# Patient Record
Sex: Male | Born: 1945
Health system: Southern US, Community
[De-identification: ages and names within clinical notes are randomized; demographics above are authoritative.]

## PROBLEM LIST (undated history)

## (undated) DIAGNOSIS — Z91199 Patient's noncompliance with other medical treatment and regimen due to unspecified reason: Secondary | ICD-10-CM

## (undated) DIAGNOSIS — K5790 Diverticulosis of intestine, part unspecified, without perforation or abscess without bleeding: Secondary | ICD-10-CM

## (undated) DIAGNOSIS — K254 Chronic or unspecified gastric ulcer with hemorrhage: Secondary | ICD-10-CM

## (undated) DIAGNOSIS — I2510 Atherosclerotic heart disease of native coronary artery without angina pectoris: Secondary | ICD-10-CM

## (undated) DIAGNOSIS — Z955 Presence of coronary angioplasty implant and graft: Secondary | ICD-10-CM

## (undated) DIAGNOSIS — E785 Hyperlipidemia, unspecified: Secondary | ICD-10-CM

## (undated) DIAGNOSIS — K922 Gastrointestinal hemorrhage, unspecified: Secondary | ICD-10-CM

## (undated) DIAGNOSIS — E119 Type 2 diabetes mellitus without complications: Secondary | ICD-10-CM

## (undated) DIAGNOSIS — D649 Anemia, unspecified: Secondary | ICD-10-CM

## (undated) DIAGNOSIS — Z9119 Patient's noncompliance with other medical treatment and regimen: Secondary | ICD-10-CM

## (undated) DIAGNOSIS — I739 Peripheral vascular disease, unspecified: Secondary | ICD-10-CM

## (undated) DIAGNOSIS — K219 Gastro-esophageal reflux disease without esophagitis: Secondary | ICD-10-CM

## (undated) DIAGNOSIS — K5520 Angiodysplasia of colon without hemorrhage: Secondary | ICD-10-CM

## (undated) DIAGNOSIS — I10 Essential (primary) hypertension: Secondary | ICD-10-CM

## (undated) DIAGNOSIS — Z9289 Personal history of other medical treatment: Secondary | ICD-10-CM

## (undated) HISTORY — DX: Presence of coronary angioplasty implant and graft: Z95.5

## (undated) HISTORY — PX: CORONARY ANGIOPLASTY: SHX604

---

## 1979-04-19 HISTORY — PX: EXCISIONAL HEMORRHOIDECTOMY: SHX1541

## 1999-04-19 DIAGNOSIS — Z9289 Personal history of other medical treatment: Secondary | ICD-10-CM

## 1999-04-19 DIAGNOSIS — K254 Chronic or unspecified gastric ulcer with hemorrhage: Secondary | ICD-10-CM

## 1999-04-19 HISTORY — DX: Personal history of other medical treatment: Z92.89

## 1999-04-19 HISTORY — DX: Chronic or unspecified gastric ulcer with hemorrhage: K25.4

## 1999-08-14 ENCOUNTER — Encounter (HOSPITAL_COMMUNITY): Admission: RE | Admit: 1999-08-14 | Discharge: 1999-11-12 | Payer: Self-pay | Admitting: *Deleted

## 1999-08-26 ENCOUNTER — Ambulatory Visit (HOSPITAL_COMMUNITY): Admission: RE | Admit: 1999-08-26 | Discharge: 1999-08-26 | Payer: Self-pay | Admitting: Internal Medicine

## 1999-08-26 ENCOUNTER — Encounter (INDEPENDENT_AMBULATORY_CARE_PROVIDER_SITE_OTHER): Payer: Self-pay

## 2002-06-13 ENCOUNTER — Encounter (INDEPENDENT_AMBULATORY_CARE_PROVIDER_SITE_OTHER): Payer: Self-pay

## 2002-06-13 ENCOUNTER — Ambulatory Visit (HOSPITAL_COMMUNITY): Admission: RE | Admit: 2002-06-13 | Discharge: 2002-06-13 | Payer: Self-pay | Admitting: *Deleted

## 2004-09-25 ENCOUNTER — Inpatient Hospital Stay (HOSPITAL_COMMUNITY): Admission: AD | Admit: 2004-09-25 | Discharge: 2004-09-27 | Payer: Self-pay | Admitting: Cardiology

## 2010-08-25 ENCOUNTER — Inpatient Hospital Stay (HOSPITAL_COMMUNITY)
Admission: EM | Admit: 2010-08-25 | Discharge: 2010-08-28 | Payer: Self-pay | Source: Home / Self Care | Attending: Internal Medicine | Admitting: Internal Medicine

## 2010-08-26 ENCOUNTER — Encounter (INDEPENDENT_AMBULATORY_CARE_PROVIDER_SITE_OTHER): Payer: Self-pay | Admitting: Internal Medicine

## 2010-08-27 ENCOUNTER — Encounter (INDEPENDENT_AMBULATORY_CARE_PROVIDER_SITE_OTHER): Payer: Self-pay | Admitting: Internal Medicine

## 2010-08-28 ENCOUNTER — Encounter (INDEPENDENT_AMBULATORY_CARE_PROVIDER_SITE_OTHER): Payer: Self-pay | Admitting: Internal Medicine

## 2010-09-02 LAB — GLUCOSE, CAPILLARY
Glucose-Capillary: 182 mg/dL — ABNORMAL HIGH (ref 70–99)
Glucose-Capillary: 265 mg/dL — ABNORMAL HIGH (ref 70–99)
Glucose-Capillary: 186 mg/dL — ABNORMAL HIGH (ref 70–99)
Glucose-Capillary: 226 mg/dL — ABNORMAL HIGH (ref 70–99)
Glucose-Capillary: 186 mg/dL — ABNORMAL HIGH (ref 70–99)
Glucose-Capillary: 205 mg/dL — ABNORMAL HIGH (ref 70–99)
Glucose-Capillary: 181 mg/dL — ABNORMAL HIGH (ref 70–99)
Glucose-Capillary: 107 mg/dL — ABNORMAL HIGH (ref 70–99)
Glucose-Capillary: 193 mg/dL — ABNORMAL HIGH (ref 70–99)
Glucose-Capillary: 202 mg/dL — ABNORMAL HIGH (ref 70–99)
Glucose-Capillary: 145 mg/dL — ABNORMAL HIGH (ref 70–99)
Glucose-Capillary: 77 mg/dL (ref 70–99)

## 2010-09-02 LAB — POCT CARDIAC MARKERS
CKMB, poc: <1 ng/mL — ABNORMAL LOW (ref 1.0–8.0)
Myoglobin, poc: 92.1 ng/mL (ref 12–200)
Troponin i, poc: <0.05 ng/mL (ref 0.00–0.09)

## 2010-09-02 LAB — CROSSMATCH
ABO/RH(D): B POS
Antibody Screen: NEGATIVE
Unit division: 0
Unit division: 0
Unit division: 0
Unit division: 0

## 2010-09-02 LAB — CBC
HCT: 16.4 % — ABNORMAL LOW (ref 39.0–52.0)
HCT: 27.8 % — ABNORMAL LOW (ref 39.0–52.0)
HCT: 25.2 % — ABNORMAL LOW (ref 39.0–52.0)
HCT: 24.8 % — ABNORMAL LOW (ref 39.0–52.0)
Hemoglobin: 5.4 g/dL — CL (ref 13.0–17.0)
Hemoglobin: 9.1 g/dL — ABNORMAL LOW (ref 13.0–17.0)
Hemoglobin: 8.2 g/dL — ABNORMAL LOW (ref 13.0–17.0)
Hemoglobin: 8.1 g/dL — ABNORMAL LOW (ref 13.0–17.0)
MCH: 33.1 pg (ref 26.0–34.0)
MCH: 30.3 pg (ref 26.0–34.0)
MCH: 30.1 pg (ref 26.0–34.0)
MCH: 30.7 pg (ref 26.0–34.0)
MCHC: 32.9 g/dL (ref 30.0–36.0)
MCHC: 32.7 g/dL (ref 30.0–36.0)
MCHC: 32.5 g/dL (ref 30.0–36.0)
MCHC: 32.7 g/dL (ref 30.0–36.0)
MCV: 100.6 fL — ABNORMAL HIGH (ref 78.0–100.0)
MCV: 92.7 fL (ref 78.0–100.0)
MCV: 92.6 fL (ref 78.0–100.0)
MCV: 93.9 fL (ref 78.0–100.0)
Platelets: 222 10*3/uL (ref 150–400)
Platelets: 215 10*3/uL (ref 150–400)
Platelets: 210 10*3/uL (ref 150–400)
Platelets: 232 10*3/uL (ref 150–400)
RBC: 1.63 MIL/uL — ABNORMAL LOW (ref 4.22–5.81)
RBC: 3 MIL/uL — ABNORMAL LOW (ref 4.22–5.81)
RBC: 2.72 MIL/uL — ABNORMAL LOW (ref 4.22–5.81)
RBC: 2.64 MIL/uL — ABNORMAL LOW (ref 4.22–5.81)
RDW: 16.4 % — ABNORMAL HIGH (ref 11.5–15.5)
RDW: 17.6 % — ABNORMAL HIGH (ref 11.5–15.5)
RDW: 17.9 % — ABNORMAL HIGH (ref 11.5–15.5)
RDW: 17.4 % — ABNORMAL HIGH (ref 11.5–15.5)
WBC: 11.5 10*3/uL — ABNORMAL HIGH (ref 4.0–10.5)
WBC: 11.1 10*3/uL — ABNORMAL HIGH (ref 4.0–10.5)
WBC: 8.6 10*3/uL (ref 4.0–10.5)
WBC: 7 10*3/uL (ref 4.0–10.5)

## 2010-09-02 LAB — FOLATE: Folate: 11.5 ng/mL

## 2010-09-02 LAB — URINALYSIS, ROUTINE W REFLEX MICROSCOPIC
Bilirubin Urine: NEGATIVE
Hgb urine dipstick: NEGATIVE
Ketones, ur: NEGATIVE mg/dL
Nitrite: NEGATIVE
Protein, ur: NEGATIVE mg/dL
Specific Gravity, Urine: 1.011 (ref 1.005–1.030)
Urine Glucose, Fasting: NEGATIVE mg/dL
Urobilinogen, UA: 0.2 mg/dL (ref 0.0–1.0)
pH: 6 (ref 5.0–8.0)

## 2010-09-02 LAB — CULTURE, BLOOD (ROUTINE X 2)
Culture  Setup Time: 201201090009
Culture  Setup Time: 201201090009
Culture: NO GROWTH
Culture: NO GROWTH

## 2010-09-02 LAB — D-DIMER, QUANTITATIVE: D-Dimer, Quant: 0.29 ug/mL-FEU (ref 0.00–0.48)

## 2010-09-02 LAB — COMPREHENSIVE METABOLIC PANEL
ALT: 14 U/L (ref 0–53)
AST: 15 U/L (ref 0–37)
Albumin: 3 g/dL — ABNORMAL LOW (ref 3.5–5.2)
Alkaline Phosphatase: 25 U/L — ABNORMAL LOW (ref 39–117)
BUN: 18 mg/dL (ref 6–23)
CO2: 29 mEq/L (ref 19–32)
Calcium: 8 mg/dL — ABNORMAL LOW (ref 8.4–10.5)
Chloride: 102 mEq/L (ref 96–112)
Creatinine, Ser: 1.14 mg/dL (ref 0.4–1.5)
GFR calc Af Amer: >60 mL/min (ref >60)
GFR calc non Af Amer: >60 mL/min (ref >60)
Glucose, Bld: 212 mg/dL — ABNORMAL HIGH (ref 70–99)
Potassium: 4 mEq/L (ref 3.5–5.1)
Sodium: 136 mEq/L (ref 135–145)
Total Bilirubin: 0.9 mg/dL (ref 0.3–1.2)
Total Protein: 5.5 g/dL — ABNORMAL LOW (ref 6.0–8.3)

## 2010-09-02 LAB — PROTIME-INR
INR: 1.1 (ref 0.00–1.49)
Prothrombin Time: 14.4 seconds (ref 11.6–15.2)

## 2010-09-02 LAB — BASIC METABOLIC PANEL
BUN: 15 mg/dL (ref 6–23)
BUN: 17 mg/dL (ref 6–23)
CO2: 25 mEq/L (ref 19–32)
CO2: 29 mEq/L (ref 19–32)
Calcium: 8.6 mg/dL (ref 8.4–10.5)
Calcium: 8.5 mg/dL (ref 8.4–10.5)
Chloride: 102 mEq/L (ref 96–112)
Chloride: 105 mEq/L (ref 96–112)
Creatinine, Ser: 1.15 mg/dL (ref 0.4–1.5)
Creatinine, Ser: 1.15 mg/dL (ref 0.4–1.5)
GFR calc Af Amer: >60 mL/min (ref >60)
GFR calc Af Amer: >60 mL/min (ref >60)
GFR calc non Af Amer: >60 mL/min (ref >60)
GFR calc non Af Amer: >60 mL/min (ref >60)
Glucose, Bld: 315 mg/dL — ABNORMAL HIGH (ref 70–99)
Glucose, Bld: 186 mg/dL — ABNORMAL HIGH (ref 70–99)
Potassium: 4.2 mEq/L (ref 3.5–5.1)
Potassium: 4.2 mEq/L (ref 3.5–5.1)
Sodium: 136 mEq/L (ref 135–145)
Sodium: 140 mEq/L (ref 135–145)

## 2010-09-02 LAB — HEMOGLOBIN A1C
Hgb A1c MFr Bld: 7 % — ABNORMAL HIGH (ref <5.7)
Mean Plasma Glucose: 154 mg/dL — ABNORMAL HIGH (ref <117)

## 2010-09-02 LAB — URINE CULTURE
Colony Count: NO GROWTH
Culture  Setup Time: 201201091229
Culture: NO GROWTH

## 2010-09-02 LAB — CK TOTAL AND CKMB (NOT AT ARMC)
CK, MB: 1.5 ng/mL (ref 0.3–4.0)
CK, MB: 1 ng/mL (ref 0.3–4.0)
CK, MB: 1.1 ng/mL (ref 0.3–4.0)
CK, MB: 1.3 ng/mL (ref 0.3–4.0)
CK, MB: 1.2 ng/mL (ref 0.3–4.0)
Relative Index: 1.4 (ref 0.0–2.5)
Relative Index: 0.8 (ref 0.0–2.5)
Relative Index: 0.9 (ref 0.0–2.5)
Relative Index: 1 (ref 0.0–2.5)
Relative Index: 1 (ref 0.0–2.5)
Total CK: 106 U/L (ref 7–232)
Total CK: 124 U/L (ref 7–232)
Total CK: 124 U/L (ref 7–232)
Total CK: 133 U/L (ref 7–232)
Total CK: 124 U/L (ref 7–232)

## 2010-09-02 LAB — ABO/RH: ABO/RH(D): B POS

## 2010-09-02 LAB — IRON AND TIBC
Iron: 24 ug/dL — ABNORMAL LOW (ref 42–135)
Saturation Ratios: 7 % — ABNORMAL LOW (ref 20–55)
TIBC: 334 ug/dL (ref 215–435)
UIBC: 310 ug/dL

## 2010-09-02 LAB — BRAIN NATRIURETIC PEPTIDE: Pro B Natriuretic peptide (BNP): <30 pg/mL (ref 0.0–100.0)

## 2010-09-02 LAB — LIPID PANEL
Cholesterol: 119 mg/dL (ref 0–200)
HDL: 29 mg/dL — ABNORMAL LOW (ref >39)
LDL Cholesterol: 60 mg/dL (ref 0–99)
Total CHOL/HDL Ratio: 4.1 RATIO
Triglycerides: 148 mg/dL (ref <150)
VLDL: 30 mg/dL (ref 0–40)

## 2010-09-02 LAB — TROPONIN I
Troponin I: 0.02 ng/mL (ref 0.00–0.06)
Troponin I: 0.02 ng/mL (ref 0.00–0.06)
Troponin I: 0.01 ng/mL (ref 0.00–0.06)
Troponin I: 0.02 ng/mL (ref 0.00–0.06)
Troponin I: 0.02 ng/mL (ref 0.00–0.06)

## 2010-09-02 LAB — VITAMIN B12: Vitamin B-12: 261 pg/mL (ref 211–911)

## 2010-09-02 LAB — RAPID URINE DRUG SCREEN, HOSP PERFORMED
Amphetamines: NOT DETECTED
Barbiturates: NOT DETECTED
Benzodiazepines: NOT DETECTED
Cocaine: NOT DETECTED
Opiates: NOT DETECTED
Tetrahydrocannabinol: NOT DETECTED

## 2010-09-02 LAB — TSH: TSH: 0.995 u[IU]/mL (ref 0.350–4.500)

## 2010-09-02 LAB — FERRITIN: Ferritin: 21 ng/mL — ABNORMAL LOW (ref 22–322)

## 2010-09-08 NOTE — Consult Note (Signed)
NAMEADEL, BURCH              ACCOUNT NO.:  [id number]  MEDICAL RECORD NO.:  [id number]          PATIENT TYPE:  INP  LOCATION:  1437                         FACILITY:  [facility]  PHYSICIAN:  Willis Modena, MD     DATE OF BIRTH:  1946/07/14  DATE OF CONSULTATION:  08/27/2010 DATE OF DISCHARGE:                                CONSULTATION   REASON FOR CONSULTATION:  Melena and anemia.  CONSULTING PHYSICIAN:  Conley Canal, MD  CHIEF COMPLAINT:  Shortness of breath.  HISTORY OF PRESENT ILLNESS:  Mr. Selmer is a 65 year old gentleman with multiple medical problems.  For the past few weeks, he has had trouble with progressive weakness, fatigue and shortness of breath.  He also has endorsed black stools over this time frame.  He does use NSAIDs regularly.  He was found to have a hemoglobin of 5.4 which improved to about 8 after blood transfusions.  He did have an endoscopy done in 2003 for melena by Dr. Virginia Rochester, which showed some gastric ulcerations, biopsies of which showed no malignancy or H pylori.  He also endorses having a colonoscopy several years ago, but I cannot find any records of that in our hospital electronic medical record.  Mr. Odor does have chronic constipation and has had no change in his bowel habits.  He has unintentionally lost about 20 pounds over the past couple of months.  He denies any nausea, vomiting or dysphagia.  Past medical history, past surgical history, allergies, medications, family history, social history and review of systems all from dictated note from Dr. Venetia Constable, dictated on August 25, 2010.  I have reviewed and I agree.  PHYSICAL EXAMINATION:  VITAL SIGNS:  Temperature 98.4, heart rate 74, respiratory rate 18, blood pressure 105/63, oxygen saturation 96% on room air. GENERAL:  Mr. Coudriet is in no acute distress, nontoxic appearing. EYES:  Sclerae are anicteric.  Conjunctivae are slightly pale. ENT:  Slightly dry mucous membranes.  No  oropharyngeal lesions. NECK:  Supple without thyromegaly, lymphadenopathy or bruits. LUNGS:  Clear to auscultation without rales, rhonchi or wheeze. HEART:  Regular rhythm.  Normal rate without murmur, rub or gallop. ABDOMEN:  Soft, nontender and nondistended with normoactive bowel sounds.  No liver or splenic enlargement.  No bulging flanks to suggest ascites. NEUROLOGIC:  Diffusely weak and nonfocal without lateralizing signs. SKIN:  No rash or ecchymoses. EXTREMITIES:  There is no peripheral cyanosis, clubbing or edema. LYMPHATICS:  No palpable axillary, submandibular or supraclavicular adenopathy. PSYCHIATRIC:  Normal mood and affect.  LABORATORY STUDIES:  Hemoglobin on admission was 5.4 with an MCV of 100.6.  Current hemoglobin 8.2, white count 8.6, platelet count 210. INR 1.1.  Sodium 140, potassium 4.2, chloride 105, bicarb 29, BUN 17, creatinine 1.15.  Total protein is 5.5, albumin 3.0, total bilirubin 0.9, alk phos 25, AST 15, ALT 14.  Cardiac enzymes negative on multiple levels.  Urinalysis negative.  Ferritin, iron and iron percent saturation are low.  RADIOLOGIC STUDIES:  Chest x-ray done in January shows no acute cardiopulmonary disease.  IMPRESSION:  Mr. Cinquemani is a 64-year gentleman presenting with melena and symptomatic anemia.  He  does use NSAIDs regularly.  Overall constellation of findings is supportive of peptic ulcer disease. However, the presence of unintentional weight loss raises possibility of some type of malignancy.  PLAN: 1. I agree with medical management with IV fluids and proton pump     inhibitor therapy. 2. Proceed with endoscopy today. 3. If endoscopy is unremarkable, he will need a colonoscopy tomorrow.  Thanks again for allowing [place] to participate in Mr. Levick care.     Willis Modena, MD     WO/MEDQ  D:  08/27/2010  T:  08/27/2010  Job:  [id number]  Electronically Signed by Willis Modena  on 09/08/2010 03:27:46 PM

## 2010-09-14 NOTE — Discharge Summary (Signed)
[name], [name]              ACCOUNT NO.:  [id number]  MEDICAL RECORD NO.:  [id number]          PATIENT TYPE:  INP  LOCATION:  1437                         FACILITY:  [facility]  PHYSICIAN:  Calvert Cantor, M.D.     DATE OF BIRTH:  06-15-1946  DATE OF ADMISSION:  08/25/2010 DATE OF DISCHARGE:  08/28/2010                              DISCHARGE SUMMARY   PRIMARY CARE PHYSICIAN:  None.  The patient goes to Urgent Care for his needs.  PRESENTING COMPLAINT:  Shortness of breath on exertion.  DISCHARGE DIAGNOSES: 1. Severe anemia. 2. Peptic ulcer disease, likely secondary to nonsteroidal anti-     inflammatory drugs. 3. Diabetes mellitus type 2.  Patient does not take any medication for     this. 4. Tobacco abuse. 5. History of hypertension in the past. 6. Iron deficiency secondary to chronic gastrointestinal bleed from     peptic ulcers.  DISCHARGE MEDICATIONS: 1. Ferrous sulfate 325 mg b.i.d. 2. Glucophage 500 mg twice a day. 3. Protonix 40 mg daily.  HOSPITAL COURSE:  This is a 65 year old who presented to the ER with a complaint of shortness of breath, who was found to be significantly anemic, with a hemoglobin of 5.4.  Patient was transfused, bringing his hemoglobin up to 9.1.  On discharge today, it is 8.1.  For further workup, he had a GI consult and upper endoscopy was done on August 27, 2010, which revealed multiple erosions in the antrum greater curvature and the proximal stomach.  He also had minimal bulbitis.  No blood was seen.  A colonoscopy was performed today with biopsies.  He was noted to have internal, external hemorrhoids.  No bleeding was noted.  He had a few right-sided diverticula.  He had rectal and distal ascending small polyps, which were biopsied.  Patient is very adamant about going home today.  He is agreeable to take Protonix.  He states he has been taking one iron tablet a day, but is now recommended to increase this to twice a day.  I have  recommended and IV iron infusion prior to discharge, but the patient his declining this and states that he will take his iron appropriately.  I have advised him that he needs to stop all NSAIDs.  Dr. Ewing Schlein has advised him of this as well, and patient has written directions being given to him on discharge.  Patient is recommended to follow up with Dr. Ewing Schlein in 1 month.  Of note, the patient does not have a PCP.  I have recommended that he find one, since he is a diabetic, hypertensive and has a problem with chronic anemia.  Patient's diabetes was noted to be uncontrolled.  He states he does not check his sugars at home and does not take medication for it.  His hemoglobin A1c was found to be 7.0.  He has been receiving insulin along with Glucophage, while in the hospital.  He is being discharged on Glucophage.  He is advised to take a diabetic diet and continue to take his medicine and start checking his sugars.  Patient was on antihypertensives during the hospital stay.  However,  when trending his blood pressures, it seems that he has been more hypotensive than hypertensive, and therefore at this point, I am not discharging him on any blood pressure medication.  I am recommending that he follow up with an outpatient physician and have his blood pressure checked regularly and medication started, if needed.  Further blood work included an iron profile.  He was found to have an iron level of 24, percent saturation was 7.  Ferritin level 21.  Again, patient is refusing IV iron infusion.  Patient also had a lipid profile and HDL is low at 29.  His LDL is 68, cholesterol level is 119.  PHYSICAL EXAMINATION:  ABDOMEN:  Soft, nontender, nondistended.  Bowel sounds positive. LUNGS:  Clear bilaterally. HEART: Regular rate and rhythm.  No murmurs.  Of note, the patient was found to have a moderate atrial septal aneurysm on the 2-D echo that was performed during this hospital stay.   Dr. Venetia Constable, who was following him at the time, did discuss this with the on- call cardiologist, who stated that, at this time, there is nothing that needs to be done for this aneurysm.  It appears that Dr. Venetia Constable spoke with Dr. Lynnea Ferrier.  Patient has been advised to discontinue smoking as well.  FOLLOWUP INSTRUCTIONS:  He is strongly advised to find a primary care physician.  He also needs to follow up with Dr. Ewing Schlein in 1 month.  CONDITION ON DISCHARGE:  Stable.  Please mention echo findings, EF 55% to 60% with no wall motion abnormality.  There was increased relative contribution of atrial contraction to ventricular filling, ascending aorta was mildly dilated, atrial septum revealed a medium sized aneurysm with a free respirophasic mobility between right and left atrial cavities.  Inferior vena cava was noted to be mildly dilated.  Time on discharge was 55 minutes.     Calvert Cantor, M.D.     SR/MEDQ  D:  08/28/2010  T:  08/28/2010  Job:  [id number]  Electronically Signed by Calvert Cantor M.D. on 09/14/2010 02:36:05 PM

## 2011-01-03 NOTE — Cardiovascular Report (Signed)
[name], [name]              ACCOUNT NO.:  [id number]   MEDICAL RECORD NO.:  [id number]          PATIENT TYPE:  INP   LOCATION:  2013                         FACILITY:  [facility]   PHYSICIAN:  Madaline Savage, M.D.DATE OF BIRTH:  04-Nov-1945   DATE OF PROCEDURE:  09/25/2004  DATE OF DISCHARGE:                              CARDIAC CATHETERIZATION   PROCEDURES PERFORMED:  1.  Selective coronary angiography by Judkins technique.  2.  Retrograde left heart catheterization.  3.  Left ventricular angiography.  4.  Abdominal aortography.   COMPLICATIONS:  None.   ENTRY SITE:  Right femoral.   DYE USED:  Omnipaque.   CATHETERS USED:  6-French Judkins diagnostic catheters by [employer]   PATIENT PROFILE:  The patient is a 65 year old African-American gentleman  who presented to an urgent care center yesterday with complaints of chest  pain. His blood pressure was elevated and because of a positive response to  sublingual nitroglycerin, the patient was then sent to our office for  further evaluation. The EKG showed showed what appeared to be a QS pattern  in lead V2 which was consistent with a possible old anteroseptal MI but  there was no evidence of acute ischemia. Because of the patient's family  history of stroke and his elevated blood pressure and his chest pain  response to nitroglycerin, we transferred him to [facility] yesterday where he was  placed on intravenous medication and was brought to the catheterization  laboratory electively this morning.   RESULTS:  PRESSURES: The left ventricular pressure was 140/14, end-diastolic  pressure 17. Central aortic pressure 140/80, mean of 110. No aortic valve  gradient by pullback technique.   ANGIOGRAPHIC RESULTS:  The patient had a fairly long left main coronary  artery large in diameter and with no lesions seen.   The LAD coursed to the cardiac apex and gave rise to a single, fairly large  diagonal branch and a normal LAD as well.   There was an intermediate ramus branch off the left main coronary artery  moderate in size and normal in appearance.   The circumflex coronary artery bifurcated distally and was nondominant and  showed no lesions.   The right coronary artery was large and dominant with a single acute  marginal branch and a posterolateral and a posterior descending branch that  were both fairly large with no lesions noted.   Left ventricular angiography showed normal contractility without wall  segment abnormalities. Ejection fraction estimate 50-60% and no evidence of  mitral regurgitation.   Abdominal aortography showed no evidence of renal artery stenosis and the  abdominal aorta was normal.   FINAL IMPRESSIONS:  1.  Recent chest pain of anginal quality relieved by nitroglycerin.  2.  Elevated blood pressure without adequate control.  3.  Angiographically patent coronary arteries with a right coronary dominant      system.  4.  Normal left ventricular systolic function, ejection fraction 50-60%.  5.  Normal renal arteries.   PLAN:  The patient will be scheduled for a CT of the chest to look for  aortic dissection and we will  continue to optimize his blood pressure for  better hypertensive control.      WHG/MEDQ  D:  09/26/2004  T:  09/26/2004  Job:  [id number]   cc:   Cath Lab

## 2011-01-03 NOTE — Discharge Summary (Signed)
NAMEWILBORN, MEMBRENO              ACCOUNT NO.:  [id number]   MEDICAL RECORD NO.:  [id number]          PATIENT TYPE:  INP   LOCATION:  2013                         FACILITY:  [facility]   PHYSICIAN:  Madaline Savage, M.D.DATE OF BIRTH:  [birth year]   DATE OF ADMISSION:  09/25/2004  DATE OF DISCHARGE:  09/27/2004                                 DISCHARGE SUMMARY   DISCHARGE DIAGNOSIS:  1.  Chest pain consistent with unstable angina, catheterization this      admission revealing normal coronaries and normal left ventricle      function.  2.  Hypertension.  3.  History of a gastrointestinal bleeding in the past.   HOSPITAL COURSE:  The patient is a 65 year old male who was referred to [place]  by [facility] for evaluation of chest pain.  He has not had prior  coronary disease.  He developed chest tightness across his chest at work.  He was referred to [place] from [facility].  We saw him in the office.  He did receive a nitroglycerin at [facility] with relief of his  symptoms.  He was admitted to telemetry and set up for diagnostic  catheterization.  The patient was started on heparin and nitrates.  Enzymes  were negative.  Catheterization, done September 26, 2004, showed normal LV  function, normal coronaries, normal renal arteries, and normal iliacs.  Avapro was added as well as beta-blocker for his hypertension.  He did have  a spiral CT, prior to discharge, that was negative for pulmonary embolism.  We feel he can be discharged September 27, 2004.  He will follow up with Dr.  Elsie Lincoln.   DISCHARGE MEDICATIONS:  1.  Avapro 150 mg a day.  2.  Metoprolol 50 mg b.i.d.  3.  Baby aspirin q.d.  4.  Prilosec over-the-counter once a day.  5.  Iron as taken at home.   LABS:  White count 4.3, hemoglobin 11.4 hematocrit 35.2, platelets 369.  Sodium 135, potassium 4.0, BUN 19, creatinine 1.2. TSH was normal.  CK-MB  and troponins were negative.  Liver functions were normal.  A lipid  profile  revealed cholesterol 160, triglycerides 125, HDL 38, LDL 97.  Chest x-ray  shows clear lung fields, ectatic aorta.  EKG shows sinus rhythm without  acute changes.   DISPOSITION:  The patient discharged in stable condition and will follow-up  with Dr. Elsie Lincoln as an outpatient.      LKK/MEDQ  D:  09/27/2004  T:  09/27/2004  Job:  [id number]   cc:   [facility]

## 2011-01-03 NOTE — Op Note (Signed)
   NAMEHANNA, [name]                          ACCOUNT NO.:  [id number]   MEDICAL RECORD NO.:  [id number]                   PATIENT TYPE:  AMB   LOCATION:  ENDO                                 FACILITY:  [facility]   PHYSICIAN:  Georgiana Spinner, M.D.                 DATE OF BIRTH:  08/04/1946   DATE OF PROCEDURE:  DATE OF DISCHARGE:                                 OPERATIVE REPORT   PROCEDURE:  Upper endoscopy.   INDICATIONS FOR PROCEDURE:  Patient presenting with melena and NSAID use  presumably has NSAID induced ulceration.   ANESTHESIA:  Demerol 60, Versed 6 mg.   DESCRIPTION OF PROCEDURE:  With the patient mildly sedated in the left  lateral decubitus position, the Olympus videoscopic endoscope was inserted  in the mouth and passed under direct vision through the esophagus which  appeared normal. We advanced into the stomach. The fundus and body appeared  normal. The antrum showed ulcerations in the prepyloric area which were  photographed and biopsied. They appeared to be benign. We entered into the  duodenal bulb and second portion of the duodenum. Mild duodenitis was seen  in the bulb only. From this point, the endoscope was slowly withdrawn taking  circumferential views of the entire duodenal mucosa until the endoscope was  pulled back into the stomach, placed in retroflexion to view the stomach  from below. The endoscope was then straightened and withdrawn taking  circumferential views of the remaining gastric and esophageal mucosa. The  patient's vital signs and pulse oximeter remained stable. The patient  tolerated the procedure well without apparent complications.   FINDINGS:  Ulceration of antrum of stomach. Await biopsy report. The patient  will call me for results and followup with me as an outpatient. Continue  PPI, avoidance of NSAIDs for the time being.                                                Georgiana Spinner, M.D.    GMO/MEDQ  D:  06/13/2002  T:   06/13/2002  Job:  [id number]   cc:   Harrel Lemon. Merla Riches, M.D.  [address]  [place]  [place] [address]  Fax: [phone]

## 2011-11-11 ENCOUNTER — Ambulatory Visit (INDEPENDENT_AMBULATORY_CARE_PROVIDER_SITE_OTHER): Payer: Medicare Other | Admitting: Family Medicine

## 2011-11-11 ENCOUNTER — Ambulatory Visit: Payer: Medicare Other

## 2011-11-11 VITALS — BP 157/83 | HR 78 | Temp 98.8°F | Resp 18 | Ht 68.0 in | Wt 206.8 lb

## 2011-11-11 DIAGNOSIS — I10 Essential (primary) hypertension: Secondary | ICD-10-CM

## 2011-11-11 DIAGNOSIS — M546 Pain in thoracic spine: Secondary | ICD-10-CM | POA: Diagnosis not present

## 2011-11-11 DIAGNOSIS — IMO0001 Reserved for inherently not codable concepts without codable children: Secondary | ICD-10-CM | POA: Diagnosis not present

## 2011-11-11 DIAGNOSIS — D649 Anemia, unspecified: Secondary | ICD-10-CM

## 2011-11-11 DIAGNOSIS — K273 Acute peptic ulcer, site unspecified, without hemorrhage or perforation: Secondary | ICD-10-CM | POA: Diagnosis not present

## 2011-11-11 DIAGNOSIS — E119 Type 2 diabetes mellitus without complications: Secondary | ICD-10-CM

## 2011-11-11 DIAGNOSIS — K279 Peptic ulcer, site unspecified, unspecified as acute or chronic, without hemorrhage or perforation: Secondary | ICD-10-CM

## 2011-11-11 LAB — POCT CBC
Granulocyte percent: 69.7 %G (ref 37–80)
HCT, POC: 35.1 % — AB (ref 43.5–53.7)
Hemoglobin: 11.1 g/dL — AB (ref 14.1–18.1)
Lymph, poc: 1.9 (ref 0.6–3.4)
MCH, POC: 29.8 pg (ref 27–31.2)
MCHC: 31.6 g/dL — AB (ref 31.8–35.4)
MCV: 94.1 fL (ref 80–97)
MID (cbc): 0.5 (ref 0–0.9)
MPV: 8.7 fL (ref 0–99.8)
POC Granulocyte: 5.5 (ref 2–6.9)
POC LYMPH PERCENT: 24.2 %L (ref 10–50)
POC MID %: 6.1 %M (ref 0–12)
Platelet Count, POC: 331 10*3/uL (ref 142–424)
RBC: 3.73 M/uL — AB (ref 4.69–6.13)
RDW, POC: 17.6 %
WBC: 7.9 10*3/uL (ref 4.6–10.2)

## 2011-11-11 LAB — BASIC METABOLIC PANEL
BUN: 20 mg/dL (ref 6–23)
CO2: 30 mEq/L (ref 19–32)
Calcium: 9.4 mg/dL (ref 8.4–10.5)
Chloride: 102 mEq/L (ref 96–112)
Creat: 1.17 mg/dL (ref 0.50–1.35)
Glucose, Bld: 234 mg/dL — ABNORMAL HIGH (ref 70–99)
Potassium: 4.7 mEq/L (ref 3.5–5.3)
Sodium: 137 mEq/L (ref 135–145)

## 2011-11-11 LAB — POCT GLYCOSYLATED HEMOGLOBIN (HGB A1C): Hemoglobin A1C: 7.9

## 2011-11-11 LAB — GLUCOSE, POCT (MANUAL RESULT ENTRY): POC Glucose: 240

## 2011-11-11 MED ORDER — LISINOPRIL 5 MG PO TABS: 5.0000 mg | ORAL_TABLET | Freq: Every day | ORAL | Status: DC

## 2011-11-11 MED ORDER — METFORMIN HCL 500 MG PO TABS: 500.0000 mg | ORAL_TABLET | Freq: Every day | ORAL | Status: DC

## 2011-11-11 MED ORDER — OMEPRAZOLE 40 MG PO CPDR: 40.0000 mg | DELAYED_RELEASE_CAPSULE | Freq: Every day | ORAL | Status: DC

## 2011-11-11 MED ORDER — TRAMADOL HCL 50 MG PO TABS: 50.0000 mg | ORAL_TABLET | Freq: Four times a day (QID) | ORAL | Status: AC | PRN

## 2011-11-11 NOTE — Progress Notes (Signed)
Subjective:    Patient ID: [name], male    DOB: 03-02-1946, 66 y.o.   MRN: [id number]  HPI [name] is a 66 y.o. male First ov back here since 2009.  Reestablishing care.  Hospitalized 1/8-1/11/12 for anemia (HGb 5.4), PUD and GI bleed with use of NSAIDS - see hospital d/c summary - instructed there not to use nsaids.  patient aware of risks including death form bleeding, but takes about twice per day for back pain.  Does not get relief from tylenol.  NKI - just sore in back.  No incontinence.  No leg radiation.  Pain in middle back.  Iron once per day, but sometimes twice per day.  BP up and down in hospital, but not restarted on meds.  Did not follow up with gastroenterologist - unknown reason, but would like to be referred.  DM2 - no recent meds,  A1c 7.0 in hospital, has meter at home, but no testing strips.  Contour meter.  Did take glucophage when discharged from hospital, but no recent meds.  Smokes cigars - 4-5/day.  Review of Systems  Respiratory: Negative for chest tightness and shortness of breath.   Cardiovascular: Negative for chest pain.  Gastrointestinal: Negative for blood in stool.       Dark stools at times - 1-2 times per week or 1x/month.  Genitourinary: Negative for hematuria and difficulty urinating.  Musculoskeletal: Positive for back pain.  Neurological: Negative for dizziness and light-headedness.       Objective:   Physical Exam  Constitutional: He appears well-developed and well-nourished.  HENT:  Head: Normocephalic and atraumatic.  Cardiovascular: Normal rate, normal heart sounds and intact distal pulses.   No murmur heard. Pulmonary/Chest: Effort normal and breath sounds normal.  Abdominal: Soft. Bowel sounds are normal. He exhibits no distension. There is no tenderness. There is no guarding.  Musculoskeletal:       Thoracic back: He exhibits tenderness and spasm. He exhibits no bony tenderness.       Back:    Results for orders  placed in visit on 11/11/11  POCT CBC      Component Value Range   WBC 7.9  4.6 - 10.2 (K/uL)   Lymph, poc 1.9  0.6 - 3.4    POC LYMPH PERCENT 24.2  10 - 50 (%L)   MID (cbc) 0.5  0 - 0.9    POC MID % 6.1  0 - 12 (%M)   POC Granulocyte 5.5  2 - 6.9    Granulocyte percent 69.7  37 - 80 (%G)   RBC 3.73 (*) 4.69 - 6.13 (M/uL)   Hemoglobin 11.1 (*) 14.1 - 18.1 (g/dL)   HCT, POC 40.9 (*) 81.1 - 53.7 (%)   MCV 94.1  80 - 97 (fL)   MCH, POC 29.8  27 - 31.2 (pg)   MCHC 31.6 (*) 31.8 - 35.4 (g/dL)   RDW, POC 91.4     Platelet Count, POC 331  142 - 424 (K/uL)   MPV 8.7  0 - 99.8 (fL)  POCT GLYCOSYLATED HEMOGLOBIN (HGB A1C)      Component Value Range   Hemoglobin A1C 7.9    GLUCOSE, POCT (MANUAL RESULT ENTRY)      Component Value Range   POC Glucose 240     [facility] reading (PRIMARY) by  Dr. Neva Seat: T Spine: slight curvature, o/w NAD.      Assessment & Plan:  [name] is a 66 y.o. male 1. PUD (peptic ulcer  disease)  POCT CBC  2. DM2 (diabetes mellitus, type 2)  POCT glycosylated hemoglobin (Hb A1C), POCT glucose (manual entry)  3. Thoracic back pain  DG Thoracic Spine 2 View  4. HTN (hypertension)  Basic metabolic panel  5. Anemia     Uncontrolled DM - non adherent to meds, follow up.  Discussed with patient in detail risks of nonadherence and uncontrolled diabetes including, but not limited to CAD, renal disease and dialysis, PVD/neuropathy including amputations and death.  Understanding expressed. Will restart metformin at 559m QD for now, check home CBG's atleast once per day (Rx for lancets, testing strips written), and recheck with readings in next 1 month.  Diet control discussed.  HTN - slowly start meds, and orthostatic precautions reveiwed, especially with hx anemia. Start lisinopril 5mg  QD.  Check BMP.  Anemia with hx PUD, and persistent use of NSAIDS, with reported episodic dark stools.  Hgb improved today form hospitalization - continue iron QD, and will refer to Dr.  Ewing Schlein for follow up.  Stressed importance of keeping appt.  I also again stressed absolute importance of avoidance of NSAIDS.  Risks of PUD and GI bleeding reviewed including but not limited to rebleed, worsening anemia and possible death - understanding expressed. Will start omeprazole due to cost of protonix.  No aspirin until OV with GI to discuss.  Mid back pain - likely component of scoliosis with muscle spasm.  Must avoid NSAIDS.  Reported insufficient relief with tylenol.  Trial of Ultram q6h prn short term, heat and stretches as needed per back care manual, then follow up in next 2-4 weeks to further discuss and eval.   Recheck in next 2-4 weeks. , sooner if worse.

## 2011-11-11 NOTE — Patient Instructions (Signed)
restart metformin at 523m 1 per day for now, check home blood sugars atleast once per day, and recheck with readings in next 1 month.  Diet control as discussed.   Start lisinopril 5mg  each day  - if any dizziness/lightheadedness - return to clinic.  Will refer to Dr. Ewing Schlein for follow up of peptic ulcer disease and anemia.  Absolutely avoid all NSAIDS. Will start omeprazole due to cost of protonix.  No aspirin until OV with GI to discuss.  Mid back pain - likely component of scoliosis with muscle spasm.  Must avoid NSAIDS.   Trial of Ultram short term, heat and stretches as needed per back care manual, then follow up in next 2-4 weeks to further discuss and evaluate.  Recheck in next 2-4 weeks. , sooner if worse.  Diabetes, Type 2 Diabetes is a long-lasting (chronic) disease. In type 2 diabetes, the pancreas does not make enough insulin (a hormone), and the body does not respond normally to the insulin that is made. This type of diabetes was also previously called adult-onset diabetes. It usually occurs after the age of 43, but it can occur at any age.  CAUSES  Type 2 diabetes happens because the pancreasis not making enough insulin or your body has trouble using the insulin that your pancreas does make properly. SYMPTOMS   Drinking more than usual.   Urinating more than usual.   Blurred vision.   Dry, itchy skin.   Frequent infections.   Feeling more tired than usual (fatigue).  DIAGNOSIS The diagnosis of type 2 diabetes is usually made by one of the following tests:  Fasting blood glucose test. You will not eat for at least 8 hours and then take a blood test.   Random blood glucose test. Your blood glucose (sugar) is checked at any time of the day regardless of when you ate.   Oral glucose tolerance test (OGTT). Your blood glucose is measured after you have not eaten (fasted) and then after you drink a glucose containing beverage.  TREATMENT   Healthy eating.   Exercise.     Medicine, if needed.   Monitoring blood glucose.   Seeing your caregiver regularly.  HOME CARE INSTRUCTIONS   Check your blood glucose at least once a day. More frequent monitoring may be necessary, depending on your medicines and on how well your diabetes is controlled. Your caregiver will advise you.   Take your medicine as directed by your caregiver.   Do not smoke.   Make wise food choices. Ask your caregiver for information. Weight loss can improve your diabetes.   Learn about low blood glucose (hypoglycemia) and how to treat it.   Get your eyes checked regularly.   Have a yearly physical exam. Have your blood pressure checked and your blood and urine tested.   Wear a pendant or bracelet saying that you have diabetes.   Check your feet every night for cuts, sores, blisters, and redness. Let your caregiver know if you have any problems.  SEEK MEDICAL CARE IF:   You have problems keeping your blood glucose in target range.   You have problems with your medicines.   You have symptoms of an illness that do not improve after 24 hours.   You have a sore or wound that is not healing.   You notice a change in vision or a new problem with your vision.   You have a fever.  MAKE SURE YOU:  Understand these instructions.   Will  watch your condition.   Will get help right away if you are not doing well or get worse.  Document Released: 08/04/2005 Document Revised: 07/24/2011 Document Reviewed: 01/20/2011 [facility] Patient Information 2012 [place], [place].  Ulcer Disease You have an ulcer. This may be in your stomach (gastric ulcer) or in the first part of your small bowel, the duodenum (duodenal ulcer). An ulcer is a break in the lining of the stomach or duodenum. The ulcer causes erosion into the deeper tissue. CAUSES  The stomach has a lining to protect itself from the acid that digests food. The lining can be damaged in two main ways:  The Helico Pylori bacteria (H.  Pyolori) can infect the lining of the stomach and cause ulcers.   Nonsteroidal, anti-inflammatory medications (NSAIDS) can cause gastric ulcerations.   Smoking tobacco can increase the acid in the stomach. This can lead to ulcers, and will impair healing of ulcers.  Other factors, such as alcohol use and stress may contribute to ulcer formation. Rarely, a tumor or cancer can cause an ulcer.  SYMPTOMS  The problems (symptoms) of ulcer disease are usually a burning or gnawing of the mid-upper belly (abdomen). This is often worse on an empty stomach and may get better with food. This may be associated with feeling sick to your stomach (nausea), bloating, and vomiting. If the ulcer results in bleeding, it can cause:  Black, tarry stools.   Vomiting of bright red blood.   Vomiting coffee-ground-looking materials.  With severe bleeding, there may be loss of consciousness and shock.  DIAGNOSIS  Learning what is wrong (diagnosis) is usually made based upon your history and an exam. Medications are so effective that further tests may not be necessary. If needed, other tests may include:  Blood tests, x-rays, or heart tests. These are done to be sure that no other conditions are causing your symptoms.   X-rays (Barium studies) such as an upper GI series.   Most commonly, an upper GI endoscopy can confirm your diagnosis. This is when a flexible tube is passed through the mouth (using sedative medication). The tube is used to look at the inside of esophagus, stomach, and small bowel. Abnormal pieces of tissue may be removed to examine under the microscope (biopsy).  TREATMENT  Bleeding from ulcers can usually be treated via endoscopy. Rarely, surgery is needed for ulcers when bleeding cannot be stopped. Surgery is needed if the ulcer goes through the wall of the stomach or duodenum.  After any bleeding is stopped, medications are the main treatment:  If your ulcer was caused by the bacteria H. Pylori,  then you will need antibiotics to kill the infection. Usually, a program involving more than one antibiotic, and a medicine for stomach acid, is prescribed.   Medicine to decrease acid production is used for almost all ulcers. Your caregiver will make a recommendation. They will also tell you how long you must use the medication.   Stop using any medications or substances that may have contributed to your ulcer (alcohol, tobacco, caffeine, for example).   Medications are available that protect the lining of the bowel.  HOME CARE INSTRUCTIONS   Continue regular work and usual activities unless advised otherwise by your caregiver.   Avoid tobacco, alcohol, and caffeine. Tobacco use will decrease and slow the rates of healing.   Avoid foods that seem to aggravate or cause discomfort.   There are many over-the-counter products available to control stomach acid and other symptoms. Discuss these  with your caregiver before using them. DO NOT substitute over-the-counter medications for prescription medications without discussing with your caregiver.   Special diets are not usually needed.   Keep any follow-up appointments and blood tests, as directed.  SEEK MEDICAL CARE IF:   Your pain or other ulcer symptoms do not improve within a few days of starting treatment.   You develop diarrhea. This can be a complication of certain treatments.   You have ongoing indigestion or heartburn, even if your main ulcer symptoms are improved.  SEEK IMMEDIATE MEDICAL CARE IF:   You develop bright red, rectal bleeding; dark black, tarry stools; or vomit blood.   You become light-headed, weak, have fainting episodes, or become sweaty, cold and clammy.   You experience severe abdominal pain not controlled by medications. DO NOT take pain medications unless ordered by your caregiver.  Document Released: 05/14/2005 Document Revised: 07/24/2011 Document Reviewed: 04/01/2007 [facility] Patient Information 2012  [place], [place].

## 2011-11-24 DIAGNOSIS — R195 Other fecal abnormalities: Secondary | ICD-10-CM | POA: Diagnosis not present

## 2011-11-24 DIAGNOSIS — D500 Iron deficiency anemia secondary to blood loss (chronic): Secondary | ICD-10-CM | POA: Diagnosis not present

## 2011-11-24 DIAGNOSIS — K625 Hemorrhage of anus and rectum: Secondary | ICD-10-CM | POA: Diagnosis not present

## 2011-11-24 DIAGNOSIS — E119 Type 2 diabetes mellitus without complications: Secondary | ICD-10-CM | POA: Diagnosis not present

## 2011-12-16 DIAGNOSIS — K649 Unspecified hemorrhoids: Secondary | ICD-10-CM | POA: Diagnosis not present

## 2011-12-16 DIAGNOSIS — K319 Disease of stomach and duodenum, unspecified: Secondary | ICD-10-CM | POA: Diagnosis not present

## 2011-12-16 DIAGNOSIS — K921 Melena: Secondary | ICD-10-CM | POA: Diagnosis not present

## 2011-12-16 DIAGNOSIS — D509 Iron deficiency anemia, unspecified: Secondary | ICD-10-CM | POA: Diagnosis not present

## 2011-12-16 DIAGNOSIS — K2970 Gastritis, unspecified, without bleeding: Secondary | ICD-10-CM | POA: Diagnosis not present

## 2011-12-16 DIAGNOSIS — K2980 Duodenitis without bleeding: Secondary | ICD-10-CM | POA: Diagnosis not present

## 2011-12-16 DIAGNOSIS — R195 Other fecal abnormalities: Secondary | ICD-10-CM | POA: Diagnosis not present

## 2011-12-16 DIAGNOSIS — D126 Benign neoplasm of colon, unspecified: Secondary | ICD-10-CM | POA: Diagnosis not present

## 2011-12-16 DIAGNOSIS — D500 Iron deficiency anemia secondary to blood loss (chronic): Secondary | ICD-10-CM | POA: Diagnosis not present

## 2011-12-16 HISTORY — PX: UPPER GI ENDOSCOPY: SHX6162

## 2012-03-10 ENCOUNTER — Other Ambulatory Visit: Payer: Self-pay | Admitting: Family Medicine

## 2012-03-11 ENCOUNTER — Other Ambulatory Visit: Payer: Self-pay | Admitting: Family Medicine

## 2012-03-15 ENCOUNTER — Other Ambulatory Visit: Payer: Self-pay | Admitting: Physician Assistant

## 2012-03-19 ENCOUNTER — Ambulatory Visit: Payer: Medicare Other | Admitting: Family Medicine

## 2012-04-05 ENCOUNTER — Ambulatory Visit (INDEPENDENT_AMBULATORY_CARE_PROVIDER_SITE_OTHER): Payer: Medicare Other | Admitting: Family Medicine

## 2012-04-05 ENCOUNTER — Telehealth: Payer: Self-pay | Admitting: Radiology

## 2012-04-05 ENCOUNTER — Encounter: Payer: Self-pay | Admitting: Radiology

## 2012-04-05 VITALS — BP 158/80 | HR 75 | Temp 99.0°F | Resp 16 | Ht 68.0 in | Wt 200.6 lb

## 2012-04-05 DIAGNOSIS — R0600 Dyspnea, unspecified: Secondary | ICD-10-CM

## 2012-04-05 DIAGNOSIS — E109 Type 1 diabetes mellitus without complications: Secondary | ICD-10-CM | POA: Diagnosis not present

## 2012-04-05 DIAGNOSIS — R5383 Other fatigue: Secondary | ICD-10-CM

## 2012-04-05 DIAGNOSIS — R5381 Other malaise: Secondary | ICD-10-CM | POA: Diagnosis not present

## 2012-04-05 DIAGNOSIS — R0609 Other forms of dyspnea: Secondary | ICD-10-CM

## 2012-04-05 DIAGNOSIS — E119 Type 2 diabetes mellitus without complications: Secondary | ICD-10-CM

## 2012-04-05 DIAGNOSIS — I10 Essential (primary) hypertension: Secondary | ICD-10-CM

## 2012-04-05 DIAGNOSIS — K279 Peptic ulcer, site unspecified, unspecified as acute or chronic, without hemorrhage or perforation: Secondary | ICD-10-CM

## 2012-04-05 DIAGNOSIS — D649 Anemia, unspecified: Secondary | ICD-10-CM

## 2012-04-05 DIAGNOSIS — R0989 Other specified symptoms and signs involving the circulatory and respiratory systems: Secondary | ICD-10-CM

## 2012-04-05 LAB — COMPREHENSIVE METABOLIC PANEL
ALT: 8 U/L (ref 0–53)
AST: 9 U/L (ref 0–37)
Albumin: 4.2 g/dL (ref 3.5–5.2)
Alkaline Phosphatase: 61 U/L (ref 39–117)
BUN: 13 mg/dL (ref 6–23)
CO2: 28 mEq/L (ref 19–32)
Calcium: 9.8 mg/dL (ref 8.4–10.5)
Chloride: 104 mEq/L (ref 96–112)
Creat: 1.16 mg/dL (ref 0.50–1.35)
Glucose, Bld: 160 mg/dL — ABNORMAL HIGH (ref 70–99)
Potassium: 4.9 mEq/L (ref 3.5–5.3)
Sodium: 139 mEq/L (ref 135–145)
Total Bilirubin: 0.4 mg/dL (ref 0.3–1.2)
Total Protein: 7.2 g/dL (ref 6.0–8.3)

## 2012-04-05 LAB — POCT GLYCOSYLATED HEMOGLOBIN (HGB A1C): Hemoglobin A1C: 6

## 2012-04-05 LAB — POCT CBC
Granulocyte percent: 76.7 %G (ref 37–80)
HCT, POC: 32.7 % — AB (ref 43.5–53.7)
Hemoglobin: 9.6 g/dL — AB (ref 14.1–18.1)
Lymph, poc: 1.6 (ref 0.6–3.4)
MCH, POC: 29.4 pg (ref 27–31.2)
MCHC: 29.4 g/dL — AB (ref 31.8–35.4)
MCV: 99.9 fL — AB (ref 80–97)
MID (cbc): 0.5 (ref 0–0.9)
MPV: 7.9 fL (ref 0–99.8)
POC Granulocyte: 6.7 (ref 2–6.9)
POC LYMPH PERCENT: 17.7 %L (ref 10–50)
POC MID %: 5.6 %M (ref 0–12)
Platelet Count, POC: 458 10*3/uL — AB (ref 142–424)
RBC: 3.27 M/uL — AB (ref 4.69–6.13)
RDW, POC: 16.6 %
WBC: 8.8 10*3/uL (ref 4.6–10.2)

## 2012-04-05 LAB — GLUCOSE, POCT (MANUAL RESULT ENTRY): POC Glucose: 174 mg/dl — AB (ref 70–99)

## 2012-04-05 MED ORDER — OMEPRAZOLE 40 MG PO CPDR: 40.0000 mg | DELAYED_RELEASE_CAPSULE | Freq: Two times a day (BID) | ORAL | Status: DC

## 2012-04-05 MED ORDER — METFORMIN HCL 500 MG PO TABS: 500.0000 mg | ORAL_TABLET | Freq: Every day | ORAL | Status: DC

## 2012-04-05 MED ORDER — LISINOPRIL 10 MG PO TABS: 10.0000 mg | ORAL_TABLET | Freq: Every day | ORAL | Status: DC

## 2012-04-05 NOTE — Telephone Encounter (Signed)
I have spoken to Dr Rolla Etienne office and patient is scheduled for Wed at 1015/ I have left message for his daughter [phone] patient wanted me to call her with the appointment/ I have faxed information to them at Dr Elnoria Howard office.

## 2012-04-05 NOTE — Progress Notes (Unsigned)
I have called Dr Elnoria Howard for patient and left message for his scheduler to call back for the appt, Dr Neva Seat wants this to be done today while patient is in office

## 2012-04-05 NOTE — Patient Instructions (Signed)
Increase lisinopril to 10 mg.  If any lightheadedness or dizziness - stop this medicine and return to clinic or the emergency room. Keep a record of your blood pressures outside of the office and bring them to the next office visit. Increase omeprazole to twice per day until seen by Dr. Elnoria Howard.  Avoid any NSAIDs, and minimize any alcohol intake, as this can worsen ulcers.  Recheck this Thursday morning before 12pm. Return to the clinic or go to the nearest emergency room if any of your symptoms worsen or new symptoms occur.

## 2012-04-05 NOTE — Progress Notes (Signed)
Subjective:    Patient ID: [name], male    DOB: 1945-11-07, 66 y.o.   MRN: [id number]  HPI [name] is a 66 y.o. male Last OV 11/11/11 - 1st ov here since 2009 - Reestablishing care at that time. Summary from last ov:  Hospitalized 1/8-1/11/12 for anemia (HGb 5.4), PUD and GI bleed with use of NSAIDS - see hospital d/c summary - instructed there not to use nsaids. patient aware of risks including death form bleeding, but taking NSAIDs  about twice per day for back pain at that time. Did not follow up with gastroenterologist since hospitalization at that visit - referred again to Dr. Ewing Schlein. Saw Dr. Elnoria Howard.  Hgb 11.1 at last OV.  Started omeprazole.   DM2 - off meds at last ov.  A1c 7.9 11/11/11. See last ov restarted metformin 500mg  qd,  plan to recheck in 1 month. Lisinopril 5mg  qd started at last ov  - occasional missed dose of lisinopril, last dose yesterday afternoon. No outside blood pressures.   Outside blood sugars - 198-215. No side effects of this.   Here today with multiple concerns.  Ulcer flairing up again - black stools past 2 weeks.  Did not seek care elsewhere. No syncope.  Short of breath with walking, burning in chest to breathe - improved a little since last Wednesday.  Had stopped omeprazole for 1-2 months. Last GI visit few months ago.  Restarted  Omeprazole last week after black stools noted.  Improved stools. No chest pains currently.  No abd pain today.  No current lightheadedness/dizziness.  Denies any recent NSAIDS, alcohol: 5 beers per week.    Review of Systems  Constitutional: Negative for fever and chills.  Respiratory: Positive for shortness of breath (last 2 weeks - not currently. ). Negative for chest tightness.   Cardiovascular: Negative for chest pain.  Gastrointestinal: Positive for blood in stool. Negative for abdominal pain.   As above.       Objective:   Physical Exam  Constitutional: He is oriented to person, place, and time. He appears  well-developed and well-nourished.  HENT:  Head: Normocephalic and atraumatic.  Cardiovascular: Normal rate, regular rhythm, normal heart sounds and intact distal pulses.   Pulmonary/Chest: Effort normal and breath sounds normal.  Abdominal: Soft. Bowel sounds are normal. He exhibits no distension. There is no tenderness. There is no guarding.  Neurological: He is alert and oriented to person, place, and time.  Skin: Skin is warm and dry.  Psychiatric: He has a normal mood and affect. His behavior is normal.   Results for orders placed in visit on 04/05/12  POCT CBC      Component Value Range   WBC 8.8  4.6 - 10.2 K/uL   Lymph, poc 1.6  0.6 - 3.4   POC LYMPH PERCENT 17.7  10 - 50 %L   MID (cbc) 0.5  0 - 0.9   POC MID % 5.6  0 - 12 %M   POC Granulocyte 6.7  2 - 6.9   Granulocyte percent 76.7  37 - 80 %G   RBC 3.27 (*) 4.69 - 6.13 M/uL   Hemoglobin 9.6 (*) 14.1 - 18.1 g/dL   HCT, POC 91.4 (*) 78.2 - 53.7 %   MCV 99.9 (*) 80 - 97 fL   MCH, POC 29.4  27 - 31.2 pg   MCHC 29.4 (*) 31.8 - 35.4 g/dL   RDW, POC 95.6     Platelet Count, POC 458 (*) 142 -  424 K/uL   MPV 7.9  0 - 99.8 fL  GLUCOSE, POCT (MANUAL RESULT ENTRY)      Component Value Range   POC Glucose 174 (*) 70 - 99 mg/dl  POCT GLYCOSYLATED HEMOGLOBIN (HGB A1C)      Component Value Range   Hemoglobin A1C 6.0         Assessment & Plan:  [name] is a 66 y.o. male 1. Peptic ulcer disease  POCT CBC, POCT glucose (manual entry), POCT glycosylated hemoglobin (Hb A1C), Comprehensive metabolic panel, omeprazole (PRILOSEC) 40 MG capsule, Ambulatory referral to Gastroenterology  2. Dyspnea  POCT CBC, POCT glucose (manual entry), POCT glycosylated hemoglobin (Hb A1C), Comprehensive metabolic panel  3. Fatigue  POCT CBC, POCT glucose (manual entry), POCT glycosylated hemoglobin (Hb A1C), Comprehensive metabolic panel  4. Diabetes mellitus, type II  POCT CBC, POCT glucose (manual entry), POCT glycosylated hemoglobin (Hb A1C),  Comprehensive metabolic panel, metFORMIN (GLUCOPHAGE) 500 MG tablet, lisinopril (PRINIVIL,ZESTRIL) 10 MG tablet  5. HTN (hypertension)  lisinopril (PRINIVIL,ZESTRIL) 10 MG tablet  6. Anemia  Ambulatory referral to Gastroenterology    Dm2 - controlled based on A1c.  Continue metformin 500mg  qd.   HTN - uncontrolled.  Increase lisinopril to 10mg  QD.  Orthostatic precautions.   PUD with anemia - reported dyspnea and black tarry stools 2 weeks ago through last week.  Discussed risks of PUD and bleeding and need for emergency room visit in future. Understanding expressed. Increase omeprazole to BID for now, and recheck with Dr. Elnoria Howard in next 2 weeks to discuss if repeat imaging needed.  Recheck CBC with me in next 3 days to make sure continuing to improve HGB.  Continue iron supplement. Rtc/er if any return of dark tarry stools or dyspnea returns.

## 2012-04-06 NOTE — Telephone Encounter (Signed)
I have advised patients daughter of this appointment and she is now asking about the medication the Prilosec is over $100 please advise if there any any alternative that may me more cost effective.

## 2012-04-08 ENCOUNTER — Ambulatory Visit (INDEPENDENT_AMBULATORY_CARE_PROVIDER_SITE_OTHER): Payer: Medicare Other | Admitting: Family Medicine

## 2012-04-08 VITALS — BP 140/78 | HR 71 | Temp 99.0°F | Resp 16 | Ht 66.5 in | Wt 198.0 lb

## 2012-04-08 DIAGNOSIS — K279 Peptic ulcer, site unspecified, unspecified as acute or chronic, without hemorrhage or perforation: Secondary | ICD-10-CM

## 2012-04-08 DIAGNOSIS — E119 Type 2 diabetes mellitus without complications: Secondary | ICD-10-CM | POA: Diagnosis not present

## 2012-04-08 DIAGNOSIS — D649 Anemia, unspecified: Secondary | ICD-10-CM | POA: Diagnosis not present

## 2012-04-08 LAB — POCT CBC
Granulocyte percent: 73.6 %G (ref 37–80)
HCT, POC: 35.5 % — AB (ref 43.5–53.7)
Hemoglobin: 10.2 g/dL — AB (ref 14.1–18.1)
Lymph, poc: 1.5 (ref 0.6–3.4)
MCH, POC: 28.7 pg (ref 27–31.2)
MCHC: 28.7 g/dL — AB (ref 31.8–35.4)
MCV: 99.9 fL — AB (ref 80–97)
MID (cbc): 0.5 (ref 0–0.9)
MPV: 8.5 fL (ref 0–99.8)
POC Granulocyte: 5.5 (ref 2–6.9)
POC LYMPH PERCENT: 19.6 %L (ref 10–50)
POC MID %: 6.8 %M (ref 0–12)
Platelet Count, POC: 420 10*3/uL (ref 142–424)
RBC: 3.55 M/uL — AB (ref 4.69–6.13)
RDW, POC: 16.4 %
WBC: 7.5 10*3/uL (ref 4.6–10.2)

## 2012-04-08 MED ORDER — OMEPRAZOLE 20 MG PO CPDR: 40.0000 mg | DELAYED_RELEASE_CAPSULE | Freq: Every day | ORAL | Status: DC

## 2012-04-08 NOTE — Patient Instructions (Signed)
Keep follow up with Dr. Elnoria Howard. Take 2 of prilosec each day. Recheck in 6 weeks for kidney function test, and recheck blood pressure and diabetes.  Return to the clinic or go to the nearest emergency room if any of your symptoms worsen or new symptoms occur.

## 2012-04-08 NOTE — Telephone Encounter (Signed)
See office visit

## 2012-04-08 NOTE — Progress Notes (Signed)
Subjective:    Patient ID: [name], male    DOB: Aug 05, 1946, 66 y.o.   MRN: [id number]  HPI [name] is a 66 y.o. male See ov 3 days ago - hx PUD, with hospitalization in January - recent black tarry stools and dyspnea that resolved on own prior to last ov off PPI.   Results for orders placed in visit on 04/05/12  POCT CBC      Component Value Range   WBC 8.8  4.6 - 10.2 K/uL   Lymph, poc 1.6  0.6 - 3.4   POC LYMPH PERCENT 17.7  10 - 50 %L   MID (cbc) 0.5  0 - 0.9   POC MID % 5.6  0 - 12 %M   POC Granulocyte 6.7  2 - 6.9   Granulocyte percent 76.7  37 - 80 %G   RBC 3.27 (*) 4.69 - 6.13 M/uL   Hemoglobin 9.6 (*) 14.1 - 18.1 g/dL   HCT, POC 40.9 (*) 81.1 - 53.7 %   MCV 99.9 (*) 80 - 97 fL   MCH, POC 29.4  27 - 31.2 pg   MCHC 29.4 (*) 31.8 - 35.4 g/dL   RDW, POC 91.4     Platelet Count, POC 458 (*) 142 - 424 K/uL   MPV 7.9  0 - 99.8 fL  GLUCOSE, POCT (MANUAL RESULT ENTRY)      Component Value Range   POC Glucose 174 (*) 70 - 99 mg/dl  POCT GLYCOSYLATED HEMOGLOBIN (HGB A1C)      Component Value Range   Hemoglobin A1C 6.0    COMPREHENSIVE METABOLIC PANEL      Component Value Range   Sodium 139  135 - 145 mEq/L   Potassium 4.9  3.5 - 5.3 mEq/L   Chloride 104  96 - 112 mEq/L   CO2 28  19 - 32 mEq/L   Glucose, Bld 160 (*) 70 - 99 mg/dL   BUN 13  6 - 23 mg/dL   Creat 7.82  9.56 - 2.13 mg/dL   Total Bilirubin 0.4  0.3 - 1.2 mg/dL   Alkaline Phosphatase 61  39 - 117 U/L   AST 9  0 - 37 U/L   ALT 8  0 - 53 U/L   Total Protein 7.2  6.0 - 8.3 g/dL   Albumin 4.2  3.5 - 5.2 g/dL   Calcium 9.8  8.4 - 08.6 mg/dL   Planning on GI follow up in next 2 weeks. Here for recheck CBC. Increased lisinopril to 10mg  QD last ov.  No further blood in stools, no dyspnea. Taking iron.  Was not able to afford acid blocker, so has not taken in past 3 days.    Review of Systems  Constitutional: Negative for fever and chills.  Respiratory: Negative for chest tightness and shortness of  breath.   Gastrointestinal: Negative for abdominal pain, blood in stool and anal bleeding.  Genitourinary: Negative for hematuria.  Neurological: Negative for dizziness and light-headedness.       Objective:   Physical Exam  Constitutional: He is oriented to person, place, and time. He appears well-developed and well-nourished.  Cardiovascular: Normal rate, regular rhythm, normal heart sounds and intact distal pulses.   Pulmonary/Chest: Effort normal.  Abdominal: Soft. Bowel sounds are normal. There is no tenderness.  Neurological: He is alert and oriented to person, place, and time.  Skin: Skin is warm and dry.  Psychiatric: He has a normal mood and affect. His behavior is  normal.    Results for orders placed in visit on 04/08/12  POCT CBC      Component Value Range   WBC 7.5  4.6 - 10.2 K/uL   Lymph, poc 1.5  0.6 - 3.4   POC LYMPH PERCENT 19.6  10 - 50 %L   MID (cbc) 0.5  0 - 0.9   POC MID % 6.8  0 - 12 %M   POC Granulocyte 5.5  2 - 6.9   Granulocyte percent 73.6  37 - 80 %G   RBC 3.55 (*) 4.69 - 6.13 M/uL   Hemoglobin 10.2 (*) 14.1 - 18.1 g/dL   HCT, POC 16.1 (*) 09.6 - 53.7 %   MCV 99.9 (*) 80 - 97 fL   MCH, POC 28.7  27 - 31.2 pg   MCHC 28.7 (*) 31.8 - 35.4 g/dL   RDW, POC 04.5     Platelet Count, POC 420  142 - 424 K/uL   MPV 8.5  0 - 99.8 fL      Assessment & Plan:  [name] is a 66 y.o. male  1. DM2 (diabetes mellitus, type 2)    2. Anemia  POCT CBC  3. PUD (peptic ulcer disease)  POCT CBC  4. Peptic ulcer disease  omeprazole (PRILOSEC) 20 MG capsule   Stable/improved CBC.  New rx for omeprazole 20mg  - 2 qd as hx PUD.  Follow up with GI and recheck DM and HTN in next 6 weeks as recent increase in lisinopril.  RTC precautions.

## 2012-04-14 DIAGNOSIS — R079 Chest pain, unspecified: Secondary | ICD-10-CM | POA: Diagnosis not present

## 2012-04-14 DIAGNOSIS — D500 Iron deficiency anemia secondary to blood loss (chronic): Secondary | ICD-10-CM | POA: Diagnosis not present

## 2012-04-14 DIAGNOSIS — R0602 Shortness of breath: Secondary | ICD-10-CM | POA: Diagnosis not present

## 2012-05-12 DIAGNOSIS — D500 Iron deficiency anemia secondary to blood loss (chronic): Secondary | ICD-10-CM | POA: Diagnosis not present

## 2012-09-14 ENCOUNTER — Other Ambulatory Visit: Payer: Self-pay | Admitting: Family Medicine

## 2012-11-01 ENCOUNTER — Other Ambulatory Visit: Payer: Self-pay | Admitting: Physician Assistant

## 2012-11-01 ENCOUNTER — Other Ambulatory Visit: Payer: Self-pay | Admitting: Family Medicine

## 2012-11-01 NOTE — Telephone Encounter (Signed)
RF of Lisinopril requested from pharmacy. Pt was due for f/up 6 wks after last OV in Aug. Ask when pt can RTC. LM w/male answering phone to have pt CB.

## 2013-11-03 ENCOUNTER — Encounter: Payer: Self-pay | Admitting: Family Medicine

## 2014-10-17 ENCOUNTER — Ambulatory Visit (INDEPENDENT_AMBULATORY_CARE_PROVIDER_SITE_OTHER): Payer: Medicare Other | Admitting: Sports Medicine

## 2014-10-17 VITALS — BP 168/76 | HR 74 | Temp 98.0°F | Resp 18 | Ht 67.5 in | Wt 207.0 lb

## 2014-10-17 DIAGNOSIS — I10 Essential (primary) hypertension: Secondary | ICD-10-CM

## 2014-10-17 DIAGNOSIS — Z125 Encounter for screening for malignant neoplasm of prostate: Secondary | ICD-10-CM

## 2014-10-17 DIAGNOSIS — E119 Type 2 diabetes mellitus without complications: Secondary | ICD-10-CM | POA: Diagnosis not present

## 2014-10-17 DIAGNOSIS — D509 Iron deficiency anemia, unspecified: Secondary | ICD-10-CM

## 2014-10-17 DIAGNOSIS — K279 Peptic ulcer, site unspecified, unspecified as acute or chronic, without hemorrhage or perforation: Secondary | ICD-10-CM

## 2014-10-17 DIAGNOSIS — Z23 Encounter for immunization: Secondary | ICD-10-CM

## 2014-10-17 LAB — POCT URINALYSIS DIPSTICK
Bilirubin, UA: NEGATIVE
Glucose, UA: 500
Leukocytes, UA: NEGATIVE
Nitrite, UA: NEGATIVE
Protein, UA: NEGATIVE
Spec Grav, UA: 1.025
Urobilinogen, UA: 0.2
pH, UA: 5

## 2014-10-17 LAB — POCT GLYCOSYLATED HEMOGLOBIN (HGB A1C): Hemoglobin A1C: 10.1

## 2014-10-17 MED ORDER — FERROUS SULFATE 325 (65 FE) MG PO TABS: 325.0000 mg | ORAL_TABLET | Freq: Every day | ORAL | Status: DC

## 2014-10-17 MED ORDER — LISINOPRIL 10 MG PO TABS: 10.0000 mg | ORAL_TABLET | Freq: Every day | ORAL | Status: DC

## 2014-10-17 MED ORDER — METFORMIN HCL 500 MG PO TABS: 500.0000 mg | ORAL_TABLET | Freq: Two times a day (BID) | ORAL | Status: DC

## 2014-10-17 MED ORDER — OMEPRAZOLE 20 MG PO CPDR: 40.0000 mg | DELAYED_RELEASE_CAPSULE | Freq: Every day | ORAL | Status: DC

## 2014-10-17 MED ORDER — ASPIRIN 81 MG PO TABS: 81.0000 mg | ORAL_TABLET | Freq: Every day | ORAL | Status: DC

## 2014-10-17 NOTE — Progress Notes (Addendum)
Subjective:    Patient ID: [name], male    DOB: [birth year], 69 y.o.   MRN: [id number]  HPI [name] is a 69 year-old male who presents for follow-up of DM. He also has a hx of uncontrolled HTN and PUD.  DM: He was previously on metformin 500mg  daily, but ran out of this medication. He is due for labs. He ran out of his ACE inhibitor. He is not currently on a statin. Her last diabetic eye exam was several years ago.  Floaters: Occasionally Maintains current weight. Tries to eat well. Denies any fatigue, polydipsia, polyuria, numbness, or tingling.  Pneumovax: Due Zostavax: Due  HTN: uncontrolled Ran out of lisinopril. Denies any chest pain, SOB, fatigue, swelling, nausea, palpitations, or diaphoresis. Activity level is fairly sedentary.  Obesity: -Activity level is fairly sedentary -Dietary habits, tries to eat healthy  Hx PUD, iron deficiency anemia. -Last colonoscopy less than 5 years ago -Denies melena or hematochezia. -No weight loss, fevers, chills, or night sweats.  Tobacco Use: Until recently, was 1ppd x 35 years cigarettes. Currently cut back to 1-2 cigarettes per day. No medications. Motivated to quit. Not interested in pharmacotherapy.  History   Social History  . Marital Status: Single    Spouse Name: N/A  . Number of Children: N/A  . Years of Education: N/A   Social History Main Topics  . Smoking status: Current Every Day Smoker -- 1.00 packs/day for 35 years    Types: Cigars, Cigarettes  . Smokeless tobacco: Not on file  . Alcohol Use: Not on file  . Drug Use: Not on file  . Sexual Activity: Not on file   Other Topics Concern  . None   Social History Narrative    Review of Systems 11 point review of systems was performed and was otherwise negative unless noted in the history of present illness.     Objective:   Physical Exam BP 168/76 mmHg  Pulse 74  Temp(Src) 98 F (36.7 C) (Oral)  Resp 18  Ht 5' 7.5" (1.715 m)  Wt 207 lb  (93.895 kg)  BMI 31.92 kg/m2  SpO2 98% General appearance: alert, cooperative and appears stated age Head: Normocephalic, without obvious abnormality, atraumatic Eyes: conjunctivae/corneas clear. PERRL, EOM's intact. Fundi benign. Nose: Nares normal. Septum midline. Mucosa normal. No drainage or sinus tenderness. Throat: lips, mucosa, and tongue normal; teeth and gums normal Neck: no adenopathy, no carotid bruit, no JVD, supple, symmetrical, trachea midline and thyroid not enlarged, symmetric, no tenderness/mass/nodules Back: symmetric, no curvature. ROM normal. No CVA tenderness. Lungs: clear to auscultation bilaterally Heart: regular rate and rhythm, S1, S2 normal, no murmur, click, rub or gallop Abdomen: soft, non-tender; bowel sounds normal; no masses,  no organomegaly Extremities: extremities normal, atraumatic, no cyanosis or edema Pulses: 2+ and symmetric Skin: Right middle finger nail with 72mm linear darker streak with regular borders. No tenderness or swelling. Lymph nodes: Cervical, supraclavicular, and axillary nodes normal.  Feet: No ulceration. Sensation intact.  POC HgbA1c 10.1.    Assessment & Plan:  1. Diabetes, Type 2, uncontrolled, HgbA1c 10.1. -Re-start metformin at increased dose of metformin 500mg  twice daily, will likely require additional medication titration and second med. -Check labs (POC HgbA1c, lipid panel, urine microalbumin) -Diabetic foot exam completed today -May need to discuss starting a statin -Referral for diabetic eye exam -Dietary counseling performed today -follow-up in 3 months or sooner if needed.  2. HTN, uncontrolled -Re-start lisinopril 10mg  po qd -Check labs (BMP) -If any  sx of chest pain, SOB, palpitations, vision changes, dizziness, then go immediately to ED. Patient verbalized understanding and agreement. -Plan for f/u BP check in 2-4 weeks  3. Iron deficiency anemia 2/2 PUD, asymptomatic -Check CBC today -Refilled ferrous  sulfate -Colonoscopy UTD  4. Tobacco Use -Counseled on cessation -Patient declined pharmacotherapy  5. Obesity -Dietary and exercise counseling today  6. Vaccinations -Due for Pneumovax and Shingles vaccines -Pneumovax given today -Rx for Shingles vaccine given  7. Ethnic Menanosis of right finger nail. -Benign in appearance -Counseled on looking for any nail changes -Follow-up in 3 months for re-check or sooner if needed  Dr. Linnell Fulling, DO Sports Medicine Fellow  Discussed with Dr. Laney Pastor. I have participated in the care of this patient with the [facility] Fellow and agree with Diagnosis and Plan as documented. Robert P. Laney Pastor, M.D.

## 2014-10-17 NOTE — Patient Instructions (Addendum)
1. Diabetes, Type 2. Your Hemoglobin A1c was 10.1 today. Our goal is <7. -Re-start metformin at 500mg  twice daily -Check labs (POC HgbA1c, lipid panel, urine microalbumin) -Would recommend considering starting a statin- will check cholesterol -Referral for diabetic eye exam, as should have an annual eye exam -follow-up in 3 months or sooner if needed.  2. High Blood Pressure, not at goal today. Goal <140/90, consider <130/80. -Re-start lisinopril 10mg  po qd -Check labs (BMP) -If any sx of chest pain, SOB, palpitations, vision changes, dizziness, then go immediately to ED. -Plan for f/u BP check in 2-4 weeks for blood pressure re-check  3. Iron deficiency anemia due to peptic ulcer disease -Check CBC today -Refilled ferrous sulfate -Colonoscopy up-to-date, next due in 5-7 years  4. Tobacco Use -Encourage quitting completely  5. Vaccinations -Due for Pneumovax and Shingles vaccines -Pneumovax given today -Prescription for Shingles vaccine given  6. Left armpit pain -Should resolve within 2 weeks -If symptoms persist, then follow-up -If suddenly worsening, then follow-up sooner  It was good to see you!  We will be in touch regarding your lab-work.

## 2014-10-18 LAB — CBC
HCT: 43.9 % (ref 39.0–52.0)
Hemoglobin: 14.7 g/dL (ref 13.0–17.0)
MCH: 31.4 pg (ref 26.0–34.0)
MCHC: 33.5 g/dL (ref 30.0–36.0)
MCV: 93.8 fL (ref 78.0–100.0)
MPV: 11.2 fL (ref 8.6–12.4)
Platelets: 254 10*3/uL (ref 150–400)
RBC: 4.68 MIL/uL (ref 4.22–5.81)
RDW: 13.3 % (ref 11.5–15.5)
WBC: 7 10*3/uL (ref 4.0–10.5)

## 2014-10-19 LAB — LIPID PANEL
Cholesterol: 183 mg/dL (ref 0–200)
HDL: 42 mg/dL (ref >=40)
LDL Cholesterol: 116 mg/dL — ABNORMAL HIGH (ref 0–99)
Total CHOL/HDL Ratio: 4.4 Ratio
Triglycerides: 123 mg/dL (ref <150)
VLDL: 25 mg/dL (ref 0–40)

## 2014-10-19 LAB — IBC PANEL
%SAT: 14 % — ABNORMAL LOW (ref 20–55)
TIBC: 304 ug/dL (ref 215–435)
UIBC: 260 ug/dL (ref 125–400)

## 2014-10-19 LAB — MICROALBUMIN, URINE: Microalb, Ur: 1.6 mg/dL (ref <2.0)

## 2014-10-19 LAB — BASIC METABOLIC PANEL
BUN: 15 mg/dL (ref 6–23)
CO2: 27 mEq/L (ref 19–32)
Calcium: 9.6 mg/dL (ref 8.4–10.5)
Chloride: 101 mEq/L (ref 96–112)
Creat: 1.21 mg/dL (ref 0.50–1.35)
Glucose, Bld: 228 mg/dL — ABNORMAL HIGH (ref 70–99)
Potassium: 4.7 mEq/L (ref 3.5–5.3)
Sodium: 136 mEq/L (ref 135–145)

## 2014-10-19 LAB — IRON: Iron: 44 ug/dL (ref 42–165)

## 2014-10-26 ENCOUNTER — Telehealth: Payer: Self-pay | Admitting: Radiology

## 2014-10-26 NOTE — Telephone Encounter (Signed)
Pt called about labs. Noticed that some were still pending, so called [facility] and they said they would release to [facility]. Please review. Thanks.

## 2014-10-27 NOTE — Telephone Encounter (Signed)
Erin called and spoke with [facility]. Pts name was incorrect. They will be releasing labs today. Dr Nolene Ebbs to follow.

## 2014-11-01 ENCOUNTER — Telehealth: Payer: Self-pay | Admitting: *Deleted

## 2014-11-01 NOTE — Telephone Encounter (Signed)
Pt daughter called and stated the metformin the pt is taking is causing him to be dizzy and have tumbling issues.  Pt states he is suppose to take 2 tabs a day but he is only taking an half and he still is having issues. I advised her to bring pt in for further evaluation regarding is medication.  Pt was also concerned about their labs from 10/17/14.

## 2014-11-03 DIAGNOSIS — E11329 Type 2 diabetes mellitus with mild nonproliferative diabetic retinopathy without macular edema: Secondary | ICD-10-CM | POA: Diagnosis not present

## 2014-11-03 DIAGNOSIS — H2513 Age-related nuclear cataract, bilateral: Secondary | ICD-10-CM | POA: Diagnosis not present

## 2014-11-07 NOTE — Telephone Encounter (Signed)
Called patient. Discussed results. Patient says he has upcoming appointment to discuss meds. Likely needs long acting insulin.

## 2014-11-10 ENCOUNTER — Ambulatory Visit (INDEPENDENT_AMBULATORY_CARE_PROVIDER_SITE_OTHER): Payer: Medicare Other | Admitting: Family Medicine

## 2014-11-10 VITALS — BP 158/92 | HR 83 | Temp 98.7°F | Resp 18 | Ht 67.5 in | Wt 203.0 lb

## 2014-11-10 DIAGNOSIS — I499 Cardiac arrhythmia, unspecified: Secondary | ICD-10-CM

## 2014-11-10 DIAGNOSIS — E119 Type 2 diabetes mellitus without complications: Secondary | ICD-10-CM | POA: Diagnosis not present

## 2014-11-10 DIAGNOSIS — I493 Ventricular premature depolarization: Secondary | ICD-10-CM

## 2014-11-10 DIAGNOSIS — R42 Dizziness and giddiness: Secondary | ICD-10-CM

## 2014-11-10 LAB — GLUCOSE, POCT (MANUAL RESULT ENTRY): POC Glucose: 185 mg/dl — AB (ref 70–99)

## 2014-11-10 MED ORDER — GLIMEPIRIDE 2 MG PO TABS: 2.0000 mg | ORAL_TABLET | Freq: Every day | ORAL | Status: DC

## 2014-11-10 NOTE — Patient Instructions (Signed)
Begin taking Amaryl (glimepiride) 2 mg each morning at breakfast. If tolerating this for about 5 days then increase to 2 mg twice daily at breakfast and supper  Return in 3-4 weeks for a recheck of your diabetes  Referral is being made to the cardiologist for evaluation of the irregular heartbeats. Our referrals office should call you sometime early in the week.  If worse dizziness at anytime or if any chest pains associated with it go to the emergency room

## 2014-11-10 NOTE — Progress Notes (Signed)
Subjective: 69 year old man who has a history of diabetes for a number of years. He been off medicine and doing fairly well seemingly for the last couple of years. When he came in here a few weeks ago he was started back on metformin which he had been on in the past. He tried taking one at a time, and had dizziness. He tried a half pill and had dizziness. He has done okay other times. He does not describe having a lot of headaches or any chest pains. He is retired, but does some yard work which gets pretty active but he does not get daily exercise. He does smoke. He has quit those. He says he did have a cath several years ago and was okay. I cannot find the reports of that.  Objective: Alert and oriented. Throat clear. Neck supple. Chest clear. Heart has frequent ectopy.  Assessment: Diabetes unsatisfactory control Dizziness, which patient relates to taking the metformin PVCs and arrhythmia Hypertension  Plan: Amaryl 2 mg daily, increase of possible Recheck diabetes in a few weeks Referral to cardiology  Results for orders placed or performed in visit on 11/10/14  POCT glucose (manual entry)  Result Value Ref Range   POC Glucose 185 (A) 70 - 99 mg/dl

## 2014-11-16 ENCOUNTER — Telehealth: Payer: Self-pay

## 2014-11-16 NOTE — Telephone Encounter (Signed)
Do you think pt needs to be seen sooner for cardiology?

## 2014-11-16 NOTE — Telephone Encounter (Signed)
Pt's duaghter is needing to talk with someone regarding fathers referral it is schedued in may and she would like to know if this needs to be addressed sooner    Best number [phone]

## 2014-11-18 NOTE — Telephone Encounter (Signed)
Call patient: If still having symptoms we can call and try to have it moved up.

## 2014-11-20 NOTE — Telephone Encounter (Signed)
Referrals, can you help with this?

## 2015-02-07 DIAGNOSIS — I739 Peripheral vascular disease, unspecified: Secondary | ICD-10-CM | POA: Diagnosis not present

## 2015-02-07 DIAGNOSIS — Z87898 Personal history of other specified conditions: Secondary | ICD-10-CM | POA: Diagnosis not present

## 2015-02-07 DIAGNOSIS — I10 Essential (primary) hypertension: Secondary | ICD-10-CM | POA: Diagnosis not present

## 2015-02-07 DIAGNOSIS — R9431 Abnormal electrocardiogram [ECG] [EKG]: Secondary | ICD-10-CM | POA: Diagnosis not present

## 2015-02-23 DIAGNOSIS — R0789 Other chest pain: Secondary | ICD-10-CM | POA: Diagnosis not present

## 2015-02-23 DIAGNOSIS — R9431 Abnormal electrocardiogram [ECG] [EKG]: Secondary | ICD-10-CM | POA: Diagnosis not present

## 2015-03-21 DIAGNOSIS — I10 Essential (primary) hypertension: Secondary | ICD-10-CM | POA: Diagnosis not present

## 2015-03-22 DIAGNOSIS — I739 Peripheral vascular disease, unspecified: Secondary | ICD-10-CM | POA: Diagnosis not present

## 2015-03-26 DIAGNOSIS — I10 Essential (primary) hypertension: Secondary | ICD-10-CM | POA: Diagnosis not present

## 2015-03-26 DIAGNOSIS — Z87898 Personal history of other specified conditions: Secondary | ICD-10-CM | POA: Diagnosis not present

## 2015-03-26 DIAGNOSIS — E780 Pure hypercholesterolemia: Secondary | ICD-10-CM | POA: Diagnosis not present

## 2015-03-26 DIAGNOSIS — I739 Peripheral vascular disease, unspecified: Secondary | ICD-10-CM | POA: Diagnosis not present

## 2015-04-16 DIAGNOSIS — I739 Peripheral vascular disease, unspecified: Secondary | ICD-10-CM | POA: Diagnosis not present

## 2015-04-16 DIAGNOSIS — E1165 Type 2 diabetes mellitus with hyperglycemia: Secondary | ICD-10-CM | POA: Diagnosis not present

## 2015-04-16 DIAGNOSIS — R9431 Abnormal electrocardiogram [ECG] [EKG]: Secondary | ICD-10-CM | POA: Diagnosis not present

## 2015-04-16 DIAGNOSIS — R9430 Abnormal result of cardiovascular function study, unspecified: Secondary | ICD-10-CM | POA: Diagnosis not present

## 2015-04-22 DIAGNOSIS — R079 Chest pain, unspecified: Secondary | ICD-10-CM

## 2015-04-22 DIAGNOSIS — I739 Peripheral vascular disease, unspecified: Secondary | ICD-10-CM

## 2015-04-22 NOTE — H&P (Signed)
OFFICE VISIT NOTES COPIED TO EPIC FOR DOCUMENTATION  [name] 04-19-2015 2:30 PM Location: [facility] Patient #: [phone] DOB: 04-12-1946 Married / Language: English / Race: Black or African American Male  History of Present Illness [name] K. Vyas MD; 03/27/2015 9:45 AM) Patient words: F/U for test results.  The patient is a 69 year old male who presents for a Follow-up for Dizziness. and abnormal stress nuclear scans, PAD.  [name] is 69 years old African-American male.. Patient had a spell of substernal chest pain at night. He woke up from sleep. There was no radiation to the arms, neck or back and no associated shortness of breath or diaphoresis. No history of exertional chest pain. No history of shortness of breath, orthopnea or PND. He complains of feeling tired after doing yard work. No complaints of swelling on the legs. Patient has complaints of "tired feeling and tightness in the right leg on walking" or doing yard work etc.  Patient started feeling dizziness in June this year. Patient says that it happened after he was started on metformin for diabetes. After 30-45 minutes of taking metformin, he became very dizzy and had to lie down. After another hour he felt well and there were no problems later on. It occurred for several days and he cut down the dose to half tablet twice a day. Since then, he has felt much better although he still has occasional mild dizziness after taking the morning dose of metformin. He denies feeling any palpitations, sudden heart racing or irregular heart beat. Patient was found to have premature beats on examination at his PCPs office. There is no history of near-syncope or syncope at any time.  Patient has hypertension, diabetes mellitus type 2 (uncontrolled). He also has hypercholesterolemia. He had smoked half pack of cigars daily for almost 50 years, he has quit smoking in June, 2016. He walks for about 20 minutes twice a  week and does yard and garden work [employer] research [employer], [employer].  No history of thyroid problems. No history of TIA or CVA.  Problem List/Past Medical [name]; April 19, 2015 3:16 PM) History of chest pain (Z87.898) Abnormal EKG (R94.31) Benign essential hypertension (I10) PAD (peripheral artery disease) (I73.9) Hypercholesterolemia (E78.0) BMI 29.0-29.9,adult (Z68.29) Dizziness (R42) GERD (gastroesophageal reflux disease) (K21.9) Diabetes type 2, uncontrolled (E11.65) Peptic ulcer (K27.9)  Allergies ([name]; 04/19/15 3:16 PM) No Known Drug Allergies06/22/2016  Family History [name]; 2015/04/19 3:16 PM) Mother Deceased. at age 47 from Old Age; no known heart conditions Father Deceased. at age 47, unknown reason Sister 4 2-Older; 1-Younger; no known heart conditons Brother 2 Younger; no known heart conditions  Social History ([name]; 2015/04/19 3:16 PM) Current tobacco use Former smoker. Quit January 17, 2015 Alcohol Use Occasional alcohol use. Marital status Married. Number of Children 2. Living Situation Lives with spouse.  Past Surgical History [name]; 04-19-2015 3:16 PM) Hemorrhoidectomy1987  Medication History ([name]; 2015-04-19 3:19 PM) Atorvastatin Calcium (40MG  Tablet, 1 (one) Tablet Oral daily, Taken starting 02/07/2015) Active. Lisinopril (20MG  Tablet, 1 Tablet Oral daily, Taken starting 02/07/2015) Active. Amaryl (2MG  Tablet, Oral) Active. MetFORMIN HCl (500MG  Tablet, 1/2 Oral two times daily) Active. Omeprazole (20MG  Tablet DR, 1 Oral daily) Active. Iron (18MG  Tablet ER, 1 Oral daily) Active. Aspirin EC (81MG  Tablet DR, 1 Oral daily) Active. Medications Reconciled  Diagnostic Studies History [name]; April 19, 2015 8:35 AM) Echocardiogram08/10/2014 1. Left ventricle cavity is normal in size. Moderate concentric hypertrophy of the left ventricle. Normal global wall motion. Doppler evidence of  grade I  (impaired) diastolic dysfunction. Calculated EF 57%. 2. Left atrial cavity is normal in size. An aneurysm with a possible patent foramen ovale is present. 3. Trace aortic regurgitation. 4. Mild mitral regurgitation. Mild calcification of the mitral valve annulus. 5. Mild tricuspid regurgitation. No evidence of pulmonary hypertension. Nuclear stress test07/03/2015 1. The resting electrocardiogram demonstrated normal sinus rhythm and normal resting conduction. Poor R wave progression. Stress EKG is non diagnostic for ischemia as it is a pharmacologic stress using Lexiscan. Stress symptoms included dyspnea. 2. Left ventricular cavity is noted to be enlarged on the rest and stress studies. The LV end-diastolic volume was 850YD. The TID was 1.01 (normal). SPECT images demonstrate homogeneous tracer distribution throughout the myocardium. The left ventricular ejection fraction was calculated or visually estimated to be 39%. This represents an intermediate risk scan, in a patient with DM and PAD, Multivessel CAD cannot be excluded. Clinical correlation recommended. Lower Extremity Dopplers08/11/2014 1. Significant velocity increase at the right distal superficial femoral artery suggests >50% stenosis. No hemodynamically significant stenoses are identified in the left lower extremity arterial system.Diffuse soft plaque noted bilaterally. 2. This exam reveals moderately decreased perfusion of both the lower extremities with RABI 0.68 and LABI 0.79, noted at the post tibial artery level.   Review of Systems [name] K. Vyas MD; 03/27/2015 10:00 AM)  Note: GENERAL- Feels tired on exertion, No fever, chills. No recent weight change. CARDIO VASCULAR- Had one spell of chest pain, No shortness of breath, orthopnea or PND. No palpitation, Has dizziness, No fainting. Has hypertension and high cholesterol. No swelling on legs. Has claudication in leg, No cramps. No h/o DVT PULMONARY- No cough, phlegm, wheezing, not  feeling congested in chest. GASTROINTESTINAL- No abdominal pain, nausea, vomiting or diarrhea. No dark tarry stools.Normal appetite. Has heartburn. No jaundice. ENDOCRINE- No Thyroid problem, No feeling of excessive heat or cold, No polydipsia or polyuria. Has Diabetes. NEUROLOGICAL- No focal motor or sensory symptoms, Good coordination. No seizures. MUSCULOSKELETAL- No generalized myalgias or muscle weakness. No joint swelling SKIN- No skin rash, No pruritus HEMATOLOGY- No anemia, petechiae, excessive bruising, epistaxis, GI bleed or any abnormal bleeding.  Vitals [name]; 03/26/2015 3:27 PM) 03/26/2015 3:20 PM Weight: 194.56 lb Height: 68in Body Surface Area: 2.02 m Body Mass Index: 29.58 kg/m  Pulse: 76 (Regular)  P.OX: 96% (Room air) BP: 162/92 (Sitting, Left Arm, Standard)     Physical Exam [name] K. Vyas MD; 03/27/2015 10:01 AM) The physical exam findings are as follows: Note:GENERAL APPEARANCE- Alert, Oriented. Well built, overweight. HEENT- Unremarkable, fundi were not examined. NECK- No JVD. Carotid pulses are 2+, No bruits audible. No thyromegaly. No lymphadenopathy. HEART- Palpation- Apex beat is palpable in left 5th intercostal space, inside midclavicular line. No heave or thrills palpable. Auscultation- Normal S1, S2. No gallops. Grade 2/6 soft mid systolic murmur is audible at the apex. CHEST- Normal shape. Normal percussion. Auscultation- Normal breath sounds, No crepitations. No wheezing. ABDOMEN- Palpation- Soft, Nontender. No hepatosplenomegaly. No masses felt. Auscultation- Normal bowel sounds. No bruits audible. EXTREMITIES- No Clubbing or Cyanosis. No edema on legs or feet. PERIPHERAL PULSES- Both femoral pulses- 2+. Bruits are audible over both femoral arteries, louder on the left side. Right dorsalis pedis is not palpable, left dorsalis pedis is faint. Both posterior tibial arteries are also faint.  Assessment & Plan [name] K. Vyas MD;  03/27/2015 9:58 AM) History of chest pain (X41.287) Story: Nuclear stress test 02/23/2015 1. The resting electrocardiogram demonstrated normal sinus rhythm and normal resting conduction.  Poor R wave progression. Stress EKG is non diagnostic for ischemia as it is a pharmacologic stress using Lexiscan. Stress symptoms included dyspnea. 2. Left ventricular cavity is noted to be enlarged on the rest and stress studies. The LV end-diastolic volume was 696VE. The TID was 1.01 (normal). SPECT images demonstrate homogeneous tracer distribution throughout the myocardium. The left ventricular ejection fraction was calculated or visually estimated to be 39%. This represents an intermediate risk scan, in a patient with DM and PAD, Multivessel CAD cannot be excluded. Clinical correlation recommended. PAD (peripheral artery disease) (I73.9) Story: Lower Extremity Dopplers 03/22/2015 1. Significant velocity increase at the right distal superficial femoral artery suggests >50% stenosis. No hemodynamically significant stenoses are identified in the left lower extremity arterial system.Diffuse soft plaque noted bilaterally. 2. This exam reveals moderately decreased perfusion of both the lower extremities with RABI 0.68 and LABI 0.79, noted at the post tibial artery level. Benign essential hypertension (I10) Story: Echocardiogram 03/21/2015 1. Left ventricle cavity is normal in size. Moderate concentric hypertrophy of the left ventricle. Normal global wall motion. Doppler evidence of grade I (impaired) diastolic dysfunction. Calculated EF 57%. 2. Left atrial cavity is normal in size. An aneurysm with a possible patent foramen ovale is present. 3. Trace aortic regurgitation. 4. Mild mitral regurgitation. Mild calcification of the mitral valve annulus. 5. Mild tricuspid regurgitation. No evidence of pulmonary hypertension. Current Plans Started Metoprolol Succinate ER 50MG , 1 (one) Tablet ER 24HR daily, #30, 30 days starting  03/26/2015, Ref. x3. Hypercholesterolemia (E78.0) Note:Results of echocardiogram, stress nuclear scans and lower arterial duplex study were explained to the patient (please see the results above). In view of abnormal stress nuclear scans and patient's history, diabetes, multiple other coronary risk factors and PAD, there is concern for possible significant multi-vessel coronary artery disease. As such, I have recommended cardiac catheterization. We discussed regarding risks, benefits, alternatives to this including medical therapy. Patient wants to proceed. Understands <1-2% risk of death, stroke, MI, urgent CABG, bleeding, infection, renal failure but not limited to these. Pt. verbalized understanding, has been scheduled for cardiac catheterization, and possible angioplasty/stent implant by Dr. [name]. Patient was advised to hold metformin from the day before cardiac catheterization and drink plenty of fluids on the day before procedure.  He also has significantly abnormal lower arterial duplex study and in view of his symptoms, I have recommended lower extremity angiogram. We will schedule for possible LE angiogram with cardiac cath, but, detailed study may have to be done later in view of concern about the dye because of diabetes and kidney function.  Patient's blood pressure is elevated and I have added metoprolol 50 mg daily for blood pressure as well as for possible ischemia.. Continue all the other present medications. He was again encouraged very strongly to abstain from smoking permanently. Patient was also advised to follow ADA, low-salt, low-cholesterol diet and was given dietary instructions. He was advised to continue walking as tolerated.  I will see him in follow-up after cardiac cath.  CC: Dr. Ruben Reason  Signed electronically by Despina Hick, MD (03/27/2015 10:04 AM)

## 2015-04-24 ENCOUNTER — Encounter (HOSPITAL_COMMUNITY): Payer: Self-pay | Admitting: Cardiology

## 2015-04-24 ENCOUNTER — Encounter (HOSPITAL_COMMUNITY)
Admission: RE | Disposition: A | Discharge status: Home / Self Care | Payer: Self-pay | Source: Ambulatory Visit | Attending: Cardiology

## 2015-04-24 ENCOUNTER — Ambulatory Visit (HOSPITAL_COMMUNITY)
Admission: RE | Admit: 2015-04-24 | Discharge: 2015-04-24 | Disposition: A | Discharge status: Home / Self Care | Payer: Medicare Other | Source: Ambulatory Visit | Attending: Cardiology | Admitting: Cardiology

## 2015-04-24 ENCOUNTER — Other Ambulatory Visit: Payer: Self-pay

## 2015-04-24 DIAGNOSIS — E1165 Type 2 diabetes mellitus with hyperglycemia: Secondary | ICD-10-CM | POA: Insufficient documentation

## 2015-04-24 DIAGNOSIS — R931 Abnormal findings on diagnostic imaging of heart and coronary circulation: Secondary | ICD-10-CM | POA: Diagnosis not present

## 2015-04-24 DIAGNOSIS — I739 Peripheral vascular disease, unspecified: Secondary | ICD-10-CM | POA: Diagnosis not present

## 2015-04-24 DIAGNOSIS — E785 Hyperlipidemia, unspecified: Secondary | ICD-10-CM | POA: Insufficient documentation

## 2015-04-24 DIAGNOSIS — R0609 Other forms of dyspnea: Secondary | ICD-10-CM | POA: Diagnosis not present

## 2015-04-24 DIAGNOSIS — I2510 Atherosclerotic heart disease of native coronary artery without angina pectoris: Secondary | ICD-10-CM | POA: Insufficient documentation

## 2015-04-24 DIAGNOSIS — I10 Essential (primary) hypertension: Secondary | ICD-10-CM | POA: Diagnosis not present

## 2015-04-24 DIAGNOSIS — I70213 Atherosclerosis of native arteries of extremities with intermittent claudication, bilateral legs: Secondary | ICD-10-CM | POA: Diagnosis not present

## 2015-04-24 DIAGNOSIS — R079 Chest pain, unspecified: Secondary | ICD-10-CM

## 2015-04-24 DIAGNOSIS — Z87891 Personal history of nicotine dependence: Secondary | ICD-10-CM | POA: Diagnosis not present

## 2015-04-24 HISTORY — DX: Essential (primary) hypertension: I10

## 2015-04-24 HISTORY — DX: Hyperlipidemia, unspecified: E78.5

## 2015-04-24 HISTORY — DX: Anemia, unspecified: D64.9

## 2015-04-24 HISTORY — DX: Atherosclerotic heart disease of native coronary artery without angina pectoris: I25.10

## 2015-04-24 HISTORY — PX: CARDIAC CATHETERIZATION: SHX172

## 2015-04-24 HISTORY — DX: Peripheral vascular disease, unspecified: I73.9

## 2015-04-24 HISTORY — DX: Personal history of other medical treatment: Z92.89

## 2015-04-24 HISTORY — DX: Chronic or unspecified gastric ulcer with hemorrhage: K25.4

## 2015-04-24 HISTORY — DX: Type 2 diabetes mellitus without complications: E11.9

## 2015-04-24 HISTORY — DX: Gastro-esophageal reflux disease without esophagitis: K21.9

## 2015-04-24 HISTORY — PX: PERIPHERAL VASCULAR CATHETERIZATION: SHX172C

## 2015-04-24 LAB — GLUCOSE, CAPILLARY
Glucose-Capillary: 167 mg/dL — ABNORMAL HIGH (ref 65–99)
Glucose-Capillary: 192 mg/dL — ABNORMAL HIGH (ref 65–99)

## 2015-04-24 LAB — POCT ACTIVATED CLOTTING TIME: Activated Clotting Time: 343 seconds

## 2015-04-24 SURGERY — LEFT HEART CATH AND CORONARY ANGIOGRAPHY

## 2015-04-24 MED ORDER — ASPIRIN 81 MG PO CHEW
81.0000 mg | CHEWABLE_TABLET | ORAL | Status: AC
  Administered 2015-04-24: 81 mg via ORAL

## 2015-04-24 MED ORDER — SODIUM CHLORIDE 0.9 % IJ SOLN: 3.0000 mL | Freq: Two times a day (BID) | INTRAMUSCULAR | Status: DC

## 2015-04-24 MED ORDER — PRASUGREL HCL 10 MG PO TABS: 10.0000 mg | ORAL_TABLET | Freq: Every day | ORAL | Status: DC

## 2015-04-24 MED ORDER — LIVING WELL WITH DIABETES BOOK
Freq: Once | Status: AC
  Administered 2015-04-24: 18:00:00
  Filled 2015-04-24: qty 1

## 2015-04-24 MED ORDER — HYDRALAZINE HCL 20 MG/ML IJ SOLN
INTRAMUSCULAR | Status: AC
  Filled 2015-04-24: qty 1

## 2015-04-24 MED ORDER — HEPARIN (PORCINE) IN NACL 2-0.9 UNIT/ML-% IJ SOLN
INTRAMUSCULAR | Status: DC | PRN
  Administered 2015-04-24: 20 mL

## 2015-04-24 MED ORDER — BIVALIRUDIN BOLUS VIA INFUSION - CUPID
INTRAVENOUS | Status: DC | PRN
  Administered 2015-04-24: 66.675 mg via INTRAVENOUS

## 2015-04-24 MED ORDER — SODIUM CHLORIDE 0.9 % IV SOLN
250.0000 mg | INTRAVENOUS | Status: DC | PRN
  Administered 2015-04-24: 1.75 mg/kg/h via INTRAVENOUS

## 2015-04-24 MED ORDER — ASPIRIN 81 MG PO CHEW
CHEWABLE_TABLET | ORAL | Status: AC
  Filled 2015-04-24: qty 1

## 2015-04-24 MED ORDER — HYDROMORPHONE HCL 1 MG/ML IJ SOLN
INTRAMUSCULAR | Status: DC | PRN
  Administered 2015-04-24: 0.5 mg via INTRAVENOUS

## 2015-04-24 MED ORDER — LABETALOL HCL 5 MG/ML IV SOLN
INTRAVENOUS | Status: AC
  Filled 2015-04-24: qty 4

## 2015-04-24 MED ORDER — HYDROMORPHONE HCL 1 MG/ML IJ SOLN
INTRAMUSCULAR | Status: AC
  Filled 2015-04-24: qty 1

## 2015-04-24 MED ORDER — SODIUM CHLORIDE 0.9 % IV SOLN: 250.0000 mL | INTRAVENOUS | Status: DC | PRN

## 2015-04-24 MED ORDER — MIDAZOLAM HCL 2 MG/2ML IJ SOLN
INTRAMUSCULAR | Status: AC
  Filled 2015-04-24: qty 4

## 2015-04-24 MED ORDER — IODIXANOL 320 MG/ML IV SOLN
INTRAVENOUS | Status: DC | PRN
  Administered 2015-04-24: 180 mL via INTRA_ARTERIAL

## 2015-04-24 MED ORDER — METFORMIN HCL 500 MG PO TABS: 500.0000 mg | ORAL_TABLET | Freq: Two times a day (BID) | ORAL | Status: DC

## 2015-04-24 MED ORDER — LIDOCAINE HCL (PF) 1 % IJ SOLN
INTRAMUSCULAR | Status: AC
  Filled 2015-04-24: qty 30

## 2015-04-24 MED ORDER — MIDAZOLAM HCL 2 MG/2ML IJ SOLN
INTRAMUSCULAR | Status: DC | PRN
  Administered 2015-04-24: 2 mg via INTRAVENOUS

## 2015-04-24 MED ORDER — NITROGLYCERIN 0.4 MG SL SUBL: 0.4000 mg | SUBLINGUAL_TABLET | SUBLINGUAL | Status: DC | PRN

## 2015-04-24 MED ORDER — ANGIOPLASTY BOOK
Freq: Once | Status: AC
  Administered 2015-04-24: 18:00:00
  Filled 2015-04-24: qty 1

## 2015-04-24 MED ORDER — SODIUM CHLORIDE 0.9 % WEIGHT BASED INFUSION: 3.0000 mL/kg/h | INTRAVENOUS | Status: AC

## 2015-04-24 MED ORDER — PRASUGREL HCL 10 MG PO TABS
ORAL_TABLET | ORAL | Status: AC
  Filled 2015-04-24: qty 6

## 2015-04-24 MED ORDER — ASPIRIN 81 MG PO TABS: 81.0000 mg | ORAL_TABLET | Freq: Every day | ORAL | Status: AC

## 2015-04-24 MED ORDER — INSULIN ASPART 100 UNIT/ML ~~LOC~~ SOLN: 0.0000 [IU] | Freq: Three times a day (TID) | SUBCUTANEOUS | Status: DC

## 2015-04-24 MED ORDER — LABETALOL HCL 5 MG/ML IV SOLN
INTRAVENOUS | Status: DC | PRN
  Administered 2015-04-24: 15 mL via INTRAVENOUS

## 2015-04-24 MED ORDER — BIVALIRUDIN 250 MG IV SOLR
INTRAVENOUS | Status: AC
  Filled 2015-04-24: qty 250

## 2015-04-24 MED ORDER — SODIUM CHLORIDE 0.9 % WEIGHT BASED INFUSION
3.0000 mL/kg/h | INTRAVENOUS | Status: DC
  Administered 2015-04-24: 3 mL/kg/h via INTRAVENOUS

## 2015-04-24 MED ORDER — SODIUM CHLORIDE 0.9 % IJ SOLN: 3.0000 mL | INTRAMUSCULAR | Status: DC | PRN

## 2015-04-24 MED ORDER — HYDRALAZINE HCL 20 MG/ML IJ SOLN
INTRAMUSCULAR | Status: DC | PRN
  Administered 2015-04-24: 10 mg via INTRAVENOUS

## 2015-04-24 MED ORDER — PRASUGREL HCL 10 MG PO TABS
ORAL_TABLET | ORAL | Status: DC | PRN
  Administered 2015-04-24: 60 mg via ORAL

## 2015-04-24 MED ORDER — SODIUM CHLORIDE 0.9 % WEIGHT BASED INFUSION: 1.0000 mL/kg/h | INTRAVENOUS | Status: DC

## 2015-04-24 MED ORDER — HEPARIN (PORCINE) IN NACL 2-0.9 UNIT/ML-% IJ SOLN
INTRAMUSCULAR | Status: AC
  Filled 2015-04-24: qty 1000

## 2015-04-24 SURGICAL SUPPLY — 19 items
BALLN EUPHORA RX 2.5X15 (BALLOONS) ×2
BALLOON EUPHORA RX 2.5X15 (BALLOONS) ×1 IMPLANT
CATH INFINITI 5FR MPB2 (CATHETERS) ×2 IMPLANT
CATH OMNI FLUSH 5F 65CM (CATHETERS) ×2 IMPLANT
CATH VISTA GUIDE 6FR JR4 (CATHETERS) ×2 IMPLANT
DEVICE CLOSURE PERCLS PRGLD 6F (VASCULAR PRODUCTS) ×1 IMPLANT
KIT ENCORE 26 ADVANTAGE (KITS) ×2 IMPLANT
KIT PV (KITS) ×2 IMPLANT
PERCLOSE PROGLIDE 6F (VASCULAR PRODUCTS) ×2
SHEATH PINNACLE 5F 10CM (SHEATH) ×2 IMPLANT
SHEATH PINNACLE 6F 10CM (SHEATH) ×2 IMPLANT
STENT RESOLUTE INTEG 3.5X18 (Permanent Stent) ×2 IMPLANT
STOPCOCK MORSE 400PSI 3WAY (MISCELLANEOUS) ×2 IMPLANT
SYRINGE MEDRAD AVANTA MACH 7 (SYRINGE) ×2 IMPLANT
TRANSDUCER W/STOPCOCK (MISCELLANEOUS) ×2 IMPLANT
TRAY PV CATH (CUSTOM PROCEDURE TRAY) ×2 IMPLANT
TUBING CIL FLEX 10 FLL-RA (TUBING) ×2 IMPLANT
WIRE COUGAR XT STRL 190CM (WIRE) ×2 IMPLANT
WIRE HITORQ VERSACORE ST 145CM (WIRE) ×2 IMPLANT

## 2015-04-24 NOTE — Progress Notes (Signed)
Discussed cardiac information on stent, cardiac diet, risk factors, medications with emphasis on antiplatelet therapy and nitroglycerin sl. Discussed PVD, complications related to continuing life style as is and not making changes. Discussed diabetes, complications of diabetes, diet. Given living well with diabetes book. Patient attentive. Stated he would review the information in the diabetes and angioplasty book given. Responded well to information given and agreed to consider making changes in his life. Patient has quit smoking and encouraged to continue on the right path to increase healthy choices.

## 2015-04-24 NOTE — Interval H&P Note (Signed)
History and Physical Interval Note:  04/24/2015 7:46 AM  [name]  has presented today for surgery, with the diagnosis of cp/abnormal nuc/pad  The various methods of treatment have been discussed with the patient and family. After consideration of risks, benefits and other options for treatment, the patient has consented to  Procedure(s): Left Heart Cath and Coronary Angiography (N/A) and possible angiplasty. Lower Extremity Angiography (N/A) and possible angioplasty as a surgical intervention .  The patient's history has been reviewed, patient examined, no change in status, stable for surgery.  I have reviewed the patient's chart and labs.  Questions were answered to the patient's satisfaction.   Ischemic Symptoms? CCS III (Marked limitation of ordinary activity) Anti-ischemic Medical Therapy? Minimal Therapy (1 class of medications) Non-invasive Test Results? Intermediate-risk stress test findings: cardiac mortality 1-3%/year Prior CABG? No Previous CABG   Patient Information:   1-2V CAD, no prox LAD  U (6)  Indication: 16; Score: 6   Patient Information:   CTO of 1 vessel, no other CAD  U (6)  Indication: 26; Score: 6   Patient Information:   1V CAD with prox LAD  A (7)  Indication: 32; Score: 7   Patient Information:   2V-CAD with prox LAD  A (8)  Indication: 38; Score: 8   Patient Information:   3V-CAD without LMCA  A (8)  Indication: 44; Score: 8   Patient Information:   3V-CAD without LMCA With Abnormal LV systolic function  A (9)  Indication: 48; Score: 9   Patient Information:   LMCA-CAD  A (9)  Indication: 49; Score: 9   Patient Information:   2V-CAD with prox LAD PCI  A (7)  Indication: 62; Score: 7   Patient Information:   2V-CAD with prox LAD CABG  A (8)  Indication: 62; Score: 8   Patient Information:   3V-CAD without LMCA With Low CAD burden(i.e., 3 focal stenoses, low SYNTAX score) PCI  A (7)  Indication: 63;  Score: 7   Patient Information:   3V-CAD without LMCA With Low CAD burden(i.e., 3 focal stenoses, low SYNTAX score) CABG  A (9)  Indication: 63; Score: 9   Patient Information:   3V-CAD without LMCA E06c - Intermediate-high CAD burden (i.e., multiple diffuse lesions, presence of CTO, or high SYNTAX score) PCI  U (4)  Indication: 64; Score: 4   Patient Information:   3V-CAD without LMCA E06c - Intermediate-high CAD burden (i.e., multiple diffuse lesions, presence of CTO, or high SYNTAX score) CABG  A (9)  Indication: 64; Score: 9   Patient Information:   LMCA-CAD With Isolated LMCA stenosis  PCI  U (6)  Indication: 65; Score: 6   Patient Information:   LMCA-CAD Additional CAD, low CAD burden (i.e., 1- to 2-vessel additional involvement, low SYNTAX score) PCI  U (5)  Indication: 66; Score: 5   Patient Information:   LMCA-CAD Additional CAD, low CAD burden (i.e., 1- to 2-vessel additional involvement, low SYNTAX score) CABG  A (9)  Indication: 66; Score: 9   Patient Information:   LMCA-CAD With Isolated LMCA stenosis  CABG  A (9)  Indication: 66; Score: 9   Patient Information:   LMCA-CAD Additional CAD, intermediate-high CAD burden (i.e., 3-vessel involvement, presence of CTO, or high SYNTAX score) PCI  I (3)  Indication: 67; Score: 3   Patient Information:   LMCA-CAD Additional CAD, intermediate-high CAD burden (i.e., 3-vessel involvement, presence of CTO, or high SYNTAX score) CABG  A (9)  Indication:  67; Score: 9   Joneric Streight

## 2015-04-24 NOTE — Discharge Summary (Signed)
Physician Discharge Summary  Patient ID: [name] MRN: [id number] DOB/AGE: [birth year] 69 y.o.  Admit date: 04/24/2015 Discharge date: 04/24/2015  Primary Discharge Diagnosis 1.  Coronary artery disease of the native vessels, successful PTCA and stenting of the proximal RCA with implantation of a 3.5 x 18 mm resolute integrity DES, 95-99% stenosis reduced to 0%. 2.  Peripheral arterial disease with high-grade greater than 95% stenosis of the right distal SFA and single-vessel runoff in the form of peroneal artery in the right leg with a 70-80% stenosis in the midsegment.  Left SFA has a 70% stenosis in the distal segment and 2 vessel runoff with diffusely diseased left anterior tibial and a patent left peroneal artery.   Secondary Discharge Diagnosis Diabetes mellitus type 2, uncontrolled, HbA1c 10.1% on 04/24/2015 Hypertension Hyperlipidemia Tobacco use disorder  Significant Diagnostic Studies:  Hospital Course: 9/60/2016: Procedure performed: Selective right and left coronary angiography, left ventriculogram, abdominal aortogram, selective left renal arteriogram, abdominal aortogram and bifemoral runoff.  1. PTCA and stenting of the proximal dominant right coronary artery with 3.5 x 18 mm resolute integrity DES, 99% stenosis reduced to 0%. 2. Right SFA 99% stenosis in the distal segment, below the right knee one-vessel runoff in the form of peroneal artery. Midsegment of the peroneal artery has a focal 90% stenosis. The AT and PT reconstitute at the level of the ankle. 3. Left common femoral artery 40-50% stenosis. Left SFA in the distal segment 60-70% stenosis. Below the left knee two-vessel runoff with severely diseased AT with near occlusion in the distal segment and one-vessel peroneal runoff to the level of the ankle with mild disease.  Closure of the left femoral arterial access with Perclose with excellent hemostasis   Recommendations on discharge: Patient admitted on elective  basis for coronary angiography due to abnormal stress test and high risk patient risk factors.  He was also scheduled for lower  Extremity angiogram.  Due to high-grade stenosis of a very large right coronary artery, it is felt that proceeding with angioplasty in a patient that has class III symptoms of shortness of breath was appropriate due to the fact that he had almost a 95-99% proximal RCA, dominant vessel occlusion and also appeared to be unstable lesion. Patient will be continued on Effient at least for a period of one year along with aspirin 81 mg by mouth daily.  He'll need aggressive risk factor modification and continued smoking cessation and abstinence.  He will need elective angioplasty of the right SFA and possibly right peroneal artery, consider atherectomy.  I will set this up on an elective basis. Patient was observed for 6-7 hours in the hospital in the inpatient setting, there were no arrhythmias, remained chest pain-free, ambulated with the help of cardiac rehabilitation without any problems.  Patient has good support system with regard to family and is stable for discharge.  Patient's wife and daughter were present and they're aware of dual antiplatelet therapy and also aware of when to activate EMS.  Discharge Exam: Blood pressure 157/81, pulse 77, temperature 98 F (36.7 C), temperature source Oral, resp. rate 14, height 5' 7.5" (1.715 m), weight 88.905 kg (196 lb), SpO2 100 %.   General appearance: alert, cooperative, appears stated age and no distress Resp: clear to auscultation bilaterally Cardio: regular rate and rhythm, S1, S2 normal and systolic murmur: early systolic 2/6, crescendo at 2nd right intercostal space, at apex GI: soft, non-tender; bowel sounds normal; no masses,  no organomegaly Extremities: extremities normal, atraumatic, no  cyanosis or edema Pulses: bilateral carotids normal, femoral pulses normal, no bruits heard left.  Absent popliteal and pedal pulses. Left  groin site without hematoma. Neurologic: Grossly normal   Labs:   Lab Results  Component Value Date   WBC 7.0 10/17/2014   HGB 14.7 10/17/2014   HCT 43.9 10/17/2014   MCV 93.8 10/17/2014   PLT 254 10/17/2014   Lipid Panel     Component Value Date/Time   CHOL 183 10/17/2014 2014   TRIG 123 10/17/2014 2014   HDL 42 10/17/2014 2014   CHOLHDL 4.4 10/17/2014 2014   VLDL 25 10/17/2014 2014   LDLCALC 116* 10/17/2014 2014    HEMOGLOBIN A1C Lab Results  Component Value Date   HGBA1C 10.1 10/17/2014   MPG 154* 08/26/2010   EKG 9/60/2016: Normal sinus rhythm at rate of 67 bpm with first-degree AV block, nonspecific T-wave flattening in aVL and V6.  Out patient tests:  Lexiscan myoview stress test 02/23/2015: 1. The resting electrocardiogram demonstrated normal sinus rhythm and normal resting conduction. Poor R wave progression. Stress EKG is non diagnostic for ischemia as it is a pharmacologic stress using Lexiscan. Stress symptoms included dyspnea. 2. Left ventricular cavity is noted to be enlarged on the rest and stress studies.  The LV end-diastolic volume was 779ZP. The TID was 1.01 (normal). SPECT images demonstrate homogeneous tracer distribution throughout the myocardium.  The left ventricular ejection fraction was calculated or visually estimated to be 39%.  This represents an intermediate risk scan, in a patient with DM and PAD, Multivessel CAD cannot be excluded. Clinical correlation recommended.  Echo- 03/21/2015 1. Left ventricle cavity is normal in size. Moderate concentric hypertrophy of the left ventricle. Normal global wall motion. Doppler evidence of grade I (impaired) diastolic dysfunction. Calculated EF 57%. 2. Left atrial cavity is normal in size. An aneurysm with a possible patent foramen ovale is present. 3. Trace aortic regurgitation. 4. Mild mitral regurgitation. Mild calcification of the mitral valve annulus. 5. Mild tricuspid regurgitation. No evidence of  pulmonary Hypertension.  Lower extremity arterial duplex 03/22/2015: 1. Significant velocity increase at the right distal superficial femoral artery suggests >50% stenosis. No hemodynamically significant stenoses are identified in the left lower extremity arterial system.Diffuse soft plaque noted bilaterally.  2. This exam reveals moderately decreased perfusion of both the  lower extremities with RABI 0.68 and LABI 0.79, noted at the post tibial artery level.  FOLLOW UP PLANS AND APPOINTMENTS    Medication List    TAKE these medications        aspirin 81 MG tablet  Take 1 tablet (81 mg total) by mouth daily.  Notes to Patient:  Prevents clotting in stent and heart attack     ferrous sulfate 325 (65 FE) MG tablet  Take 1 tablet (325 mg total) by mouth daily with breakfast.  Notes to Patient:  Iron replacement     glimepiride 2 MG tablet  Commonly known as:  AMARYL  Take 1 tablet (2 mg total) by mouth daily before breakfast.  Notes to Patient:  Diabetes (sugar)     lisinopril 10 MG tablet  Commonly known as:  PRINIVIL,ZESTRIL  Take 1 tablet (10 mg total) by mouth daily.  Notes to Patient:  Blood pressure     metFORMIN 500 MG tablet  Commonly known as:  GLUCOPHAGE  Take 1 tablet (500 mg total) by mouth 2 (two) times daily with a meal.  Start taking on:  04/25/2015  Notes to Patient:  Diabetes (sugar)  If taken when you receive dye from cath procedure you could have kidney injury.                                  Do Not Take Until Tomorrow     metoprolol succinate 50 MG 24 hr tablet  Commonly known as:  TOPROL-XL  Take 50 mg by mouth daily. Take with or immediately following a meal.  Notes to Patient:  Decreases work of the heart, heart rate and blood pressure     nitroGLYCERIN 0.4 MG SL tablet  Commonly known as:  NITROSTAT  Place 1 tablet (0.4 mg total) under the tongue every 5 (five) minutes as needed for chest pain.     omeprazole 20 MG capsule  Commonly known as:   PRILOSEC  Take 2 capsules (40 mg total) by mouth daily.  Notes to Patient:  Heartburn     prasugrel 10 MG Tabs tablet  Commonly known as:  EFFIENT  Take 1 tablet (10 mg total) by mouth daily.  Notes to Patient:  Prevents clotting in stent and heart attack           Follow-up Information    Follow up with Despina Hick, MD.   Specialty:  Cardiology   Why:  Keep previous appointment   Contact information:   [facility], [address] [place] [address] [phone]        [name], MD 04/24/2015, 4:56 PM  Pager: [phone] Office: [phone] If no answer: [phone]

## 2015-04-24 NOTE — Care Management Note (Addendum)
Case Management Note  Patient Details  Name: [name] MRN: [id number] Date of Birth: Nov 08, 1945  Subjective/Objective:  Pt plan for d/c today. Plan for home on effient 10 mg. Staff RN to give 30 day free card.                   Action/Plan: [place] has medication available. Unsure of co pay at this time. CM did make pt aware that if co pay is too expensive to contact MD and he may have samples in office. No further needs from CM at this time.    Bethena Roys, RN 04/24/2015, 4:49 PM

## 2015-04-24 NOTE — Care Management Note (Signed)
Case Management Note  Patient Details  Name: [name] MRN: [id number] Date of Birth: [birth year]  Subjective/Objective:        Benefits check, shows no drug coverage.  Will check with patient.             Action/Plan:   Expected Discharge Date:                  Expected Discharge Plan:  [place]  In-House Referral:     Discharge planning Services     Post Acute Care Choice:    Choice offered to:     DME Arranged:    DME Agency:     HH Arranged:    [place] Agency:     Status of Service:  In process, will continue to follow  Medicare Important Message Given:    Date Medicare IM Given:    Medicare IM give by:    Date Additional Medicare IM Given:    Additional Medicare Important Message give by:     If discussed at [place] of Stay Meetings, dates discussed:    Additional Comments:  Vergie Living, RN 04/24/2015, 11:26 AM

## 2015-05-01 ENCOUNTER — Telehealth (HOSPITAL_COMMUNITY): Payer: Self-pay | Admitting: *Deleted

## 2015-05-02 ENCOUNTER — Telehealth: Payer: Self-pay

## 2015-05-02 DIAGNOSIS — I10 Essential (primary) hypertension: Secondary | ICD-10-CM | POA: Diagnosis not present

## 2015-05-02 DIAGNOSIS — E780 Pure hypercholesterolemia: Secondary | ICD-10-CM | POA: Diagnosis not present

## 2015-05-02 DIAGNOSIS — I2510 Atherosclerotic heart disease of native coronary artery without angina pectoris: Secondary | ICD-10-CM | POA: Diagnosis not present

## 2015-05-02 DIAGNOSIS — I739 Peripheral vascular disease, unspecified: Secondary | ICD-10-CM | POA: Diagnosis not present

## 2015-05-02 NOTE — Telephone Encounter (Signed)
Patient needs a referral to the diabetic program at [facility].   Call Daughter Jone Baseman [phone] (M)

## 2015-05-23 DIAGNOSIS — Z87898 Personal history of other specified conditions: Secondary | ICD-10-CM | POA: Diagnosis not present

## 2015-05-23 DIAGNOSIS — I739 Peripheral vascular disease, unspecified: Secondary | ICD-10-CM | POA: Diagnosis not present

## 2015-05-29 ENCOUNTER — Ambulatory Visit (HOSPITAL_COMMUNITY)
Admission: RE | Admit: 2015-05-29 | Discharge: 2015-05-29 | Disposition: A | Discharge status: Home / Self Care | Payer: Medicare Other | Source: Ambulatory Visit | Attending: Cardiology | Admitting: Cardiology

## 2015-05-29 ENCOUNTER — Encounter (HOSPITAL_COMMUNITY)
Admission: RE | Disposition: A | Discharge status: Home / Self Care | Payer: Self-pay | Source: Ambulatory Visit | Attending: Cardiology

## 2015-05-29 DIAGNOSIS — Z87891 Personal history of nicotine dependence: Secondary | ICD-10-CM | POA: Insufficient documentation

## 2015-05-29 DIAGNOSIS — Z6829 Body mass index (BMI) 29.0-29.9, adult: Secondary | ICD-10-CM | POA: Diagnosis not present

## 2015-05-29 DIAGNOSIS — I739 Peripheral vascular disease, unspecified: Secondary | ICD-10-CM | POA: Diagnosis not present

## 2015-05-29 DIAGNOSIS — K219 Gastro-esophageal reflux disease without esophagitis: Secondary | ICD-10-CM | POA: Diagnosis not present

## 2015-05-29 DIAGNOSIS — Z7982 Long term (current) use of aspirin: Secondary | ICD-10-CM | POA: Diagnosis not present

## 2015-05-29 DIAGNOSIS — Z7984 Long term (current) use of oral hypoglycemic drugs: Secondary | ICD-10-CM | POA: Diagnosis not present

## 2015-05-29 DIAGNOSIS — I70211 Atherosclerosis of native arteries of extremities with intermittent claudication, right leg: Secondary | ICD-10-CM | POA: Diagnosis not present

## 2015-05-29 DIAGNOSIS — E7800 Pure hypercholesterolemia, unspecified: Secondary | ICD-10-CM | POA: Diagnosis not present

## 2015-05-29 DIAGNOSIS — I10 Essential (primary) hypertension: Secondary | ICD-10-CM | POA: Diagnosis not present

## 2015-05-29 DIAGNOSIS — E663 Overweight: Secondary | ICD-10-CM | POA: Insufficient documentation

## 2015-05-29 DIAGNOSIS — E1165 Type 2 diabetes mellitus with hyperglycemia: Secondary | ICD-10-CM | POA: Insufficient documentation

## 2015-05-29 DIAGNOSIS — I2510 Atherosclerotic heart disease of native coronary artery without angina pectoris: Secondary | ICD-10-CM | POA: Insufficient documentation

## 2015-05-29 HISTORY — PX: PERIPHERAL VASCULAR CATHETERIZATION: SHX172C

## 2015-05-29 HISTORY — PX: CARDIAC CATHETERIZATION: SHX172

## 2015-05-29 LAB — GLUCOSE, CAPILLARY
Glucose-Capillary: 273 mg/dL — ABNORMAL HIGH (ref 65–99)
Glucose-Capillary: 230 mg/dL — ABNORMAL HIGH (ref 65–99)
Glucose-Capillary: 206 mg/dL — ABNORMAL HIGH (ref 65–99)

## 2015-05-29 LAB — POCT ACTIVATED CLOTTING TIME
Activated Clotting Time: 251 seconds
Activated Clotting Time: 189 seconds
Activated Clotting Time: 165 seconds

## 2015-05-29 SURGERY — LOWER EXTREMITY ANGIOGRAPHY
Anesthesia: LOCAL | Laterality: Right

## 2015-05-29 MED ORDER — FENTANYL CITRATE (PF) 100 MCG/2ML IJ SOLN
INTRAMUSCULAR | Status: DC | PRN
  Administered 2015-05-29: 50 ug via INTRAVENOUS

## 2015-05-29 MED ORDER — NITROGLYCERIN 1 MG/10 ML FOR IR/CATH LAB
INTRA_ARTERIAL | Status: DC | PRN
  Administered 2015-05-29: 11:00:00

## 2015-05-29 MED ORDER — LABETALOL HCL 5 MG/ML IV SOLN
INTRAVENOUS | Status: AC
  Filled 2015-05-29: qty 4

## 2015-05-29 MED ORDER — HEPARIN SODIUM (PORCINE) 1000 UNIT/ML IJ SOLN
INTRAMUSCULAR | Status: DC | PRN
  Administered 2015-05-29: 1500 [IU] via INTRAVENOUS
  Administered 2015-05-29: 6000 [IU] via INTRAVENOUS

## 2015-05-29 MED ORDER — SODIUM CHLORIDE 0.9 % IV BOLUS (SEPSIS)
500.0000 mL | Freq: Once | INTRAVENOUS | Status: AC
  Administered 2015-05-29: 500 mL via INTRAVENOUS

## 2015-05-29 MED ORDER — HEPARIN (PORCINE) IN NACL 2-0.9 UNIT/ML-% IJ SOLN
INTRAMUSCULAR | Status: AC
  Filled 2015-05-29: qty 1000

## 2015-05-29 MED ORDER — LABETALOL HCL 5 MG/ML IV SOLN
INTRAVENOUS | Status: DC | PRN
  Administered 2015-05-29: 10 mg via INTRAVENOUS

## 2015-05-29 MED ORDER — LISINOPRIL 10 MG PO TABS: 10.0000 mg | ORAL_TABLET | Freq: Every day | ORAL | Status: DC

## 2015-05-29 MED ORDER — MIDAZOLAM HCL 2 MG/2ML IJ SOLN
INTRAMUSCULAR | Status: AC
  Filled 2015-05-29: qty 4

## 2015-05-29 MED ORDER — HYDRALAZINE HCL 20 MG/ML IJ SOLN
INTRAMUSCULAR | Status: DC | PRN
  Administered 2015-05-29: 10 mg via INTRAVENOUS

## 2015-05-29 MED ORDER — NITROGLYCERIN 1 MG/10 ML FOR IR/CATH LAB
INTRA_ARTERIAL | Status: DC | PRN
  Administered 2015-05-29: 400 ug via INTRA_ARTERIAL

## 2015-05-29 MED ORDER — OXYCODONE-ACETAMINOPHEN 5-325 MG PO TABS
2.0000 | ORAL_TABLET | Freq: Once | ORAL | Status: AC
  Administered 2015-05-29: 2 via ORAL

## 2015-05-29 MED ORDER — LIDOCAINE HCL (PF) 1 % IJ SOLN
INTRAMUSCULAR | Status: DC | PRN
  Administered 2015-05-29: 20 mL

## 2015-05-29 MED ORDER — SODIUM CHLORIDE 0.9 % IV SOLN
INTRAVENOUS | Status: DC
Start: 2015-05-29 — End: 2015-05-29

## 2015-05-29 MED ORDER — METFORMIN HCL 500 MG PO TABS: 500.0000 mg | ORAL_TABLET | Freq: Two times a day (BID) | ORAL | Status: DC

## 2015-05-29 MED ORDER — HYDRALAZINE HCL 20 MG/ML IJ SOLN
10.0000 mg | INTRAMUSCULAR | Status: DC | PRN
  Administered 2015-05-29: 10 mg via INTRAVENOUS

## 2015-05-29 MED ORDER — SODIUM CHLORIDE 0.9 % IV SOLN: 1.0000 mL/kg/h | INTRAVENOUS | Status: DC

## 2015-05-29 MED ORDER — OXYCODONE-ACETAMINOPHEN 5-325 MG PO TABS
ORAL_TABLET | ORAL | Status: AC
  Filled 2015-05-29: qty 2

## 2015-05-29 MED ORDER — MIDAZOLAM HCL 2 MG/2ML IJ SOLN
INTRAMUSCULAR | Status: DC | PRN
  Administered 2015-05-29: 2 mg via INTRAVENOUS

## 2015-05-29 MED ORDER — HYDRALAZINE HCL 20 MG/ML IJ SOLN
INTRAMUSCULAR | Status: AC
  Filled 2015-05-29: qty 1

## 2015-05-29 MED ORDER — NITROGLYCERIN IN D5W 200-5 MCG/ML-% IV SOLN
INTRAVENOUS | Status: AC
  Filled 2015-05-29: qty 250

## 2015-05-29 MED ORDER — FENTANYL CITRATE (PF) 100 MCG/2ML IJ SOLN
INTRAMUSCULAR | Status: AC
  Filled 2015-05-29: qty 4

## 2015-05-29 MED ORDER — LIDOCAINE HCL (PF) 1 % IJ SOLN
INTRAMUSCULAR | Status: AC
  Filled 2015-05-29: qty 30

## 2015-05-29 SURGICAL SUPPLY — 15 items
BALLN CHOCOLATE 6.0X120X120 (BALLOONS) ×4
BALLN LUTONIX 6X150X130 (BALLOONS) ×4
BALLOON CHOCOLATE 6.0X120X120 (BALLOONS) ×3 IMPLANT
BALLOON LUTONIX 6X150X130 (BALLOONS) ×3 IMPLANT
BALLOON SABER 3.0X100X150 (BALLOONS) ×4 IMPLANT
CATH CROSS OVER TEMPO 5F (CATHETERS) ×4 IMPLANT
KIT ENCORE 26 ADVANTAGE (KITS) ×4 IMPLANT
KIT MICROINTRODUCER STIFF 5F (SHEATH) ×4 IMPLANT
KIT PV (KITS) ×4 IMPLANT
SHEATH FLEXOR ANSEL 1 7F 45CM (SHEATH) ×4 IMPLANT
SYR MEDRAD MARK V 150ML (SYRINGE) ×4 IMPLANT
TRANSDUCER W/STOPCOCK (MISCELLANEOUS) ×4 IMPLANT
TRAY PV CATH (CUSTOM PROCEDURE TRAY) ×4 IMPLANT
WIRE HITORQ VERSACORE ST 145CM (WIRE) ×4 IMPLANT
WIRE SPARTACORE .014X300CM (WIRE) ×4 IMPLANT

## 2015-05-29 NOTE — H&P (Signed)
OFFICE VISIT NOTES COPIED TO EPIC FOR DOCUMENTATION   Ab Leaming 05/26/15 8:16 AM Location: [facility] Patient #: [id number] DOB: [birth year] Married / Language: English / Race: Black or African American Male   History of Present Illness Andria Frames K. Vyas MD; May 26, 2015 5:10 PM) Patient words: 7-10 day F/U post cath.  The patient is a 69 year old male who presents for a follow-up for Coronary artery disease. Mr. Vanallen is 69 years old African-American male. No history of chest pain since the last visit. No history of shortness of breath, orthopnea or PND. He complains of feeling tired after doing yard work. No complaints of swelling on the legs. Patient has complaints of "tired feeling and tightness in the right leg on walking" or doing yard work etc. He has also felt numbness and tightness in the left leg off and on.  He denies feeling any palpitations, sudden heart racing or irregular heart beat. There is no history of dizziness, near-syncope or syncope at any time.  Patient has hypertension, diabetes mellitus type 2 and hypercholesterolemia. He had smoked half pack of cigars daily for almost 50 years, he has quit smoking in June, 2016. 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  No history of thyroid problems. No history of TIA or CVA.    Problem List/Past Medical [name]; 05/26/15 1:49 PM) History of chest pain (E93.810) Nuclear stress test 02/23/2015 1. The resting electrocardiogram demonstrated normal sinus rhythm and normal resting conduction. Poor R wave progression. Stress EKG is non diagnostic for ischemia as it is a pharmacologic stress using Lexiscan. Stress symptoms included dyspnea. 2. Left ventricular cavity is noted to be enlarged on the rest and stress studies. The LV end-diastolic volume was 175ZW. The TID was 1.01 (normal). SPECT images demonstrate homogeneous tracer distribution throughout the myocardium.  The left ventricular ejection fraction was calculated or visually estimated to be 39%. This represents an intermediate risk scan, in a patient with DM and PAD, Multivessel CAD cannot be excluded. Clinical correlation recommended. Abnormal EKG (R94.31) Benign essential hypertension (I10) Echocardiogram 03/21/2015 1. Left ventricle cavity is normal in size. Moderate concentric hypertrophy of the left ventricle. Normal global wall motion. Doppler evidence of grade I (impaired) diastolic dysfunction. Calculated EF 57%. 2. Left atrial cavity is normal in size. An aneurysm with a possible patent foramen ovale is present. 3. Trace aortic regurgitation. 4. Mild mitral regurgitation. Mild calcification of the mitral valve annulus. 5. Mild tricuspid regurgitation. No evidence of pulmonary hypertension. PAD (peripheral artery disease) (I73.9) Lower Extremity Dopplers 03/22/2015 1. Significant velocity increase at the right distal superficial femoral artery suggests >50% stenosis. No hemodynamically significant stenoses are identified in the left lower extremity arterial system.Diffuse soft plaque noted bilaterally. 2. This exam reveals moderately decreased perfusion of both the lower extremities with RABI 0.68 and LABI 0.79, noted at the post tibial artery level. Hypercholesterolemia (E78.0) BMI 29.0-29.9,adult (Z68.29) Dizziness (R42) GERD (gastroesophageal reflux disease) (K21.9) Diabetes type 2, uncontrolled (E11.65) Peptic ulcer (K27.9)  Allergies ([name]; 05-26-2015 1:49 PM) No Known Drug Allergies06/22/2016  Family History [name]; May 26, 2015 1:49 PM) Mother Deceased. at age 79 from Old Age; no known heart conditions Father Deceased. at age 81, unknown reason Sister 4 2-Older; 1-Younger; no known heart conditons Brother 2 Younger; no known heart conditions  Social History ([place] Reader; 05-26-15 1:49 PM) Current tobacco use Former smoker. Quit January 17, 2015 Alcohol Use Occasional alcohol use. Marital status Married.  Number of Children 2. Living Situation Lives with spouse.  Past Surgical History [name]; 05/02/2015 1:49 PM) Hemorrhoidectomy1987  Medication History [name]; 05/02/2015 1:57 PM) Effient (10MG  Tablet, 1 Tablet Oral daily, Taken starting 04/25/2015) Active. Nitrostat (0.4MG  Tab Sublingual, place 1 tablet sublingual Tab Sublingual every 5 minutes as needed for chest pain., Taken starting 04/25/2015) Active. Metoprolol Tartrate (25MG  Tablet, 1 (one) Tablet Oral two times daily, Taken starting 03/27/2015) Active. (Please disregard Rx for Metoprolol Succinate!!) Atorvastatin Calcium (40MG  Tablet, 1 (one) Tablet Tablet Oral daily, Taken starting 02/07/2015) Active. Lisinopril (20MG  Tablet, 1 Tablet Tablet Oral daily, Taken starting 02/07/2015) Active. Amaryl (2MG  Tablet, Oral) Active. MetFORMIN HCl (500MG  Tablet, 1/2 Oral two times daily) Active. Omeprazole (20MG  Tablet DR, 1 Oral daily) Active. Iron (18MG  Tablet ER, 1 Oral daily) Active. Aspirin EC (81MG  Tablet DR, 1 Oral daily) Active. Medications Reconciled  Diagnostic Studies History [name]; 05/02/2015 8:17 AM) Coronary Angiogram09/01/2015 Proximal RCA 3.5x18 mm Resolute DES. Mild diffuse disease in other vessels. LVEF 55-60%. 99% right SFA stenosis, Peroneal single vessel r/o below right knee. Left SFA 70% distal and two vessel r/o with diffusely diseased AT and large peroneal below knee.    Review of Systems Andria Frames K. Vyas MD; 05/02/2015 5:04 PM)  Note: GENERAL- Feels tired on exertion, No fever, chills. No recent weight change. CARDIO VASCULAR- No chest pain, No shortness of breath, orthopnea or PND. No palpitation, Has dizziness, No fainting. Has hypertension and high cholesterol. No swelling on legs. Has claudication in leg, No cramps. No h/o DVT PULMONARY- No cough, phlegm, wheezing, not feeling congested in  chest. GASTROINTESTINAL- No abdominal pain, nausea, vomiting or diarrhea. No dark tarry stools. Normal appetite. Has heartburn. ENDOCRINE- No Thyroid problem, No feeling of excessive heat or cold, No polydipsia or polyuria. Has Diabetes. NEUROLOGICAL- No focal motor or sensory symptoms, Good coordination. No seizures. MUSCULOSKELETAL- No generalized myalgias or muscle weakness. No joint swelling SKIN- No skin rash, No pruritus HEMATOLOGY- No anemia, petechiae, excessive bruising, epistaxis, GI bleed or any abnormal bleeding.   Vitals [name]; 05/02/2015 2:02 PM) 05/02/2015 1:48 PM Weight: 193 lb Height: 68in Body Surface Area: 2.01 m Body Mass Index: 29.35 kg/m  Pulse: 79 (Regular)  P.OX: 98% (Room air) BP: 134/76 (Sitting, Left Arm, Standard)       Physical Exam Andria Frames K. Vyas MD; 05/02/2015 5:06 PM) The physical exam findings are as follows: Note:GENERAL APPEARANCE- Alert, Oriented. Well built, overweight. HEENT- Unremarkable, fundi were not examined. NECK- No JVD. Carotid pulses are 2+, No bruits audible. No thyromegaly. No lymphadenopathy. HEART- Auscultation- Normal S1, S2. No gallops. Grade 2/6 soft mid systolic murmur is audible at the apex. Grade 2/6 ESM in aortic area and LPSA. CHEST- Normal shape. Normal percussion. Auscultation- Normal breath sounds, No crepitations. No wheezing. ABDOMEN- Palpation- Soft, Nontender. No hepatosplenomegaly. No masses felt. Auscultation- No bruits audible. EXTREMITIES- No Clubbing or Cyanosis. No edema on legs or feet. PERIPHERAL PULSES- Both femoral pulses- 2+. Bruits are audible over both femoral arteries, louder on the left side. Right dorsalis pedis is not palpable, left dorsalis pedis is faint. Both posterior tibial arteries are also faint. Site of catheterization in the left femoral artery is well healed.    Assessment & Plan Andria Frames K. Vyas MD; 05/02/2015 5:21 PM) Atherosclerosis of native coronary  artery of native heart without angina pectoris (I25.10) Story: Coronary angiogram 04/24/2015: Proximal RCA 3.5x18 mm Resolute integrity DES. Mild diffuse disease in other vessels. LVEF 55-60%. Current Plans Complete electrocardiogram (93000) PAD (  peripheral artery disease) (I73.9) Story: 99% right SFA stenosis, Peroneal single vessel r/o below right knee. Left SFA 70% distal and two vessel r/o with diffusely diseased AT and large peroneal below knee.  Lower Extremity Dopplers 03/22/2015 1. Significant velocity increase at the right distal superficial femoral artery suggests >50% stenosis. No hemodynamically significant stenoses are identified in the left lower extremity arterial system.Diffuse soft plaque noted bilaterally. 2. This exam reveals moderately decreased perfusion of both the lower extremities with RABI 0.68 and LABI 0.79, noted at the post tibial artery level. Future Plans 07/18/9757: METABOLIC PANEL, BASIC (83254) - one time 05/22/2015: CBC & PLATELETS (AUTO) [id number]) - one time 15/83/0940: METABOLIC PANEL, BASIC (76808) - one time 06/05/2015: CBC & PLATELETS (AUTO) (81103) - one time Benign essential hypertension (I10) Story: Echocardiogram 03/21/2015 1. Left ventricle cavity is normal in size. Moderate concentric hypertrophy of the left ventricle. Normal global wall motion. Doppler evidence of grade I (impaired) diastolic dysfunction. Calculated EF 57%. 2. Left atrial cavity is normal in size. An aneurysm with a possible patent foramen ovale is present. 3. Trace aortic regurgitation. 4. Mild mitral regurgitation. Mild calcification of the mitral valve annulus. 5. Mild tricuspid regurgitation. No evidence of pulmonary hypertension. Hypercholesterolemia (E78.0)  Note:EKG revealed NSR, Poss. old ASMI, Nonsp. T wave abnormality.  Results of cardiac catheterization, stent implant and peripheral vascular angiogram were explained to the patient. His cardiac status is stable, there is no  angina. I have advised him to continue all the present medications.  In view of results of PV angiogram and patient's symptoms, he has been scheduled for a right femoral angioplasty. We will scheduled for left femoral angioplasty at a later date. Patient was advised to stop metformin from the day before the catheterization.  Patient's blood pressure is controlled. He was advised to continue all the present medications. He was again encouraged very strongly to abstain from smoking permanently. Patient was also advised to follow ADA, low-salt, low-cholesterol diet and was given dietary instructions. He was advised to continue walking as tolerated.  I will see him in follow-up after femoral angioplasty.  CC: Dr. Ruben Reason  Signed electronically by Despina Hick, MD (05/02/2015 5:22 PM)

## 2015-05-29 NOTE — Progress Notes (Signed)
Site area: lt groin Site Prior to Removal:  Level 0 Pressure Applied For:23 minutes Manual:   yes Patient Status During Pull:  awake Post Pull Site:  Level 0 Post Pull Instructions Given:yes   Post Pull Pulses Present: lt dp/pt doppler Dressing Applied: yes  Bedrest begins @ 13:30:00 Comments:

## 2015-05-29 NOTE — Discharge Instructions (Signed)

## 2015-05-29 NOTE — Interval H&P Note (Signed)
History and Physical Interval Note:  05/29/2015 10:42 AM  [name]  has presented today for surgery, with the diagnosis of pad  The various methods of treatment have been discussed with the patient and family. After consideration of risks, benefits and other options for treatment, the patient has consented to  Procedure(s): Lower Extremity Angiography (N/A) and possible angioplasty as a surgical intervention .  The patient's history has been reviewed, patient examined, no change in status, stable for surgery.  I have reviewed the patient's chart and labs.  Questions were answered to the patient's satisfaction.     [name]

## 2015-05-30 ENCOUNTER — Encounter (HOSPITAL_COMMUNITY): Payer: Self-pay | Admitting: Cardiology

## 2015-06-05 ENCOUNTER — Encounter (HOSPITAL_COMMUNITY): Payer: Self-pay | Admitting: Cardiology

## 2015-06-06 ENCOUNTER — Telehealth: Payer: Self-pay | Admitting: Family Medicine

## 2015-06-06 DIAGNOSIS — Z87898 Personal history of other specified conditions: Secondary | ICD-10-CM | POA: Diagnosis not present

## 2015-06-06 DIAGNOSIS — I739 Peripheral vascular disease, unspecified: Secondary | ICD-10-CM | POA: Diagnosis not present

## 2015-06-06 NOTE — Telephone Encounter (Signed)
SPOKE WITH PATIENT AND HE HAS BEEN SEEING DR. Nadyne Coombes FOR ALL OF HIS MEDICAL CARE.  HE STATED THAT WE REFERRED HIM TO THAT OFFICE AND HE JUST PREFERS TO SEE ONE PROVIDER FOR EVERYTHING.  IF HE GETS SICK OR NEEDS ACUTE CARE, HE WILL COME SEE [place].

## 2015-06-11 NOTE — H&P (Signed)
OFFICE VISIT NOTES COPIED TO EPIC FOR DOCUMENTATION  The patient is a 69 year old male who presents for a follow-up for Coronary artery disease. And PAD.   He underwent Peripheral arteriogram 05/30/2015: Right SFA99% to <10% with 6x100 mm Chocolate balloon followed by [facility] angioplasty, Peroneal 80% to 0% with 3 mm Saber balloon,  single vessel r/o below right knee.   Left SFA 70% distal and two vessel r/o with diffusely diseased AT(salvagable) and large peroneal below knee.  Now scheduled for elective let leg angiogram and possible angioplasty. Patient has hypertension, diabetes mellitus type 2 and hypercholesterolemia. He had smoked half pack of cigars daily for almost 50 years, he has quit smoking in June, 2016. No history of thyroid problems. No history of TIA or CVA.    Problem List/Past Medical [name]; 05-18-15 1:49 PM) History of chest pain (M08.676) Nuclear stress test 02/23/2015 1. The resting electrocardiogram demonstrated normal sinus rhythm and normal resting conduction. Poor R wave progression. Stress EKG is non diagnostic for ischemia as it is a pharmacologic stress using Lexiscan. Stress symptoms included dyspnea. 2. Left ventricular cavity is noted to be enlarged on the rest and stress studies. The LV end-diastolic volume was 195KD. The TID was 1.01 (normal). SPECT images demonstrate homogeneous tracer distribution throughout the myocardium. The left ventricular ejection fraction was calculated or visually estimated to be 39%. This represents an intermediate risk scan, in a patient with DM and PAD, Multivessel CAD cannot be excluded. Clinical correlation recommended. Abnormal EKG (R94.31) Benign essential hypertension (I10) Echocardiogram 03/21/2015 1. Left ventricle cavity is normal in size. Moderate concentric hypertrophy of the left ventricle. Normal global wall motion. Doppler evidence of grade I (impaired) diastolic dysfunction. Calculated EF 57%. 2. Left atrial  cavity is normal in size. An aneurysm with a possible patent foramen ovale is present. 3. Trace aortic regurgitation. 4. Mild mitral regurgitation. Mild calcification of the mitral valve annulus. 5. Mild tricuspid regurgitation. No evidence of pulmonary hypertension. PAD (peripheral artery disease) (I73.9) Lower Extremity Dopplers 03/22/2015 1. Significant velocity increase at the right distal superficial femoral artery suggests >50% stenosis. No hemodynamically significant stenoses are identified in the left lower extremity arterial system.Diffuse soft plaque noted bilaterally. 2. This exam reveals moderately decreased perfusion of both the lower extremities with RABI 0.68 and LABI 0.79, noted at the post tibial artery level. Hypercholesterolemia (E78.0) BMI 29.0-29.9,adult (Z68.29) Dizziness (R42) GERD (gastroesophageal reflux disease) (K21.9) Diabetes type 2, uncontrolled (E11.65) Peptic ulcer (K27.9)  Allergies ([name]; 05-18-2015 1:49 PM) No Known Drug Allergies06/22/2016  Family History [name]; 2015-05-18 1:49 PM) Mother Deceased. at age 38 from Old Age; no known heart conditions Father Deceased. at age 46, unknown reason Sister 4 2-Older; 1-Younger; no known heart conditons Brother 2 Younger; no known heart conditions  Social History (Chandler Reader; 05/18/15 1:49 PM) Current tobacco use Former smoker. Quit January 17, 2015 Alcohol Use Occasional alcohol use. Marital status Married. Number of Children 2. Living Situation Lives with spouse.  Past Surgical History [name]; 2015-05-18 1:49 PM) Hemorrhoidectomy1987  Medication History [name]; 2015-05-18 1:57 PM) Effient (10MG  Tablet, 1 Tablet Oral daily, Taken starting 04/25/2015) Active. Nitrostat (0.4MG  Tab Sublingual, place 1 tablet sublingual Tab Sublingual every 5 minutes as needed for chest pain., Taken starting 04/25/2015) Active. Metoprolol Tartrate (25MG  Tablet, 1  (one) Tablet Oral two times daily, Taken starting 03/27/2015) Active. (Please disregard Rx for Metoprolol Succinate!!) Atorvastatin Calcium (40MG  Tablet, 1 (one) Tablet Tablet Oral daily, Taken starting 02/07/2015) Active. Lisinopril (20MG   Tablet, 1 Tablet Tablet Oral daily, Taken starting 02/07/2015) Active. Amaryl (2MG  Tablet, Oral) Active. MetFORMIN HCl (500MG  Tablet, 1/2 Oral two times daily) Active. Omeprazole (20MG  Tablet DR, 1 Oral daily) Active. Iron (18MG  Tablet ER, 1 Oral daily) Active. Aspirin EC (81MG  Tablet DR, 1 Oral daily) Active. Medications Reconciled  Diagnostic Studies History [name]; 05/02/2015 8:17 AM) Coronary Angiogram09/01/2015 Proximal RCA 3.5x18 mm Resolute DES. Mild diffuse disease in other vessels. LVEF 55-60%. 99% right SFA stenosis, Peroneal single vessel r/o below right knee. Left SFA 70% distal and two vessel r/o with diffusely diseased AT and large peroneal below knee.    Review of Systems Andria Frames K. Vyas MD; 05/02/2015 5:04 PM)  Note: GENERAL- Feels tired on exertion, No fever, chills. No recent weight change. CARDIO VASCULAR- No chest pain, No shortness of breath, orthopnea or PND. No palpitation, Has dizziness, No fainting. Has hypertension and high cholesterol. No swelling on legs. Has claudication in leg, No cramps. No h/o DVT PULMONARY- No cough, phlegm, wheezing, not feeling congested in chest. GASTROINTESTINAL- No abdominal pain, nausea, vomiting or diarrhea. No dark tarry stools. Normal appetite. Has heartburn. ENDOCRINE- No Thyroid problem, No feeling of excessive heat or cold, No polydipsia or polyuria. Has Diabetes. NEUROLOGICAL- No focal motor or sensory symptoms, Good coordination. No seizures. MUSCULOSKELETAL- No generalized myalgias or muscle weakness. No joint swelling SKIN- No skin rash, No pruritus HEMATOLOGY- No anemia, petechiae, excessive bruising, epistaxis, GI bleed or any abnormal bleeding.   Vitals  [name]; 05/02/2015 2:02 PM) 05/02/2015 1:48 PM Weight: 193 lb Height: 68in Body Surface Area: 2.01 m Body Mass Index: 29.35 kg/m  Pulse: 79 (Regular)  P.OX: 98% (Room air) BP: 134/76 (Sitting, Left Arm, Standard)       Physical Exam Andria Frames K. Vyas MD; 05/02/2015 5:06 PM) The physical exam findings are as follows: Note:GENERAL APPEARANCE- Alert, Oriented. Well built, overweight. HEENT- Unremarkable, fundi were not examined. NECK- No JVD. Carotid pulses are 2+, No bruits audible. No thyromegaly. No lymphadenopathy. HEART- Auscultation- Normal S1, S2. No gallops. Grade 2/6 soft mid systolic murmur is audible at the apex. Grade 2/6 ESM in aortic area and LPSA. CHEST- Normal shape. Normal percussion. Auscultation- Normal breath sounds, No crepitations. No wheezing. ABDOMEN- Palpation- Soft, Nontender. No hepatosplenomegaly. No masses felt. Auscultation- No bruits audible. EXTREMITIES- No Clubbing or Cyanosis. No edema on legs or feet. PERIPHERAL PULSES- Both femoral pulses- 2+. Bruits are audible over both femoral arteries, louder on the left side. Right dorsalis pedis is not palpable, left dorsalis pedis is faint. Both posterior tibial arteries are also faint. Site of catheterization in the left femoral artery is well healed.    Assessment & Plan:  1. Atherosclerosis of native coronary artery of native heart without angina pectoris. Coronary angiogram 04/24/2015: Proximal RCA 3.5x18 mm Resolute integrity DES. Mild diffuse disease in other vessels. LVEF 55-60%. 2. PAD (peripheral artery disease)  Peripheral arteriogram 05/30/2015: Right SFA99% to <10% with 6x100 mm Chocolate balloon followed by [facility] angioplasty, Peroneal 80% to 0% with 3 mm Saber balloon,  single vessel r/o below right knee.   Left SFA 70% distal and two vessel r/o with diffusely diseased AT(salvagable) and large peroneal below knee.  Lower Extremity Dopplers 03/22/2015  1. Significant  velocity increase at the right distal superficial femoral artery suggests >50% stenosis. No hemodynamically significant stenoses are identified in the left lower extremity arterial system.Diffuse soft plaque noted bilaterally. 2. This exam reveals moderately decreased perfusion of both the lower extremities with RABI 0.68 and  LABI 0.79, noted at the post tibial artery level.  Recommendation: Due to symptoms of claudication, also for limb preservation, patient has 1 vessel runoff, the left diffusely diseased anterior tibial is salvageable and will potentially give him the benefit of having 2 vessel runoff in a patient was started diabetes mellitus and severe PAD.  Hence patient has been scheduled for leg angioplasty on and 20/12/2014.  Office visit after the procedure.

## 2015-06-12 ENCOUNTER — Encounter (HOSPITAL_COMMUNITY)
Admission: RE | Disposition: A | Discharge status: Home / Self Care | Payer: Self-pay | Source: Ambulatory Visit | Attending: Cardiology

## 2015-06-12 ENCOUNTER — Ambulatory Visit (HOSPITAL_COMMUNITY)
Admission: RE | Admit: 2015-06-12 | Discharge: 2015-06-12 | Disposition: A | Discharge status: Home / Self Care | Payer: Medicare Other | Source: Ambulatory Visit | Attending: Cardiology | Admitting: Cardiology

## 2015-06-12 DIAGNOSIS — K219 Gastro-esophageal reflux disease without esophagitis: Secondary | ICD-10-CM | POA: Diagnosis not present

## 2015-06-12 DIAGNOSIS — I739 Peripheral vascular disease, unspecified: Secondary | ICD-10-CM | POA: Diagnosis present

## 2015-06-12 DIAGNOSIS — I10 Essential (primary) hypertension: Secondary | ICD-10-CM | POA: Insufficient documentation

## 2015-06-12 DIAGNOSIS — E1151 Type 2 diabetes mellitus with diabetic peripheral angiopathy without gangrene: Secondary | ICD-10-CM | POA: Insufficient documentation

## 2015-06-12 DIAGNOSIS — E1165 Type 2 diabetes mellitus with hyperglycemia: Secondary | ICD-10-CM | POA: Diagnosis not present

## 2015-06-12 DIAGNOSIS — Z955 Presence of coronary angioplasty implant and graft: Secondary | ICD-10-CM | POA: Insufficient documentation

## 2015-06-12 DIAGNOSIS — Z87891 Personal history of nicotine dependence: Secondary | ICD-10-CM | POA: Insufficient documentation

## 2015-06-12 DIAGNOSIS — I2510 Atherosclerotic heart disease of native coronary artery without angina pectoris: Secondary | ICD-10-CM | POA: Insufficient documentation

## 2015-06-12 DIAGNOSIS — E7800 Pure hypercholesterolemia, unspecified: Secondary | ICD-10-CM | POA: Diagnosis not present

## 2015-06-12 DIAGNOSIS — Z7984 Long term (current) use of oral hypoglycemic drugs: Secondary | ICD-10-CM | POA: Diagnosis not present

## 2015-06-12 DIAGNOSIS — Z7982 Long term (current) use of aspirin: Secondary | ICD-10-CM | POA: Diagnosis not present

## 2015-06-12 HISTORY — PX: PERIPHERAL VASCULAR CATHETERIZATION: SHX172C

## 2015-06-12 LAB — GLUCOSE, CAPILLARY
Glucose-Capillary: 240 mg/dL — ABNORMAL HIGH (ref 65–99)
Glucose-Capillary: 239 mg/dL — ABNORMAL HIGH (ref 65–99)

## 2015-06-12 LAB — POCT ACTIVATED CLOTTING TIME
Activated Clotting Time: 239 seconds
Activated Clotting Time: 251 seconds
Activated Clotting Time: 257 seconds
Activated Clotting Time: 177 seconds
Activated Clotting Time: 171 seconds

## 2015-06-12 SURGERY — PERIPHERAL VASCULAR ATHERECTOMY

## 2015-06-12 MED ORDER — VERAPAMIL HCL 2.5 MG/ML IV SOLN
INTRAVENOUS | Status: DC | PRN
  Administered 2015-06-12: 300 ug via INTRAVENOUS
  Administered 2015-06-12 (×3): 200 ug via INTRAVENOUS

## 2015-06-12 MED ORDER — SODIUM CHLORIDE 0.9 % IV SOLN: INTRAVENOUS | Status: DC

## 2015-06-12 MED ORDER — HYDROMORPHONE HCL 1 MG/ML IJ SOLN
INTRAMUSCULAR | Status: AC
  Filled 2015-06-12: qty 1

## 2015-06-12 MED ORDER — LABETALOL HCL 5 MG/ML IV SOLN
10.0000 mg | Freq: Once | INTRAVENOUS | Status: AC
  Administered 2015-06-12: 10 mg via INTRAVENOUS

## 2015-06-12 MED ORDER — LABETALOL HCL 5 MG/ML IV SOLN
INTRAVENOUS | Status: AC
  Filled 2015-06-12: qty 4

## 2015-06-12 MED ORDER — MIDAZOLAM HCL 2 MG/2ML IJ SOLN
INTRAMUSCULAR | Status: AC
  Filled 2015-06-12: qty 4

## 2015-06-12 MED ORDER — HYDRALAZINE HCL 20 MG/ML IJ SOLN
10.0000 mg | INTRAMUSCULAR | Status: AC
  Administered 2015-06-12: 10 mg via INTRAVENOUS

## 2015-06-12 MED ORDER — HYDRALAZINE HCL 20 MG/ML IJ SOLN
INTRAMUSCULAR | Status: AC
  Filled 2015-06-12: qty 1

## 2015-06-12 MED ORDER — LIDOCAINE HCL (PF) 1 % IJ SOLN
INTRAMUSCULAR | Status: DC | PRN
  Administered 2015-06-12: 25 mL

## 2015-06-12 MED ORDER — IODIXANOL 320 MG/ML IV SOLN
INTRAVENOUS | Status: DC | PRN
  Administered 2015-06-12: 120 mL via INTRAVENOUS

## 2015-06-12 MED ORDER — HYDROMORPHONE HCL 1 MG/ML IJ SOLN
INTRAMUSCULAR | Status: DC | PRN
  Administered 2015-06-12 (×3): 0.5 mg via INTRAVENOUS

## 2015-06-12 MED ORDER — NITROGLYCERIN IN D5W 200-5 MCG/ML-% IV SOLN
INTRAVENOUS | Status: AC
  Filled 2015-06-12: qty 250

## 2015-06-12 MED ORDER — SODIUM CHLORIDE 0.9 % IV BOLUS (SEPSIS)
500.0000 mL | Freq: Once | INTRAVENOUS | Status: AC
  Administered 2015-06-12: 500 mL via INTRAVENOUS

## 2015-06-12 MED ORDER — NITROGLYCERIN 0.4 MG SL SUBL: 0.4000 mg | SUBLINGUAL_TABLET | SUBLINGUAL | Status: DC | PRN

## 2015-06-12 MED ORDER — LIDOCAINE HCL (PF) 1 % IJ SOLN
INTRAMUSCULAR | Status: AC
  Filled 2015-06-12: qty 30

## 2015-06-12 MED ORDER — HYDRALAZINE HCL 20 MG/ML IJ SOLN
10.0000 mg | INTRAMUSCULAR | Status: DC | PRN
  Administered 2015-06-12: 10 mg via INTRAVENOUS

## 2015-06-12 MED ORDER — HEPARIN (PORCINE) IN NACL 2-0.9 UNIT/ML-% IJ SOLN
INTRAMUSCULAR | Status: AC
  Filled 2015-06-12: qty 1000

## 2015-06-12 MED ORDER — NITROGLYCERIN 0.4 MG/SPRAY TL SOLN
1.0000 | Freq: Once | Status: AC
  Administered 2015-06-12: 1 via SUBLINGUAL

## 2015-06-12 MED ORDER — HEPARIN SODIUM (PORCINE) 1000 UNIT/ML IJ SOLN
INTRAMUSCULAR | Status: DC | PRN
  Administered 2015-06-12: 8000 [IU] via INTRAVENOUS
  Administered 2015-06-12 (×2): 3000 [IU] via INTRAVENOUS

## 2015-06-12 MED ORDER — NITROGLYCERIN 0.4 MG/SPRAY TL SOLN
Status: AC
  Filled 2015-06-12: qty 4.9

## 2015-06-12 MED ORDER — METFORMIN HCL 500 MG PO TABS: 500.0000 mg | ORAL_TABLET | Freq: Two times a day (BID) | ORAL | Status: DC

## 2015-06-12 MED ORDER — HEPARIN SODIUM (PORCINE) 1000 UNIT/ML IJ SOLN
INTRAMUSCULAR | Status: AC
  Filled 2015-06-12: qty 1

## 2015-06-12 MED ORDER — MIDAZOLAM HCL 2 MG/2ML IJ SOLN
INTRAMUSCULAR | Status: DC | PRN
  Administered 2015-06-12: 1 mg via INTRAVENOUS
  Administered 2015-06-12: 2 mg via INTRAVENOUS

## 2015-06-12 MED ORDER — VERAPAMIL HCL 2.5 MG/ML IV SOLN
INTRAVENOUS | Status: AC
  Filled 2015-06-12: qty 2

## 2015-06-12 SURGICAL SUPPLY — 23 items
BALLN COYOTE OTW 2.5X220X150 (BALLOONS) ×3
BALLN COYOTE OTW 3X100X150 (BALLOONS) ×3
BALLOON COYOTE OTW 2.5X220X150 (BALLOONS) ×2 IMPLANT
BALLOON COYOTE OTW 3X100X150 (BALLOONS) ×2 IMPLANT
CATH CROSS OVER TEMPO 5F (CATHETERS) ×3 IMPLANT
CATH CXI SUPP ANG 2.6FR 150CM (MICROCATHETER) ×3 IMPLANT
CROWN STEALTH MICRO-30 1.25MM (CATHETERS) ×3 IMPLANT
KIT ENCORE 26 ADVANTAGE (KITS) ×3 IMPLANT
KIT MICROINTRODUCER STIFF 5F (SHEATH) ×3 IMPLANT
KIT PV (KITS) ×3 IMPLANT
LUBRICANT VIPERSLIDE CORONARY (MISCELLANEOUS) ×3 IMPLANT
SHEATH HIGHFLEX ANSEL 7FR 55CM (SHEATH) ×3 IMPLANT
SHEATH PINNACLE 5F 10CM (SHEATH) ×3 IMPLANT
SHEATH PINNACLE 7F 10CM (SHEATH) ×3 IMPLANT
SHEATH PINNACLE 8F 10CM (SHEATH) ×3 IMPLANT
SYR MEDRAD MARK V 150ML (SYRINGE) ×3 IMPLANT
TRANSDUCER W/STOPCOCK (MISCELLANEOUS) ×3 IMPLANT
TRAY PV CATH (CUSTOM PROCEDURE TRAY) ×3 IMPLANT
TUBING CIL FLEX 10 FLL-RA (TUBING) ×3 IMPLANT
WIRE BENTSON .035X145CM (WIRE) ×3 IMPLANT
WIRE HITORQ VERSACORE ST 145CM (WIRE) ×3 IMPLANT
WIRE SPARTACORE .014X300CM (WIRE) ×6 IMPLANT
WIRE VIPER ADVANCE .017X335CM (WIRE) ×3 IMPLANT

## 2015-06-12 NOTE — Progress Notes (Signed)
Site area: right groin Site Prior to Removal:  Level 0 Pressure Applied For: 35 minutes Manual:   yes Patient Status During Pull:  stable Post Pull Site:  Level 0 Post Pull Instructions Given:  yes Post Pull Pulses Present: right doppler/ left palpable Dressing Applied: occlusive  Bedrest begins @ 1220 Comments:resting comfortably

## 2015-06-12 NOTE — Discharge Instructions (Signed)
Angiogram, Care After °Refer to this sheet in the next few weeks. These instructions provide you with information about caring for yourself after your procedure. Your health care provider may also give you more specific instructions. Your treatment has been planned according to current medical practices, but problems sometimes occur. Call your health care provider if you have any problems or questions after your procedure. °WHAT TO EXPECT AFTER THE PROCEDURE °After your procedure, it is typical to have the following: °· Bruising at the catheter insertion site that usually fades within 1-2 weeks. °· Blood collecting in the tissue (hematoma) that may be painful to the touch. It should usually decrease in size and tenderness within 1-2 weeks. °HOME CARE INSTRUCTIONS °· Take medicines only as directed by your health care provider. °· You may shower 24-48 hours after the procedure or as directed by your health care provider. Remove the bandage (dressing) and gently wash the site with plain soap and water. Pat the area dry with a clean towel. Do not rub the site, because this may cause bleeding. °· Do not take baths, swim, or use a hot tub until your health care provider approves. °· Check your insertion site every day for redness, swelling, or drainage. °· Do not apply powder or lotion to the site. °· Do not lift over 10 lb (4.5 kg) for 5 days after your procedure or as directed by your health care provider. °· Ask your health care provider when it is okay to: °¨ Return to work or school. °¨ Resume usual physical activities or sports. °¨ Resume sexual activity. °· Do not drive home if you are discharged the same day as the procedure. Have someone else drive you. °· You may drive 24 hours after the procedure unless otherwise instructed by your health care provider. °· Do not operate machinery or power tools for 24 hours after the procedure or as directed by your health care provider. °· If your procedure was done as an  outpatient procedure, which means that you went home the same day as your procedure, a responsible adult should be with you for the first 24 hours after you arrive home. °· Keep all follow-up visits as directed by your health care provider. This is important. °SEEK MEDICAL CARE IF: °· You have a fever. °· You have chills. °· You have increased bleeding from the catheter insertion site. Hold pressure on the site. °SEEK IMMEDIATE MEDICAL CARE IF: °· You have unusual pain at the catheter insertion site. °· You have redness, warmth, or swelling at the catheter insertion site. °· You have drainage (other than a small amount of blood on the dressing) from the catheter insertion site. °· The catheter insertion site is bleeding, and the bleeding does not stop after 30 minutes of holding steady pressure on the site. °· The area near or just beyond the catheter insertion site becomes pale, cool, tingly, or numb. °  °This information is not intended to replace advice given to you by your health care provider. Make sure you discuss any questions you have with your health care provider. °  °Document Released: 02/20/2005 Document Revised: 08/25/2014 Document Reviewed: 01/05/2013 °Elsevier Interactive Patient Education ©2016 [employer] ° °

## 2015-06-12 NOTE — Interval H&P Note (Signed)
History and Physical Interval Note:  06/12/2015 6:28 AM  [name]  has presented today for surgery, with the diagnosis of pad  The various methods of treatment have been discussed with the patient and family. After consideration of risks, benefits and other options for treatment, the patient has consented to  Procedure(s): Lower Extremity Angiography (N/A) and possible angioplasty as a surgical intervention .  The patient's history has been reviewed, patient examined, no change in status, stable for surgery.  I have reviewed the patient's chart and labs.  Questions were answered to the patient's satisfaction.     [name]

## 2015-06-13 ENCOUNTER — Encounter (HOSPITAL_COMMUNITY): Payer: Self-pay | Admitting: Cardiology

## 2015-06-20 DIAGNOSIS — I10 Essential (primary) hypertension: Secondary | ICD-10-CM | POA: Diagnosis not present

## 2015-06-20 DIAGNOSIS — E7800 Pure hypercholesterolemia, unspecified: Secondary | ICD-10-CM | POA: Diagnosis not present

## 2015-06-20 DIAGNOSIS — I739 Peripheral vascular disease, unspecified: Secondary | ICD-10-CM | POA: Diagnosis not present

## 2015-06-20 DIAGNOSIS — I2510 Atherosclerotic heart disease of native coronary artery without angina pectoris: Secondary | ICD-10-CM | POA: Diagnosis not present

## 2015-07-08 DIAGNOSIS — R195 Other fecal abnormalities: Secondary | ICD-10-CM | POA: Diagnosis not present

## 2015-09-25 DIAGNOSIS — I2510 Atherosclerotic heart disease of native coronary artery without angina pectoris: Secondary | ICD-10-CM | POA: Diagnosis not present

## 2015-09-25 DIAGNOSIS — R195 Other fecal abnormalities: Secondary | ICD-10-CM | POA: Diagnosis not present

## 2015-09-25 DIAGNOSIS — E7800 Pure hypercholesterolemia, unspecified: Secondary | ICD-10-CM | POA: Diagnosis not present

## 2015-09-26 DIAGNOSIS — E7800 Pure hypercholesterolemia, unspecified: Secondary | ICD-10-CM | POA: Diagnosis not present

## 2015-09-26 DIAGNOSIS — I10 Essential (primary) hypertension: Secondary | ICD-10-CM | POA: Diagnosis not present

## 2015-09-26 DIAGNOSIS — I2510 Atherosclerotic heart disease of native coronary artery without angina pectoris: Secondary | ICD-10-CM | POA: Diagnosis not present

## 2015-09-26 DIAGNOSIS — I739 Peripheral vascular disease, unspecified: Secondary | ICD-10-CM | POA: Diagnosis not present

## 2015-10-24 ENCOUNTER — Telehealth: Payer: Self-pay

## 2015-10-24 NOTE — Telephone Encounter (Signed)
Pt would like to be referred to a Gertie Fey because he is having stomach issues. Please call [phone]

## 2015-10-25 NOTE — Telephone Encounter (Signed)
Advised daughter to bring him in to be evaluated first.

## 2015-10-25 NOTE — Telephone Encounter (Signed)
Left message for pt to call back  °

## 2015-10-31 ENCOUNTER — Ambulatory Visit (INDEPENDENT_AMBULATORY_CARE_PROVIDER_SITE_OTHER): Payer: Medicare Other | Admitting: Family Medicine

## 2015-10-31 VITALS — BP 130/72 | HR 94 | Temp 98.4°F | Resp 18 | Wt 195.4 lb

## 2015-10-31 DIAGNOSIS — Z87898 Personal history of other specified conditions: Secondary | ICD-10-CM | POA: Diagnosis not present

## 2015-10-31 DIAGNOSIS — Z8719 Personal history of other diseases of the digestive system: Secondary | ICD-10-CM | POA: Diagnosis not present

## 2015-10-31 DIAGNOSIS — K625 Hemorrhage of anus and rectum: Secondary | ICD-10-CM | POA: Diagnosis not present

## 2015-10-31 DIAGNOSIS — E1151 Type 2 diabetes mellitus with diabetic peripheral angiopathy without gangrene: Secondary | ICD-10-CM | POA: Diagnosis not present

## 2015-10-31 DIAGNOSIS — Z862 Personal history of diseases of the blood and blood-forming organs and certain disorders involving the immune mechanism: Secondary | ICD-10-CM | POA: Diagnosis not present

## 2015-10-31 DIAGNOSIS — Z8711 Personal history of peptic ulcer disease: Secondary | ICD-10-CM

## 2015-10-31 LAB — COMPREHENSIVE METABOLIC PANEL
ALT: 8 U/L — ABNORMAL LOW (ref 9–46)
AST: 11 U/L (ref 10–35)
Albumin: 4.2 g/dL (ref 3.6–5.1)
Alkaline Phosphatase: 55 U/L (ref 40–115)
BUN: 17 mg/dL (ref 7–25)
CO2: 26 mmol/L (ref 20–31)
Calcium: 9.4 mg/dL (ref 8.6–10.3)
Chloride: 103 mmol/L (ref 98–110)
Creat: 1.35 mg/dL — ABNORMAL HIGH (ref 0.70–1.18)
Glucose, Bld: 262 mg/dL — ABNORMAL HIGH (ref 65–99)
Potassium: 4.5 mmol/L (ref 3.5–5.3)
Sodium: 138 mmol/L (ref 135–146)
Total Bilirubin: 0.2 mg/dL (ref 0.2–1.2)
Total Protein: 7.1 g/dL (ref 6.1–8.1)

## 2015-10-31 LAB — POCT CBC
Granulocyte percent: 67 %G (ref 37–80)
HCT, POC: 25.6 % — AB (ref 43.5–53.7)
Hemoglobin: 8.7 g/dL — AB (ref 14.1–18.1)
Lymph, poc: 1.9 (ref 0.6–3.4)
MCH, POC: 29.3 pg (ref 27–31.2)
MCHC: 33.9 g/dL (ref 31.8–35.4)
MCV: 86.5 fL (ref 80–97)
MID (cbc): 0.5 (ref 0–0.9)
MPV: 6.9 fL (ref 0–99.8)
POC Granulocyte: 4.9 (ref 2–6.9)
POC LYMPH PERCENT: 26.4 %L (ref 10–50)
POC MID %: 6.6 %M (ref 0–12)
Platelet Count, POC: 367 10*3/uL (ref 142–424)
RBC: 2.96 M/uL — AB (ref 4.69–6.13)
RDW, POC: 17.6 %
WBC: 7.3 10*3/uL (ref 4.6–10.2)

## 2015-10-31 LAB — LIPID PANEL
Cholesterol: 151 mg/dL (ref 125–200)
HDL: 41 mg/dL (ref >=40)
LDL Cholesterol: 83 mg/dL (ref <130)
Total CHOL/HDL Ratio: 3.7 Ratio (ref <=5.0)
Triglycerides: 136 mg/dL (ref <150)
VLDL: 27 mg/dL (ref <30)

## 2015-10-31 LAB — POC HEMOCCULT BLD/STL (OFFICE/1-CARD/DIAGNOSTIC): Fecal Occult Blood, POC: NEGATIVE

## 2015-10-31 LAB — GLUCOSE, POCT (MANUAL RESULT ENTRY): POC Glucose: 291 mg/dl — AB (ref 70–99)

## 2015-10-31 LAB — FERRITIN: Ferritin: 9 ng/mL — ABNORMAL LOW (ref 20–380)

## 2015-10-31 LAB — POCT GLYCOSYLATED HEMOGLOBIN (HGB A1C): Hemoglobin A1C: 9.4

## 2015-10-31 MED ORDER — ESOMEPRAZOLE MAGNESIUM 40 MG PO PACK: 40.0000 mg | PACK | Freq: Every day | ORAL | Status: DC

## 2015-10-31 MED ORDER — FERROUS SULFATE 325 (65 FE) MG PO TABS: 325.0000 mg | ORAL_TABLET | Freq: Every day | ORAL | Status: DC

## 2015-10-31 MED ORDER — GLIMEPIRIDE 4 MG PO TABS: 4.0000 mg | ORAL_TABLET | Freq: Every day | ORAL | Status: DC

## 2015-10-31 MED ORDER — METFORMIN HCL ER 500 MG PO TB24: 500.0000 mg | ORAL_TABLET | Freq: Every day | ORAL | Status: DC

## 2015-10-31 NOTE — Addendum Note (Signed)
Addended by: Trenell Concannon H on: 10/31/2015 05:47 PM   Modules accepted: Orders

## 2015-10-31 NOTE — Patient Instructions (Addendum)
Increase the Amaryl (glimepiride) to 4 mg daily. Since you have the 2 mg pills you can double up on them, but you next prescription will be for the 4 mg pills  Change the metformin to metformin XL 500 mg twice daily. Hopefully the time release ones will be better tolerated for you.  Be careful with your carbohydrate intake  Get regular exercise  Plan to return in 6 weeks for a recheck. In the meanwhile monitor your glucoses at home.  Return sooner if worse  Referral is being made back to Dr. [name] for your stomach.  Discontinue the omeprazole  Begin taking the Nexium 40 mg 1 daily  In the event of passing more blood please come in promptly  Continue taking your iron, twice daily for now to start rebuilding your blood supply   IF you received an x-ray today, you will receive an invoice from [facility]. Please contact [facility] at [phone] with questions or concerns regarding your invoice.   IF you received labwork today, you will receive an invoice from [employer]. Please contact [facility] at [phone] with questions or concerns regarding your invoice.   Our billing staff will not be able to assist you with questions regarding bills from these companies.  You will be contacted with the lab results as soon as they are available. The fastest way to get your results is to activate your My Chart account. Instructions are located on the last page of this paperwork. If you have not heard from [place] regarding the results in 2 weeks, please contact this office.

## 2015-10-31 NOTE — Progress Notes (Signed)
Patient ID: [name], male    DOB: November 27, 1945  Age: 70 y.o. MRN: [id number]  Chief Complaint  Patient presents with  . A1C Check    Has been checked since March  . Abdominal Pain    Has a history of stomach ulcers and has noticed blood in stool.     Subjective:   70 year old man with a history of passing some red blood in his stools last week. He says he has done this off and on since he was a child. He has a history of gastric ulcers and of hemorrhoids. He has been several years since Dr. [name] last evaluated him, and I do not have the reports on those scrapings. He has not been having a lot of abdominal pain. When he saw some bright red blood on the surface of the stool. He says there have been times in the past that he has had tarry dark stool. He does take iron regularly, has a history of iron deficiency anemia. He says he takes the iron because he feels a lot better when he takes it. He has a history of peripheral vascular disease and cardiovascular disease which she is followed by Dr. Nadyne Coombes.  He takes his medicines regularly but does not check his sugars often. He does not get a lot of exercise in the winter time. He has no headaches or dizziness or chest pains or shortness of breath. No major GU complaints except for the blood. No GU complaints.  Current allergies, medications, problem list, past/family and social histories reviewed.  Objective:  BP 130/72 mmHg  Pulse 94  Temp(Src) 98.4 F (36.9 C) (Oral)  Resp 18  Wt 195 lb 6.4 oz (88.633 kg)  SpO2 98%  Pleasant gentleman, currently by his daughter. TMs normal. Throat clear. Neck supple without nodes. No carotid bruits. Chest is clear to auscultation. Heart regular without murmur. Abdomen soft without organomegaly, masses, or tenderness. Digital rectal exam reveals prostate gland be average in size with no nodules. He does have a couple of little hemorrhoidal tag type tissues present. Extremities were not examined since he gets  vascular care by the other doctor. He saw an eye doctor about a year ago, and I urged him to see doctors regularly.  Assessment & Plan:   Assessment: 1. Type 2 diabetes mellitus with diabetic peripheral angiopathy without gangrene, without long-term current use of insulin ([place])   2. Rectal bleeding   3. History of stomach ulcers   4. History of hemorrhoids   5. History of iron deficiency anemia       Plan: Check his lab work and then discuss treatment  Orders Placed This Encounter  Procedures  . Ferritin  . Comprehensive metabolic panel  . Lipid panel  . Ambulatory referral to Gastroenterology    Referral Priority:  Routine    Referral Type:  Consultation    Referral Reason:  Specialty Services Required    Referred to Provider:  Carol Ada, MD    Requested Specialty:  Gastroenterology    Number of Visits Requested:  1  . POC Hemoccult Bld/Stl (1-Cd Office Dx)  . POCT glucose (manual entry)  . POCT glycosylated hemoglobin (Hb A1C)  . POCT CBC   Results for orders placed or performed in visit on 10/31/15  POC Hemoccult Bld/Stl (1-Cd Office Dx)  Result Value Ref Range   Card #1 Date 10/31/2015    Fecal Occult Blood, POC Negative Negative  POCT glucose (manual entry)  Result Value Ref Range  POC Glucose 291 (A) 70 - 99 mg/dl  POCT glycosylated hemoglobin (Hb A1C)  Result Value Ref Range   Hemoglobin A1C 9.4   POCT CBC  Result Value Ref Range   WBC 7.3 4.6 - 10.2 K/uL   Lymph, poc 1.9 0.6 - 3.4   POC LYMPH PERCENT 26.4 10 - 50 %L   MID (cbc) 0.5 0 - 0.9   POC MID % 6.6 0 - 12 %M   POC Granulocyte 4.9 2 - 6.9   Granulocyte percent 67.0 37 - 80 %G   RBC 2.96 (A) 4.69 - 6.13 M/uL   Hemoglobin 8.7 (A) 14.1 - 18.1 g/dL   HCT, POC 25.6 (A) 43.5 - 53.7 %   MCV 86.5 80 - 97 fL   MCH, POC 29.3 27 - 31.2 pg   MCHC 33.9 31.8 - 35.4 g/dL   RDW, POC 17.6 %   Platelet Count, POC 367 142 - 424 K/uL   MPV 6.9 0 - 99.8 fL     Meds ordered this encounter  Medications  .  ferrous sulfate 325 (65 FE) MG tablet    Sig: Take 1 tablet (325 mg total) by mouth daily with breakfast.    Dispense:  30 tablet    Refill:  11  . metFORMIN (GLUCOPHAGE-XR) 500 MG 24 hr tablet    Sig: Take 1 tablet (500 mg total) by mouth daily with breakfast.    Dispense:  180 tablet    Refill:  1  . glimepiride (AMARYL) 4 MG tablet    Sig: Take 1 tablet (4 mg total) by mouth daily before breakfast.    Dispense:  30 tablet    Refill:  3         Patient Instructions  Increase the Amaryl (glimepiride) to 4 mg daily. Since you have the 2 mg pills you can double up on them, but you next prescription will be for the 4 mg pills  Change the metformin to metformin XL 500 mg twice daily. Hopefully the time release ones will be better tolerated for you.  Be careful with your carbohydrate intake  Get regular exercise  Plan to return in 6 weeks for a recheck. In the meanwhile monitor your glucoses at home.  Return sooner if worse  Referral is being made back to Dr. [name] for your stomach.  Discontinue the omeprazole  Begin taking the Nexium 40 mg 1 daily  In the event of passing more blood please come in promptly  Continue taking your iron, twice daily for now to start rebuilding your blood supply   IF you received an x-ray today, you will receive an invoice from [facility]. Please contact [facility] at [phone] with questions or concerns regarding your invoice.   IF you received labwork today, you will receive an invoice from [employer]. Please contact [facility] at [phone] with questions or concerns regarding your invoice.   Our billing staff will not be able to assist you with questions regarding bills from these companies.  You will be contacted with the lab results as soon as they are available. The fastest way to get your results is to activate your My Chart account. Instructions are located on the last page of this  paperwork. If you have not heard from [place] regarding the results in 2 weeks, please contact this office.       No Follow-up on file.   Kaiden Dardis, MD 10/31/2015

## 2015-11-14 ENCOUNTER — Other Ambulatory Visit: Payer: Self-pay | Admitting: Gastroenterology

## 2015-11-14 DIAGNOSIS — D500 Iron deficiency anemia secondary to blood loss (chronic): Secondary | ICD-10-CM | POA: Diagnosis not present

## 2015-11-14 DIAGNOSIS — I739 Peripheral vascular disease, unspecified: Secondary | ICD-10-CM | POA: Diagnosis not present

## 2015-11-14 DIAGNOSIS — I2510 Atherosclerotic heart disease of native coronary artery without angina pectoris: Secondary | ICD-10-CM | POA: Diagnosis not present

## 2015-11-16 ENCOUNTER — Encounter (HOSPITAL_COMMUNITY): Payer: Self-pay | Admitting: *Deleted

## 2015-11-23 ENCOUNTER — Ambulatory Visit (HOSPITAL_COMMUNITY)
Admission: RE | Admit: 2015-11-23 | Discharge: 2015-11-23 | Disposition: A | Discharge status: Home / Self Care | Payer: Medicare Other | Source: Ambulatory Visit | Attending: Gastroenterology | Admitting: Gastroenterology

## 2015-11-23 ENCOUNTER — Encounter (HOSPITAL_COMMUNITY)
Admission: RE | Disposition: A | Discharge status: Home / Self Care | Payer: Self-pay | Source: Ambulatory Visit | Attending: Gastroenterology

## 2015-11-23 ENCOUNTER — Ambulatory Visit (HOSPITAL_COMMUNITY): Payer: Medicare Other | Admitting: Anesthesiology

## 2015-11-23 ENCOUNTER — Encounter (HOSPITAL_COMMUNITY): Payer: Self-pay | Admitting: Anesthesiology

## 2015-11-23 DIAGNOSIS — Z79899 Other long term (current) drug therapy: Secondary | ICD-10-CM | POA: Diagnosis not present

## 2015-11-23 DIAGNOSIS — R195 Other fecal abnormalities: Secondary | ICD-10-CM | POA: Diagnosis not present

## 2015-11-23 DIAGNOSIS — D500 Iron deficiency anemia secondary to blood loss (chronic): Secondary | ICD-10-CM | POA: Diagnosis not present

## 2015-11-23 DIAGNOSIS — D509 Iron deficiency anemia, unspecified: Secondary | ICD-10-CM | POA: Insufficient documentation

## 2015-11-23 DIAGNOSIS — Z7984 Long term (current) use of oral hypoglycemic drugs: Secondary | ICD-10-CM | POA: Diagnosis not present

## 2015-11-23 DIAGNOSIS — D123 Benign neoplasm of transverse colon: Secondary | ICD-10-CM | POA: Insufficient documentation

## 2015-11-23 DIAGNOSIS — Z6830 Body mass index (BMI) 30.0-30.9, adult: Secondary | ICD-10-CM | POA: Insufficient documentation

## 2015-11-23 DIAGNOSIS — E669 Obesity, unspecified: Secondary | ICD-10-CM | POA: Diagnosis not present

## 2015-11-23 DIAGNOSIS — Z8711 Personal history of peptic ulcer disease: Secondary | ICD-10-CM | POA: Insufficient documentation

## 2015-11-23 DIAGNOSIS — Z87891 Personal history of nicotine dependence: Secondary | ICD-10-CM | POA: Insufficient documentation

## 2015-11-23 DIAGNOSIS — I2510 Atherosclerotic heart disease of native coronary artery without angina pectoris: Secondary | ICD-10-CM | POA: Insufficient documentation

## 2015-11-23 DIAGNOSIS — E1151 Type 2 diabetes mellitus with diabetic peripheral angiopathy without gangrene: Secondary | ICD-10-CM | POA: Diagnosis not present

## 2015-11-23 DIAGNOSIS — K2980 Duodenitis without bleeding: Secondary | ICD-10-CM | POA: Insufficient documentation

## 2015-11-23 DIAGNOSIS — I10 Essential (primary) hypertension: Secondary | ICD-10-CM | POA: Insufficient documentation

## 2015-11-23 DIAGNOSIS — K5730 Diverticulosis of large intestine without perforation or abscess without bleeding: Secondary | ICD-10-CM | POA: Insufficient documentation

## 2015-11-23 DIAGNOSIS — D649 Anemia, unspecified: Secondary | ICD-10-CM | POA: Diagnosis not present

## 2015-11-23 DIAGNOSIS — K644 Residual hemorrhoidal skin tags: Secondary | ICD-10-CM | POA: Insufficient documentation

## 2015-11-23 HISTORY — PX: COLONOSCOPY WITH PROPOFOL: SHX5780

## 2015-11-23 HISTORY — PX: ESOPHAGOGASTRODUODENOSCOPY (EGD) WITH PROPOFOL: SHX5813

## 2015-11-23 LAB — GLUCOSE, CAPILLARY: Glucose-Capillary: 170 mg/dL — ABNORMAL HIGH (ref 65–99)

## 2015-11-23 SURGERY — ESOPHAGOGASTRODUODENOSCOPY (EGD) WITH PROPOFOL
Anesthesia: Monitor Anesthesia Care

## 2015-11-23 MED ORDER — GLYCOPYRROLATE 0.2 MG/ML IJ SOLN
INTRAMUSCULAR | Status: DC | PRN
  Administered 2015-11-23: 0.2 mg via INTRAVENOUS

## 2015-11-23 MED ORDER — FENTANYL CITRATE (PF) 100 MCG/2ML IJ SOLN: 25.0000 ug | INTRAMUSCULAR | Status: DC | PRN

## 2015-11-23 MED ORDER — SODIUM CHLORIDE 0.9 % IV SOLN: INTRAVENOUS | Status: DC

## 2015-11-23 MED ORDER — ONDANSETRON HCL 4 MG/2ML IJ SOLN: 4.0000 mg | Freq: Once | INTRAMUSCULAR | Status: DC | PRN

## 2015-11-23 MED ORDER — LABETALOL HCL 5 MG/ML IV SOLN
INTRAVENOUS | Status: DC | PRN
  Administered 2015-11-23: 5 mg via INTRAVENOUS

## 2015-11-23 MED ORDER — LACTATED RINGERS IV SOLN
INTRAVENOUS | Status: DC
  Administered 2015-11-23: 1000 mL via INTRAVENOUS

## 2015-11-23 MED ORDER — PROPOFOL 10 MG/ML IV BOLUS
INTRAVENOUS | Status: AC
  Filled 2015-11-23: qty 20

## 2015-11-23 MED ORDER — PROPOFOL 10 MG/ML IV BOLUS
INTRAVENOUS | Status: DC | PRN
  Administered 2015-11-23 (×4): 50 mg via INTRAVENOUS
  Administered 2015-11-23: 100 mg via INTRAVENOUS

## 2015-11-23 MED ORDER — GLYCOPYRROLATE 0.2 MG/ML IJ SOLN
INTRAMUSCULAR | Status: AC
  Filled 2015-11-23: qty 1

## 2015-11-23 MED ORDER — LABETALOL HCL 5 MG/ML IV SOLN
INTRAVENOUS | Status: AC
  Filled 2015-11-23: qty 4

## 2015-11-23 MED ORDER — LACTATED RINGERS IV SOLN
INTRAVENOUS | Status: DC | PRN
  Administered 2015-11-23: 13:00:00 via INTRAVENOUS

## 2015-11-23 SURGICAL SUPPLY — 24 items

## 2015-11-23 NOTE — Interval H&P Note (Signed)
History and Physical Interval Note:  11/23/2015 8:03 AM  [name]  has presented today for surgery, with the diagnosis of IDA  The various methods of treatment have been discussed with the patient and family. After consideration of risks, benefits and other options for treatment, the patient has consented to  Procedure(s): ESOPHAGOGASTRODUODENOSCOPY (EGD) WITH PROPOFOL (N/A) COLONOSCOPY WITH PROPOFOL (N/A) as a surgical intervention .  The patient's history has been reviewed, patient examined, no change in status, stable for surgery.  I have reviewed the patient's chart and labs.  Questions were answered to the patient's satisfaction.    On 12/16/2011 the patient under an EGD/Colonoscopy for IDA. At that time his HGB was at 11.1 g/dL, but currently he has a drop in his HGB down to 8.7 g/dL. Two months ago he reported a black stool, but he does not know if it was from his iron supplementation. Intermittently he will have hematochezia. Over the years he has had other EGDs/Colonoscopies for heme positive stool and IDA. During the last evaluation on 04/2012, his HGB did increase up to 14.9 g/dL on a PPI as he was noted to have a mild duodenitis. He started on Effient for cardiac stents and stents for PVD late 2016. After the stenting he feels better.  I reviewed his blood work and last year on 10/17/2014 his HGB was at 14.7 g/dL. Currently it is at 8.7 g/dL. I will repeat the EGD/Colonoscopy while he is on Effient. This can potentially improve diagnostic accuracy, but I may not necessarily resolve the bleeding. I reviewed his cardiac work up and it turns out that the interventions performed by Dr. [name] was of the PVD and not cardiac intervention. Of note, the capsule endoscopy was not performed as he had a resolution of his anemia with the PPI.Marland Kitchen  Reagan Behlke D

## 2015-11-23 NOTE — Op Note (Signed)
[facility] Patient Name: [name] Procedure Date: 11/23/2015 MRN: [id number] Attending MD: Carol Ada , MD Date of Birth: October 31, 1945 CSN:  Age: 70 Admit Type: Inpatient Procedure:                Colonoscopy Indications:              Heme positive stool, Iron deficiency anemia Providers:                Carol Ada, MD, Laverta Baltimore, RN, William Dalton, Technician Referring MD:              Medicines:                Propofol per Anesthesia Complications:            No immediate complications. Estimated Blood Loss:     Estimated blood loss was minimal. Procedure:                Pre-Anesthesia Assessment:                           - Prior to the procedure, a History and Physical                            was performed, and patient medications and                            allergies were reviewed. The patient's tolerance of                            previous anesthesia was also reviewed. The risks                            and benefits of the procedure and the sedation                            options and risks were discussed with the patient.                            All questions were answered, and informed consent                            was obtained. Prior Anticoagulants: The patient has                            taken Eliquis (apixaban). ASA Grade Assessment: III                            - A patient with severe systemic disease. After                            reviewing the risks and benefits, the patient was  deemed in satisfactory condition to undergo the                            procedure.                           - Sedation was administered by an anesthesia                            professional. Deep sedation was attained.                           After obtaining informed consent, the colonoscope                            was passed under direct vision. Throughout the                       procedure, the patient's blood pressure, pulse, and                            oxygen saturations were monitored continuously. The                            [id number] scope was introduced through                            the anus and advanced to the the cecum, identified                            by appendiceal orifice and ileocecal valve. The                            colonoscopy was performed without difficulty. The                            patient tolerated the procedure well. The quality                            of the bowel preparation was excellent. The                            ileocecal valve, appendiceal orifice, and rectum                            were photographed. Scope In: 1:15:30 PM Scope Out: 1:40:01 PM Scope Withdrawal Time: 0 hours 15 minutes 22 seconds  Total Procedure Duration: 0 hours 24 minutes 31 seconds  Findings:      Two sessile polyps were found in the transverse colon. The polyps were 2       to 3 mm in size. These polyps were removed with a cold snare. Resection       and retrieval were complete.      A few medium-mouthed diverticula were found in the transverse colon and       ascending colon. Impression:               -  Two 2 to 3 mm polyps in the transverse colon,                            removed with a cold snare. Resected and retrieved.                           - Diverticulosis in the transverse colon and in the                            ascending colon. Moderate Sedation:      None Recommendation:           - Patient has a contact number available for                            emergencies. The signs and symptoms of potential                            delayed complications were discussed with the                            patient. Return to normal activities tomorrow.                            Written discharge instructions were provided to the                            patient.                            - Resume previous diet.                           - Continue present medications.                           - Await pathology results.                           - Repeat colonoscopy in 5 years for surveillance. Procedure Code(s):        --- Professional ---                           [phone], Colonoscopy, flexible; with removal of                            tumor(s), polyp(s), or other lesion(s) by snare                            technique Diagnosis Code(s):        --- Professional ---                           D12.3, Benign neoplasm of transverse colon (hepatic  flexure or splenic flexure)                           R19.5, Other fecal abnormalities                           D50.9, Iron deficiency anemia, unspecified                           K57.30, Diverticulosis of large intestine without                            perforation or abscess without bleeding CPT copyright 2016 American Medical Association. All rights reserved. The codes documented in this report are preliminary and upon coder review may  be revised to meet current compliance requirements. Carol Ada, MD Carol Ada, MD 11/23/2015 2:02:08 PM This report has been signed electronically. Number of Addenda: 0

## 2015-11-23 NOTE — Op Note (Signed)
[facility] Patient Name: [name] Procedure Date: 11/23/2015 MRN: [id number] Attending MD: Carol Ada , MD Date of Birth: 1946/06/17 CSN:  Age: 70 Admit Type: Inpatient Procedure:                Upper GI endoscopy Indications:              Heme positive stool Providers:                Carol Ada, MD, Laverta Baltimore, RN, William Dalton, Technician Referring MD:              Medicines:                Propofol per Anesthesia Complications:            No immediate complications. Estimated Blood Loss:     Estimated blood loss: none. Procedure:                Pre-Anesthesia Assessment:                           - Prior to the procedure, a History and Physical                            was performed, and patient medications and                            allergies were reviewed. The patient's tolerance of                            previous anesthesia was also reviewed. The risks                            and benefits of the procedure and the sedation                            options and risks were discussed with the patient.                            All questions were answered, and informed consent                            was obtained. Prior Anticoagulants: The patient has                            taken Effient. [ASA Grade]. After reviewing the                            risks and benefits, the patient was deemed in                            satisfactory condition to undergo the procedure.                           - Sedation  was administered by an anesthesia                            professional. Deep sedation was attained.                           After obtaining informed consent, the endoscope was                            passed under direct vision. Throughout the                            procedure, the patient's blood pressure, pulse, and                            oxygen saturations were monitored  continuously. The                            [id number]) scope was introduced through the                            mouth, and advanced to the third part of duodenum.                            The upper GI endoscopy was accomplished without                            difficulty. The patient tolerated the procedure                            well. Scope In: Scope Out: Findings:      The esophagus was normal.      The stomach was normal.      Patchy mild inflammation characterized by erythema was found in the       first portion of the duodenum. Impression:               - Normal esophagus.                           - Normal stomach.                           - Duodenitis.                           - No specimens collected. Moderate Sedation:      None Recommendation:           - Patient has a contact number available for                            emergencies. The signs and symptoms of potential                            delayed complications were discussed with the  patient. Return to normal activities tomorrow.                            Written discharge instructions were provided to the                            patient.                           - Resume previous diet.                           - Continue present medications. Procedure Code(s):        --- Professional ---                           [phone], Esophagogastroduodenoscopy, flexible,                            transoral; diagnostic, including collection of                            specimen(s) by brushing or washing, when performed                            (separate procedure) Diagnosis Code(s):        --- Professional ---                           K29.80, Duodenitis without bleeding                           R19.5, Other fecal abnormalities CPT copyright 2016 American Medical Association. All rights reserved. The codes documented in this report are preliminary and upon coder  review may  be revised to meet current compliance requirements. Carol Ada, MD Carol Ada, MD 11/23/2015 2:05:43 PM This report has been signed electronically. Number of Addenda: 0

## 2015-11-23 NOTE — H&P (View-Only) (Signed)
Patient ID: [name], male    DOB: 03/06/1946  Age: 70 y.o. MRN: [id number]  Chief Complaint  Patient presents with  . A1C Check    Has been checked since March  . Abdominal Pain    Has a history of stomach ulcers and has noticed blood in stool.     Subjective:   70 year old man with a history of passing some red blood in his stools last week. He says he has done this off and on since he was a child. He has a history of gastric ulcers and of hemorrhoids. He has been several years since Dr. [name] last evaluated him, and I do not have the reports on those scrapings. He has not been having a lot of abdominal pain. When he saw some bright red blood on the surface of the stool. He says there have been times in the past that he has had tarry dark stool. He does take iron regularly, has a history of iron deficiency anemia. He says he takes the iron because he feels a lot better when he takes it. He has a history of peripheral vascular disease and cardiovascular disease which she is followed by Dr. Nadyne Coombes.  He takes his medicines regularly but does not check his sugars often. He does not get a lot of exercise in the winter time. He has no headaches or dizziness or chest pains or shortness of breath. No major GU complaints except for the blood. No GU complaints.  Current allergies, medications, problem list, past/family and social histories reviewed.  Objective:  BP 130/72 mmHg  Pulse 94  Temp(Src) 98.4 F (36.9 C) (Oral)  Resp 18  Wt 195 lb 6.4 oz (88.633 kg)  SpO2 98%  Pleasant gentleman, currently by his daughter. TMs normal. Throat clear. Neck supple without nodes. No carotid bruits. Chest is clear to auscultation. Heart regular without murmur. Abdomen soft without organomegaly, masses, or tenderness. Digital rectal exam reveals prostate gland be average in size with no nodules. He does have a couple of little hemorrhoidal tag type tissues present. Extremities were not examined since he gets  vascular care by the other doctor. He saw an eye doctor about a year ago, and I urged him to see doctors regularly.  Assessment & Plan:   Assessment: 1. Type 2 diabetes mellitus with diabetic peripheral angiopathy without gangrene, without long-term current use of insulin ([place])   2. Rectal bleeding   3. History of stomach ulcers   4. History of hemorrhoids   5. History of iron deficiency anemia       Plan: Check his lab work and then discuss treatment  Orders Placed This Encounter  Procedures  . Ferritin  . Comprehensive metabolic panel  . Lipid panel  . Ambulatory referral to Gastroenterology    Referral Priority:  Routine    Referral Type:  Consultation    Referral Reason:  Specialty Services Required    Referred to Provider:  Carol Ada, MD    Requested Specialty:  Gastroenterology    Number of Visits Requested:  1  . POC Hemoccult Bld/Stl (1-Cd Office Dx)  . POCT glucose (manual entry)  . POCT glycosylated hemoglobin (Hb A1C)  . POCT CBC   Results for orders placed or performed in visit on 10/31/15  POC Hemoccult Bld/Stl (1-Cd Office Dx)  Result Value Ref Range   Card #1 Date 10/31/2015    Fecal Occult Blood, POC Negative Negative  POCT glucose (manual entry)  Result Value Ref Range  POC Glucose 291 (A) 70 - 99 mg/dl  POCT glycosylated hemoglobin (Hb A1C)  Result Value Ref Range   Hemoglobin A1C 9.4   POCT CBC  Result Value Ref Range   WBC 7.3 4.6 - 10.2 K/uL   Lymph, poc 1.9 0.6 - 3.4   POC LYMPH PERCENT 26.4 10 - 50 %L   MID (cbc) 0.5 0 - 0.9   POC MID % 6.6 0 - 12 %M   POC Granulocyte 4.9 2 - 6.9   Granulocyte percent 67.0 37 - 80 %G   RBC 2.96 (A) 4.69 - 6.13 M/uL   Hemoglobin 8.7 (A) 14.1 - 18.1 g/dL   HCT, POC 25.6 (A) 43.5 - 53.7 %   MCV 86.5 80 - 97 fL   MCH, POC 29.3 27 - 31.2 pg   MCHC 33.9 31.8 - 35.4 g/dL   RDW, POC 17.6 %   Platelet Count, POC 367 142 - 424 K/uL   MPV 6.9 0 - 99.8 fL     Meds ordered this encounter  Medications  .  ferrous sulfate 325 (65 FE) MG tablet    Sig: Take 1 tablet (325 mg total) by mouth daily with breakfast.    Dispense:  30 tablet    Refill:  11  . metFORMIN (GLUCOPHAGE-XR) 500 MG 24 hr tablet    Sig: Take 1 tablet (500 mg total) by mouth daily with breakfast.    Dispense:  180 tablet    Refill:  1  . glimepiride (AMARYL) 4 MG tablet    Sig: Take 1 tablet (4 mg total) by mouth daily before breakfast.    Dispense:  30 tablet    Refill:  3         Patient Instructions  Increase the Amaryl (glimepiride) to 4 mg daily. Since you have the 2 mg pills you can double up on them, but you next prescription will be for the 4 mg pills  Change the metformin to metformin XL 500 mg twice daily. Hopefully the time release ones will be better tolerated for you.  Be careful with your carbohydrate intake  Get regular exercise  Plan to return in 6 weeks for a recheck. In the meanwhile monitor your glucoses at home.  Return sooner if worse  Referral is being made back to Dr. [name] for your stomach.  Discontinue the omeprazole  Begin taking the Nexium 40 mg 1 daily  In the event of passing more blood please come in promptly  Continue taking your iron, twice daily for now to start rebuilding your blood supply   IF you received an x-ray today, you will receive an invoice from [facility]. Please contact [facility] at [phone] with questions or concerns regarding your invoice.   IF you received labwork today, you will receive an invoice from [employer]. Please contact [facility] at [phone] with questions or concerns regarding your invoice.   Our billing staff will not be able to assist you with questions regarding bills from these companies.  You will be contacted with the lab results as soon as they are available. The fastest way to get your results is to activate your My Chart account. Instructions are located on the last page of this  paperwork. If you have not heard from [place] regarding the results in 2 weeks, please contact this office.       No Follow-up on file.   Rucker Pridgeon, MD 10/31/2015

## 2015-11-23 NOTE — Anesthesia Postprocedure Evaluation (Signed)
Anesthesia Post Note  Patient: Koleson Ata  Procedure(s) Performed: Procedure(s) (LRB): ESOPHAGOGASTRODUODENOSCOPY (EGD) WITH PROPOFOL (N/A) COLONOSCOPY WITH PROPOFOL (N/A)  Patient location during evaluation: PACU Anesthesia Type: MAC Level of consciousness: awake and alert and oriented Pain management: pain level controlled Vital Signs Assessment: post-procedure vital signs reviewed and stable Respiratory status: spontaneous breathing, nonlabored ventilation and respiratory function stable Cardiovascular status: blood pressure returned to baseline and stable Postop Assessment: no signs of nausea or vomiting Anesthetic complications: no    Last Vitals:  Filed Vitals:   11/23/15 1217 11/23/15 1350  BP: 174/82 130/73  Pulse: 74 78  Temp: 37.1 C 36.4 C  Resp: 22 23    Last Pain: There were no vitals filed for this visit.               Nikiah Goin A.

## 2015-11-23 NOTE — Discharge Instructions (Signed)

## 2015-11-23 NOTE — Transfer of Care (Signed)
Immediate Anesthesia Transfer of Care Note  Patient: [name]  Procedure(s) Performed: Procedure(s): ESOPHAGOGASTRODUODENOSCOPY (EGD) WITH PROPOFOL (N/A) COLONOSCOPY WITH PROPOFOL (N/A)  Patient Location: PACU  Anesthesia Type:MAC  Level of Consciousness:  sedated, patient cooperative and responds to stimulation  Airway & Oxygen Therapy:Patient Spontanous Breathing and Patient connected to face mask oxgen  Post-op Assessment:  Report given to PACU RN and Post -op Vital signs reviewed and stable  Post vital signs:  Reviewed and stable  Last Vitals:  Filed Vitals:   11/23/15 1217  BP: 174/82  Pulse: 74  Temp: 37.1 C  Resp: 22    Complications: No apparent anesthesia complications

## 2015-11-23 NOTE — Anesthesia Preprocedure Evaluation (Addendum)
Anesthesia Evaluation  Patient identified by MRN, date of birth, ID band Patient awake    Reviewed: Allergy & Precautions, NPO status , Patient's Chart, lab work & pertinent test results, reviewed documented beta blocker date and time   Airway Mallampati: III  TM Distance: >3 FB Neck ROM: Full    Dental  (+) Poor Dentition, Missing, Chipped   Pulmonary former smoker,    Pulmonary exam normal breath sounds clear to auscultation       Cardiovascular hypertension, Pt. on medications and Pt. on home beta blockers + CAD and + Peripheral Vascular Disease  Normal cardiovascular exam Rhythm:Regular Rate:Normal     Neuro/Psych negative neurological ROS  negative psych ROS   GI/Hepatic Neg liver ROS, PUD, GERD  Medicated and Controlled,Hx/o GI bleeding   Endo/Other  diabetes, Well Controlled, Type 2, Oral Hypoglycemic AgentsObesity  Renal/GU negative Renal ROS  negative genitourinary   Musculoskeletal negative musculoskeletal ROS (+)   Abdominal (+) + obese,   Peds  Hematology  (+) anemia ,   Anesthesia Other Findings   Reproductive/Obstetrics                           Anesthesia Physical Anesthesia Plan  ASA: III  Anesthesia Plan: MAC   Post-op Pain Management:    Induction: Intravenous  Airway Management Planned: Natural Airway and Nasal Cannula  Additional Equipment:   Intra-op Plan:   Post-operative Plan:   Informed Consent: I have reviewed the patients History and Physical, chart, labs and discussed the procedure including the risks, benefits and alternatives for the proposed anesthesia with the patient or authorized representative who has indicated his/her understanding and acceptance.     Plan Discussed with: CRNA, Anesthesiologist and Surgeon  Anesthesia Plan Comments:         Anesthesia Quick Evaluation

## 2015-11-27 ENCOUNTER — Encounter (HOSPITAL_COMMUNITY): Payer: Self-pay | Admitting: Gastroenterology

## 2016-01-23 DIAGNOSIS — I2510 Atherosclerotic heart disease of native coronary artery without angina pectoris: Secondary | ICD-10-CM | POA: Diagnosis not present

## 2016-01-23 DIAGNOSIS — I10 Essential (primary) hypertension: Secondary | ICD-10-CM | POA: Diagnosis not present

## 2016-01-23 DIAGNOSIS — I739 Peripheral vascular disease, unspecified: Secondary | ICD-10-CM | POA: Diagnosis not present

## 2016-01-23 DIAGNOSIS — E7800 Pure hypercholesterolemia, unspecified: Secondary | ICD-10-CM | POA: Diagnosis not present

## 2016-02-16 DIAGNOSIS — K558 Other vascular disorders of intestine: Secondary | ICD-10-CM

## 2016-02-16 DIAGNOSIS — K5520 Angiodysplasia of colon without hemorrhage: Secondary | ICD-10-CM

## 2016-02-16 HISTORY — DX: Angiodysplasia of colon without hemorrhage: K55.20

## 2016-02-16 HISTORY — DX: Other vascular disorders of intestine: K55.8

## 2016-02-18 ENCOUNTER — Ambulatory Visit (INDEPENDENT_AMBULATORY_CARE_PROVIDER_SITE_OTHER): Payer: Medicare Other | Admitting: Internal Medicine

## 2016-02-18 VITALS — BP 124/84 | HR 72 | Temp 98.0°F | Resp 18 | Ht 67.5 in | Wt 198.0 lb

## 2016-02-18 DIAGNOSIS — R05 Cough: Secondary | ICD-10-CM

## 2016-02-18 DIAGNOSIS — S20162A Insect bite (nonvenomous) of breast, left breast, initial encounter: Secondary | ICD-10-CM

## 2016-02-18 DIAGNOSIS — R059 Cough, unspecified: Secondary | ICD-10-CM

## 2016-02-18 DIAGNOSIS — W57XXXA Bitten or stung by nonvenomous insect and other nonvenomous arthropods, initial encounter: Secondary | ICD-10-CM

## 2016-02-18 MED ORDER — HYDROCODONE-HOMATROPINE 5-1.5 MG/5ML PO SYRP: 5.0000 mL | ORAL_SOLUTION | Freq: Four times a day (QID) | ORAL | Status: DC | PRN

## 2016-02-18 MED ORDER — TRIAMCINOLONE ACETONIDE 0.1 % EX CREA: 1.0000 "application " | TOPICAL_CREAM | Freq: Two times a day (BID) | CUTANEOUS | Status: DC

## 2016-02-18 NOTE — Progress Notes (Signed)
   Subjective:    Patient ID: [name], male    DOB: [birth year], 70 y.o.   MRN: [id number]  HPItick found on chest last week--pulled off but area on L nipple has remained sore, sensitive to clothes since. Was red but that has resolved. Now itches. also worried that he has developed 3 d of sore throat with cough No fever-no HA-no other rash. No Joint sxtoms.  Patient Active Problem List   Diagnosis Date Noted  . Chest pain with high risk for cardiac etiology 04/22/2015  . Claudication in peripheral vascular disease ([place]) 04/22/2015  . Anemia, iron deficiency 10/17/2014  . Essential hypertension 10/17/2014  . DM2 (diabetes mellitus, type 2) ([place]) 04/08/2012  . PUD (peptic ulcer disease) 11/11/2011     Review of Systems noncontr    Objective:   Physical Exam BP 124/84 mmHg  Pulse 72  Temp(Src) 98 F (36.7 C) (Oral)  Resp 18  Ht 5' 7.5" (1.715 m)  Wt 198 lb (89.812 kg)  BMI 30.54 kg/m2  SpO2 99% HEENT- conj clear, nares clear, throat sl red w/out exud  No AC nodes Chest clear Ht reg w/out M L nipple with tiny sens scab at 8 oclock margin--no pus--not red, vesicular or pustular.       Assessment & Plan:  URI-vural w/ cough Tick bite resolving normally  Meds ordered this encounter  Medications  . triamcinolone cream (KENALOG) 0.1 %    Sig: Apply 1 application topically 2 (two) times daily.    Dispense:  30 g    Refill:  0  . HYDROcodone-homatropine (HYCODAN) 5-1.5 MG/5ML syrup    Sig: Take 5 mLs by mouth every 6 (six) hours as needed.    Dispense:  120 mL    Refill:  0   F/u if febrile

## 2016-02-18 NOTE — Patient Instructions (Signed)
     IF you received an x-ray today, you will receive an invoice from [facility]. Please contact [facility] at [phone] with questions or concerns regarding your invoice.   IF you received labwork today, you will receive an invoice from [facility]. Please contact [facility] at [phone] with questions or concerns regarding your invoice.   Our billing staff will not be able to assist you with questions regarding bills from these companies.  You will be contacted with the lab results as soon as they are available. The fastest way to get your results is to activate your My Chart account. Instructions are located on the last page of this paperwork. If you have not heard from us regarding the results in 2 weeks, please contact this office.      

## 2016-02-27 ENCOUNTER — Emergency Department (HOSPITAL_COMMUNITY): Payer: Medicare Other

## 2016-02-27 ENCOUNTER — Inpatient Hospital Stay (HOSPITAL_COMMUNITY)
Admission: EM | Admit: 2016-02-27 | Discharge: 2016-03-01 | DRG: 346 | Disposition: A | Discharge status: Home / Self Care | Payer: Medicare Other | Attending: Internal Medicine | Admitting: Internal Medicine

## 2016-02-27 ENCOUNTER — Encounter (HOSPITAL_COMMUNITY): Payer: Self-pay | Admitting: Emergency Medicine

## 2016-02-27 DIAGNOSIS — Z79899 Other long term (current) drug therapy: Secondary | ICD-10-CM

## 2016-02-27 DIAGNOSIS — R0602 Shortness of breath: Secondary | ICD-10-CM | POA: Diagnosis present

## 2016-02-27 DIAGNOSIS — E785 Hyperlipidemia, unspecified: Secondary | ICD-10-CM | POA: Diagnosis present

## 2016-02-27 DIAGNOSIS — E119 Type 2 diabetes mellitus without complications: Secondary | ICD-10-CM

## 2016-02-27 DIAGNOSIS — D500 Iron deficiency anemia secondary to blood loss (chronic): Secondary | ICD-10-CM | POA: Diagnosis present

## 2016-02-27 DIAGNOSIS — I10 Essential (primary) hypertension: Secondary | ICD-10-CM | POA: Diagnosis present

## 2016-02-27 DIAGNOSIS — E663 Overweight: Secondary | ICD-10-CM | POA: Diagnosis present

## 2016-02-27 DIAGNOSIS — Z8249 Family history of ischemic heart disease and other diseases of the circulatory system: Secondary | ICD-10-CM

## 2016-02-27 DIAGNOSIS — E1151 Type 2 diabetes mellitus with diabetic peripheral angiopathy without gangrene: Secondary | ICD-10-CM | POA: Diagnosis present

## 2016-02-27 DIAGNOSIS — Z823 Family history of stroke: Secondary | ICD-10-CM

## 2016-02-27 DIAGNOSIS — Z6830 Body mass index (BMI) 30.0-30.9, adult: Secondary | ICD-10-CM

## 2016-02-27 DIAGNOSIS — K5521 Angiodysplasia of colon with hemorrhage: Principal | ICD-10-CM | POA: Diagnosis present

## 2016-02-27 DIAGNOSIS — K5731 Diverticulosis of large intestine without perforation or abscess with bleeding: Secondary | ICD-10-CM | POA: Diagnosis not present

## 2016-02-27 DIAGNOSIS — K922 Gastrointestinal hemorrhage, unspecified: Secondary | ICD-10-CM

## 2016-02-27 DIAGNOSIS — K219 Gastro-esophageal reflux disease without esophagitis: Secondary | ICD-10-CM | POA: Diagnosis present

## 2016-02-27 DIAGNOSIS — R079 Chest pain, unspecified: Secondary | ICD-10-CM | POA: Diagnosis present

## 2016-02-27 DIAGNOSIS — K2970 Gastritis, unspecified, without bleeding: Secondary | ICD-10-CM | POA: Diagnosis present

## 2016-02-27 DIAGNOSIS — Z8711 Personal history of peptic ulcer disease: Secondary | ICD-10-CM

## 2016-02-27 DIAGNOSIS — Z87891 Personal history of nicotine dependence: Secondary | ICD-10-CM

## 2016-02-27 DIAGNOSIS — R110 Nausea: Secondary | ICD-10-CM | POA: Diagnosis not present

## 2016-02-27 DIAGNOSIS — Z7982 Long term (current) use of aspirin: Secondary | ICD-10-CM

## 2016-02-27 DIAGNOSIS — D649 Anemia, unspecified: Secondary | ICD-10-CM | POA: Diagnosis not present

## 2016-02-27 DIAGNOSIS — Z955 Presence of coronary angioplasty implant and graft: Secondary | ICD-10-CM

## 2016-02-27 DIAGNOSIS — R0609 Other forms of dyspnea: Secondary | ICD-10-CM | POA: Diagnosis present

## 2016-02-27 DIAGNOSIS — Z862 Personal history of diseases of the blood and blood-forming organs and certain disorders involving the immune mechanism: Secondary | ICD-10-CM

## 2016-02-27 DIAGNOSIS — I2510 Atherosclerotic heart disease of native coronary artery without angina pectoris: Secondary | ICD-10-CM | POA: Diagnosis present

## 2016-02-27 DIAGNOSIS — Z7902 Long term (current) use of antithrombotics/antiplatelets: Secondary | ICD-10-CM

## 2016-02-27 LAB — COMPREHENSIVE METABOLIC PANEL
ALT: 12 U/L — ABNORMAL LOW (ref 17–63)
AST: 18 U/L (ref 15–41)
Albumin: 4.2 g/dL (ref 3.5–5.0)
Alkaline Phosphatase: 42 U/L (ref 38–126)
Anion gap: 7 (ref 5–15)
BUN: 19 mg/dL (ref 6–20)
CO2: 24 mmol/L (ref 22–32)
Calcium: 9 mg/dL (ref 8.9–10.3)
Chloride: 105 mmol/L (ref 101–111)
Creatinine, Ser: 1.12 mg/dL (ref 0.61–1.24)
GFR calc Af Amer: >60 mL/min (ref >60)
GFR calc non Af Amer: >60 mL/min (ref >60)
Glucose, Bld: 153 mg/dL — ABNORMAL HIGH (ref 65–99)
Potassium: 4.1 mmol/L (ref 3.5–5.1)
Sodium: 136 mmol/L (ref 135–145)
Total Bilirubin: 0.5 mg/dL (ref 0.3–1.2)
Total Protein: 7.9 g/dL (ref 6.5–8.1)

## 2016-02-27 LAB — I-STAT TROPONIN, ED: Troponin i, poc: 0 ng/mL (ref 0.00–0.08)

## 2016-02-27 LAB — URINALYSIS, ROUTINE W REFLEX MICROSCOPIC
Bilirubin Urine: NEGATIVE
Glucose, UA: NEGATIVE mg/dL
Hgb urine dipstick: NEGATIVE
Ketones, ur: NEGATIVE mg/dL
Leukocytes, UA: NEGATIVE
Nitrite: NEGATIVE
Protein, ur: NEGATIVE mg/dL
Specific Gravity, Urine: 1.012 (ref 1.005–1.030)
pH: 5 (ref 5.0–8.0)

## 2016-02-27 LAB — BRAIN NATRIURETIC PEPTIDE: B Natriuretic Peptide: 21 pg/mL (ref 0.0–100.0)

## 2016-02-27 LAB — LIPASE, BLOOD: Lipase: 45 U/L (ref 11–51)

## 2016-02-27 NOTE — ED Notes (Signed)
1x attempt at drawing labs - Unsuccesful - Pt requested to wait till he gets IV.

## 2016-02-27 NOTE — ED Notes (Signed)
Pt placed on monitor.  

## 2016-02-27 NOTE — ED Notes (Signed)
Pt family member also stated that pt had been bit by a tick about a week ago and hasn't been feeling good since. RN aware

## 2016-02-27 NOTE — ED Provider Notes (Signed)
CSN: [id number]     Arrival date & time 02/27/16  1356 History   First MD Initiated Contact with Patient 02/27/16 1916     Chief Complaint  Patient presents with  . Nausea  . Shortness of Breath  . Fatigue     (Consider location/radiation/quality/duration/timing/severity/associated sxs/prior Treatment) HPI   Blood pressure 148/78, pulse 79, temperature 98.5 F (36.9 C), temperature source Oral, resp. rate 15, SpO2 100 %.  [name] is a 70 y.o. male with past medical history significant for CAD, hypertension, hyperlipidemia, PVD, non-insulin-dependent diabetes complaining of generalized fatigue, nausea, dyspnea on exertion with left-sided chest discomfort which he describes like a "spider web" onset 4 days ago after patient was bitten by an orange tick that was likely not attached and not engorged. The tip bite was on the chest. States he had some redness in that area the day afterward. Daughter reports that he had to stop 3 times while mowing the lawn earlier this is atypical for him, states he couldn't walk from the house to the car without becoming extremely short of breath. Takes Plavix and had a low dose aspirin this morning. Patient endorses dry cough on review of systems without fever, chills, increasing peripheral edema, orthopnea, PND.  Cardiology: [name] GIBenson [place] PCP: Linna Darner  Past Medical History  Diagnosis Date  . Coronary artery disease   . Hypertension   . Hyperlipidemia   . PVD (peripheral vascular disease) ([place])   . Type II diabetes mellitus ([place])   . Chronic anemia   . Bleeding gastric ulcer 2000's  . History of blood transfusion 2000's    "related to bleeding gastric ulcer"  . GERD (gastroesophageal reflux disease)    Past Surgical History  Procedure Laterality Date  . Upper gi endoscopy  12/16/11    findings:  mild gastritis and duodenitis  . Peripheral vascular catheterization N/A 04/24/2015    Procedure: Lower Extremity Angiography;  Surgeon: [name],  MD;  Location: [place] [facility];  Service: Cardiovascular;  Laterality: N/A;  . Excisional hemorrhoidectomy  1980's  . Cardiac catheterization N/A 04/24/2015    Procedure: Left Heart Cath and Coronary Angiography;  Surgeon: [name], MD;  Location: [place] [facility];  Service: Cardiovascular;  Laterality: N/A;  . Cardiac catheterization N/A 04/24/2015    Procedure: Coronary Stent Intervention;  Surgeon: [name], MD;  Location: [place] [facility];  Service: Cardiovascular;  Laterality: N/A;  Prox RCA  . Coronary angioplasty    . Peripheral vascular catheterization N/A 05/29/2015    Procedure: Lower Extremity Angiography;  Surgeon: [name], MD;  Location: [place] [facility];  Service: Cardiovascular;  Laterality: N/A;  . Peripheral vascular catheterization Left 05/29/2015    Procedure: Renal Angiography;  Surgeon: [name], MD;  Location: [place] [facility];  Service: Cardiovascular;  Laterality: Left;  . Cardiac catheterization Right 05/29/2015    Procedure: Angioplasty Perioneal and SFA;  Surgeon: [name], MD;  Location: [place] [facility];  Service: Cardiovascular;  Laterality: Right;  . Peripheral vascular catheterization Left 06/12/2015    Procedure: Peripheral Vascular Atherectomy;  Surgeon: [name], MD;  Location: [place] [facility];  Service: Cardiovascular;  Laterality: Left;  AT  . Peripheral vascular catheterization Left 06/12/2015    Procedure: Peripheral Vascular Intervention;  Surgeon: [name], MD;  Location: [place] [facility];  Service: Cardiovascular;  Laterality: Left;  PTA TP TRUNK  . Esophagogastroduodenoscopy (egd) with propofol N/A 11/23/2015    Procedure: ESOPHAGOGASTRODUODENOSCOPY (EGD) WITH  PROPOFOL;  Surgeon: Carol Ada, MD;  [facility];  Service: Endoscopy;  Laterality: N/A;  . Colonoscopy with propofol N/A 11/23/2015    Procedure: COLONOSCOPY WITH PROPOFOL;  Surgeon: Carol Ada, MD;  [facility];  Service: Endoscopy;  Laterality: N/A;    History reviewed. No pertinent family history. Social History  Substance Use Topics  . Smoking status: Former Smoker -- 1.00 packs/day for 54 years    Types: Cigars, Cigarettes    Quit date: 02/16/2015  . Smokeless tobacco: Former Systems developer    Types: Chew     Comment: "chewed some when I was young"  . Alcohol Use: No     Comment: 04/24/2015 "no beer in 2 wks; ;no liquor in 3-4 weeks; I'm not a real heavy drinker" states he stopped drining 2 yrs ago    Review of Systems  10 systems reviewed and found to be negative, except as noted in the HPI.   Allergies  Review of patient's allergies indicates no known allergies.  Home Medications   Prior to Admission medications   Medication Sig Start Date End Date Taking? Authorizing Provider  aspirin 81 MG tablet Take 1 tablet (81 mg total) by mouth daily. 04/24/15   [name], MD  esomeprazole (NEXIUM) 40 MG packet Take 40 mg by mouth daily before breakfast. 10/31/15   [name], MD  Ferrous Sulfate (IRON) 28 MG TABS Take 28 mg by mouth daily.    Historical Provider, MD  ferrous sulfate 325 (65 FE) MG tablet Take 1 tablet (325 mg total) by mouth daily with breakfast. 10/31/15   [name], MD  glimepiride (AMARYL) 4 MG tablet Take 1 tablet (4 mg total) by mouth daily before breakfast. 10/31/15   [name], MD  HYDROcodone-homatropine [facility]) 5-1.5 MG/5ML syrup Take 5 mLs by mouth every 6 (six) hours as needed. 02/18/16   Leandrew Koyanagi, MD  lisinopril (PRINIVIL,ZESTRIL) 10 MG tablet Take 1 tablet (10 mg total) by mouth daily. 05/31/15   [name], MD  metFORMIN (GLUCOPHAGE-XR) 500 MG 24 hr tablet Take 1 tablet (500 mg total) by mouth daily with breakfast. 10/31/15   [name], MD  nitroGLYCERIN (NITROLINGUAL) 0.4 MG/SPRAY spray Place 1 spray under the tongue every 5 (five) minutes x 3 doses as needed for chest pain.    Historical Provider, MD  omeprazole (PRILOSEC) 20 MG capsule Take 2 capsules (40 mg total) by mouth  daily. Patient taking differently: Take 20 mg by mouth daily.  10/17/14 04/26/17  John C Pick-Jacobs, DO  prasugrel (EFFIENT) 10 MG TABS tablet Take 1 tablet (10 mg total) by mouth daily. 04/24/15   [name], MD  triamcinolone cream (KENALOG) 0.1 % Apply 1 application topically 2 (two) times daily. 02/18/16   Leandrew Koyanagi, MD   BP 148/78 mmHg  Pulse 79  Temp(Src) 98.5 F (36.9 C) (Oral)  Resp 15  SpO2 100% Physical Exam  Constitutional: He is oriented to person, place, and time. He appears well-developed and well-nourished. No distress.  HENT:  Head: Normocephalic and atraumatic.  Mouth/Throat: Oropharynx is clear and moist.  Eyes: Conjunctivae and EOM are normal. Pupils are equal, round, and reactive to light.  Neck: Normal range of motion.  Cardiovascular: Normal rate, regular rhythm and intact distal pulses.   Pulmonary/Chest: Effort normal and breath sounds normal. No respiratory distress. He has no wheezes. He has no rales. He exhibits no tenderness.  Abdominal: Soft. Bowel sounds are normal. He exhibits no distension  and no mass. There is no tenderness. There is no rebound and no guarding.  Genitourinary: Guaiac positive stool.  Digital rectal exam with normal rectal tone, normal stool color.    Musculoskeletal: Normal range of motion.  Neurological: He is alert and oriented to person, place, and time.  Skin: He is not diaphoretic.  Psychiatric: He has a normal mood and affect.  Nursing note and vitals reviewed.   ED Course  Procedures (including critical care time)  CRITICAL CARE Performed by: Monico Blitz   Total critical care time: 35 minutes  Critical care time was exclusive of separately billable procedures and treating other patients.  Critical care was necessary to treat or prevent imminent or life-threatening deterioration.  Critical care was time spent personally by me on the following activities: development of treatment plan with patient and/or  surrogate as well as nursing, discussions with consultants, evaluation of patient's response to treatment, examination of patient, obtaining history from patient or surrogate, ordering and performing treatments and interventions, ordering and review of laboratory studies, ordering and review of radiographic studies, pulse oximetry and re-evaluation of patient's condition.  Labs Review Labs Reviewed  COMPREHENSIVE METABOLIC PANEL - Abnormal; Notable for the following:    Glucose, Bld 153 (*)    ALT 12 (*)    All other components within normal limits  CBC WITH DIFFERENTIAL/PLATELET - Abnormal; Notable for the following:    RBC 2.72 (*)    Hemoglobin 6.2 (*)    HCT 20.3 (*)    MCV 74.6 (*)    MCH 22.8 (*)    RDW 19.3 (*)    All other components within normal limits  POC OCCULT BLOOD, ED - Abnormal; Notable for the following:    Fecal Occult Bld POSITIVE (*)    All other components within normal limits  LIPASE, BLOOD  URINALYSIS, ROUTINE W REFLEX MICROSCOPIC (NOT AT [facility])  BRAIN NATRIURETIC PEPTIDE  I-STAT TROPOININ, ED  PREPARE RBC (CROSSMATCH)  TYPE AND SCREEN    Imaging Review No results found. I have personally reviewed and evaluated these images and lab results as part of my medical decision-making.   EKG Interpretation None      MDM   Final diagnoses:  Gastrointestinal hemorrhage, unspecified gastritis, unspecified gastrointestinal hemorrhage type  Anemia, unspecified anemia type    Filed Vitals:   02/27/16 1930 02/27/16 1945 02/27/16 2000 02/27/16 2315  BP: 212/83 188/81 171/72 148/69  Pulse: 86 73  71  Temp:      TempSrc:      Resp: 20 17 14 17   SpO2: 100% 100%  100%    Medications  0.9 %  sodium chloride infusion (not administered)  pantoprazole (PROTONIX) 80 mg in sodium chloride 0.9 % 100 mL IVPB (80 mg Intravenous New Bag/Given 02/28/16 0107)  pantoprazole (PROTONIX) 80 mg in sodium chloride 0.9 % 250 mL (0.32 mg/mL) infusion (not administered)     [name] is 70 y.o. male presenting with *dyspnea on exertion, chest discomfort and generalized fatigue worsening over the course of a week, preceded by normally colored tick that possibly bit him. EKG without ischemic changes, proBNP negative, chest x-ray negative, lung sounds clear to auscultation, patient saturating well on room-air, physical exam is not consistent with DVT, doubt PE.  No significant electrolyte abnormality.  Hemoglobin 6.2. This is the third transfusions patient has had to have within either years. He had a colonoscopy that did not show any abnormalities within the last 6 months.   Guaiac positive, patient  started on bolus and drip of proton X. Will be admitted to Dr. Alcario Drought. Stable vital signs.  Monico Blitz, PA-C 02/28/16 [place], MD 03/01/16 [id number]

## 2016-02-27 NOTE — ED Notes (Addendum)
Pt reports SOB, Fatigue and nausea for the last week. States he is on blood thinners and has extensive heart hx. Pt in NAD at triage.   ADDENDUM: reports abscesses in the mouth. Not being treated for them, unable to afford oral surgery. States "I think this is making me feel numb like spider webs in my chest."

## 2016-02-27 NOTE — ED Notes (Signed)
Attempted to initiate IV x 1 without success.

## 2016-02-27 NOTE — ED Notes (Signed)
HGB 6.2, reported to Margaretha Sheffield, [place] and Dr. Alvino Chapel

## 2016-02-27 NOTE — ED Notes (Signed)
Pt stated "I pulled an orange tick off me the other day off my chest.  I ain't never seen an orange tick but it got all red around where it bit me.  I went to [facility] Monday & the;;y gave me some salve for the tick bite and some cough medicine."  Daughter stated "he's been having SOB".

## 2016-02-28 ENCOUNTER — Encounter (HOSPITAL_COMMUNITY): Payer: Self-pay | Admitting: Internal Medicine

## 2016-02-28 ENCOUNTER — Inpatient Hospital Stay (HOSPITAL_COMMUNITY): Payer: Medicare Other

## 2016-02-28 DIAGNOSIS — E785 Hyperlipidemia, unspecified: Secondary | ICD-10-CM | POA: Diagnosis present

## 2016-02-28 DIAGNOSIS — Z7902 Long term (current) use of antithrombotics/antiplatelets: Secondary | ICD-10-CM | POA: Diagnosis not present

## 2016-02-28 DIAGNOSIS — K5520 Angiodysplasia of colon without hemorrhage: Secondary | ICD-10-CM | POA: Diagnosis not present

## 2016-02-28 DIAGNOSIS — K2970 Gastritis, unspecified, without bleeding: Secondary | ICD-10-CM | POA: Diagnosis present

## 2016-02-28 DIAGNOSIS — E119 Type 2 diabetes mellitus without complications: Secondary | ICD-10-CM | POA: Diagnosis not present

## 2016-02-28 DIAGNOSIS — K317 Polyp of stomach and duodenum: Secondary | ICD-10-CM | POA: Diagnosis not present

## 2016-02-28 DIAGNOSIS — R933 Abnormal findings on diagnostic imaging of other parts of digestive tract: Secondary | ICD-10-CM | POA: Diagnosis not present

## 2016-02-28 DIAGNOSIS — Z7982 Long term (current) use of aspirin: Secondary | ICD-10-CM | POA: Diagnosis not present

## 2016-02-28 DIAGNOSIS — D500 Iron deficiency anemia secondary to blood loss (chronic): Secondary | ICD-10-CM | POA: Diagnosis not present

## 2016-02-28 DIAGNOSIS — R195 Other fecal abnormalities: Secondary | ICD-10-CM | POA: Diagnosis not present

## 2016-02-28 DIAGNOSIS — R5383 Other fatigue: Secondary | ICD-10-CM | POA: Diagnosis not present

## 2016-02-28 DIAGNOSIS — I2510 Atherosclerotic heart disease of native coronary artery without angina pectoris: Secondary | ICD-10-CM | POA: Diagnosis present

## 2016-02-28 DIAGNOSIS — Z8249 Family history of ischemic heart disease and other diseases of the circulatory system: Secondary | ICD-10-CM | POA: Diagnosis not present

## 2016-02-28 DIAGNOSIS — Z823 Family history of stroke: Secondary | ICD-10-CM | POA: Diagnosis not present

## 2016-02-28 DIAGNOSIS — I10 Essential (primary) hypertension: Secondary | ICD-10-CM | POA: Diagnosis not present

## 2016-02-28 DIAGNOSIS — D649 Anemia, unspecified: Secondary | ICD-10-CM | POA: Diagnosis present

## 2016-02-28 DIAGNOSIS — E663 Overweight: Secondary | ICD-10-CM | POA: Diagnosis present

## 2016-02-28 DIAGNOSIS — I209 Angina pectoris, unspecified: Secondary | ICD-10-CM | POA: Diagnosis not present

## 2016-02-28 DIAGNOSIS — R0602 Shortness of breath: Secondary | ICD-10-CM | POA: Diagnosis present

## 2016-02-28 DIAGNOSIS — K219 Gastro-esophageal reflux disease without esophagitis: Secondary | ICD-10-CM | POA: Diagnosis present

## 2016-02-28 DIAGNOSIS — Z79899 Other long term (current) drug therapy: Secondary | ICD-10-CM | POA: Diagnosis not present

## 2016-02-28 DIAGNOSIS — K921 Melena: Secondary | ICD-10-CM | POA: Diagnosis not present

## 2016-02-28 DIAGNOSIS — K922 Gastrointestinal hemorrhage, unspecified: Secondary | ICD-10-CM | POA: Diagnosis not present

## 2016-02-28 DIAGNOSIS — Z955 Presence of coronary angioplasty implant and graft: Secondary | ICD-10-CM | POA: Diagnosis not present

## 2016-02-28 DIAGNOSIS — Z87891 Personal history of nicotine dependence: Secondary | ICD-10-CM | POA: Diagnosis not present

## 2016-02-28 DIAGNOSIS — R110 Nausea: Secondary | ICD-10-CM | POA: Diagnosis not present

## 2016-02-28 DIAGNOSIS — R0609 Other forms of dyspnea: Secondary | ICD-10-CM | POA: Diagnosis present

## 2016-02-28 DIAGNOSIS — R079 Chest pain, unspecified: Secondary | ICD-10-CM | POA: Diagnosis present

## 2016-02-28 DIAGNOSIS — K625 Hemorrhage of anus and rectum: Secondary | ICD-10-CM | POA: Diagnosis not present

## 2016-02-28 DIAGNOSIS — E1151 Type 2 diabetes mellitus with diabetic peripheral angiopathy without gangrene: Secondary | ICD-10-CM | POA: Diagnosis present

## 2016-02-28 DIAGNOSIS — K5521 Angiodysplasia of colon with hemorrhage: Secondary | ICD-10-CM | POA: Diagnosis present

## 2016-02-28 DIAGNOSIS — K5731 Diverticulosis of large intestine without perforation or abscess with bleeding: Secondary | ICD-10-CM | POA: Diagnosis present

## 2016-02-28 DIAGNOSIS — Z8711 Personal history of peptic ulcer disease: Secondary | ICD-10-CM | POA: Diagnosis not present

## 2016-02-28 DIAGNOSIS — Z6830 Body mass index (BMI) 30.0-30.9, adult: Secondary | ICD-10-CM | POA: Diagnosis not present

## 2016-02-28 LAB — PROTIME-INR
INR: 1.12 (ref 0.00–1.49)
Prothrombin Time: 14.1 seconds (ref 11.6–15.2)

## 2016-02-28 LAB — BASIC METABOLIC PANEL
Anion gap: 5 (ref 5–15)
BUN: 18 mg/dL (ref 6–20)
CO2: 27 mmol/L (ref 22–32)
Calcium: 8.7 mg/dL — ABNORMAL LOW (ref 8.9–10.3)
Chloride: 105 mmol/L (ref 101–111)
Creatinine, Ser: 1.08 mg/dL (ref 0.61–1.24)
GFR calc Af Amer: >60 mL/min (ref >60)
GFR calc non Af Amer: >60 mL/min (ref >60)
Glucose, Bld: 164 mg/dL — ABNORMAL HIGH (ref 65–99)
Potassium: 4.1 mmol/L (ref 3.5–5.1)
Sodium: 137 mmol/L (ref 135–145)

## 2016-02-28 LAB — CBC WITH DIFFERENTIAL/PLATELET
Basophils Absolute: 0.1 10*3/uL (ref 0.0–0.1)
Basophils Relative: 1 %
Eosinophils Absolute: 0.2 10*3/uL (ref 0.0–0.7)
Eosinophils Relative: 2 %
HCT: 20.3 % — ABNORMAL LOW (ref 39.0–52.0)
Hemoglobin: 6.2 g/dL — CL (ref 13.0–17.0)
Lymphocytes Relative: 18 %
Lymphs Abs: 1.4 10*3/uL (ref 0.7–4.0)
MCH: 22.8 pg — ABNORMAL LOW (ref 26.0–34.0)
MCHC: 30.5 g/dL (ref 30.0–36.0)
MCV: 74.6 fL — ABNORMAL LOW (ref 78.0–100.0)
Monocytes Absolute: 0.5 10*3/uL (ref 0.1–1.0)
Monocytes Relative: 6 %
Neutro Abs: 5.7 10*3/uL (ref 1.7–7.7)
Neutrophils Relative %: 73 %
Platelets: 304 10*3/uL (ref 150–400)
RBC: 2.72 MIL/uL — ABNORMAL LOW (ref 4.22–5.81)
RDW: 19.3 % — ABNORMAL HIGH (ref 11.5–15.5)
WBC: 7.9 10*3/uL (ref 4.0–10.5)

## 2016-02-28 LAB — HEMOGLOBIN AND HEMATOCRIT, BLOOD
HCT: 26.2 % — ABNORMAL LOW (ref 39.0–52.0)
HCT: 26.5 % — ABNORMAL LOW (ref 39.0–52.0)
Hemoglobin: 8.1 g/dL — ABNORMAL LOW (ref 13.0–17.0)
Hemoglobin: 8.1 g/dL — ABNORMAL LOW (ref 13.0–17.0)

## 2016-02-28 LAB — GLUCOSE, CAPILLARY
Glucose-Capillary: 175 mg/dL — ABNORMAL HIGH (ref 65–99)
Glucose-Capillary: 162 mg/dL — ABNORMAL HIGH (ref 65–99)
Glucose-Capillary: 261 mg/dL — ABNORMAL HIGH (ref 65–99)
Glucose-Capillary: 149 mg/dL — ABNORMAL HIGH (ref 65–99)

## 2016-02-28 LAB — CBC
HCT: 22 % — ABNORMAL LOW (ref 39.0–52.0)
Hemoglobin: 6.9 g/dL — CL (ref 13.0–17.0)
MCH: 23.8 pg — ABNORMAL LOW (ref 26.0–34.0)
MCHC: 31.4 g/dL (ref 30.0–36.0)
MCV: 75.9 fL — ABNORMAL LOW (ref 78.0–100.0)
Platelets: 293 10*3/uL (ref 150–400)
RBC: 2.9 MIL/uL — ABNORMAL LOW (ref 4.22–5.81)
RDW: 19.7 % — ABNORMAL HIGH (ref 11.5–15.5)
WBC: 7.6 10*3/uL (ref 4.0–10.5)

## 2016-02-28 LAB — PREPARE RBC (CROSSMATCH)

## 2016-02-28 LAB — POC OCCULT BLOOD, ED: Fecal Occult Bld: POSITIVE — AB

## 2016-02-28 MED ORDER — GLIMEPIRIDE 4 MG PO TABS
4.0000 mg | ORAL_TABLET | Freq: Every day | ORAL | Status: DC
  Filled 2016-02-28: qty 1

## 2016-02-28 MED ORDER — SODIUM CHLORIDE 0.9 % IV SOLN
80.0000 mg | Freq: Once | INTRAVENOUS | Status: AC
  Administered 2016-02-28: 80 mg via INTRAVENOUS
  Filled 2016-02-28: qty 80

## 2016-02-28 MED ORDER — FERROUS SULFATE 325 (65 FE) MG PO TABS
325.0000 mg | ORAL_TABLET | Freq: Every day | ORAL | Status: DC
  Administered 2016-02-28 – 2016-03-01 (×2): 325 mg via ORAL
  Filled 2016-02-28 (×2): qty 1

## 2016-02-28 MED ORDER — INSULIN ASPART 100 UNIT/ML ~~LOC~~ SOLN
0.0000 [IU] | SUBCUTANEOUS | Status: DC
  Administered 2016-02-28: 2 [IU] via SUBCUTANEOUS
  Administered 2016-02-28: 5 [IU] via SUBCUTANEOUS
  Administered 2016-02-28 – 2016-02-29 (×3): 1 [IU] via SUBCUTANEOUS
  Administered 2016-02-29: 2 [IU] via SUBCUTANEOUS
  Administered 2016-03-01: 3 [IU] via SUBCUTANEOUS
  Administered 2016-03-01: 2 [IU] via SUBCUTANEOUS
  Administered 2016-03-01: 1 [IU] via SUBCUTANEOUS

## 2016-02-28 MED ORDER — ASPIRIN EC 81 MG PO TBEC
81.0000 mg | DELAYED_RELEASE_TABLET | Freq: Every day | ORAL | Status: DC
  Administered 2016-02-28 – 2016-03-01 (×2): 81 mg via ORAL
  Filled 2016-02-28 (×2): qty 1

## 2016-02-28 MED ORDER — SODIUM CHLORIDE 0.9 % IV SOLN
8.0000 mg/h | INTRAVENOUS | Status: DC
  Administered 2016-02-28 – 2016-02-29 (×4): 8 mg/h via INTRAVENOUS
  Filled 2016-02-28 (×10): qty 80

## 2016-02-28 MED ORDER — SODIUM CHLORIDE 0.9 % IV SOLN
Freq: Once | INTRAVENOUS | Status: AC
  Administered 2016-02-28: 13:00:00 via INTRAVENOUS

## 2016-02-28 MED ORDER — TECHNETIUM TC 99M-LABELED RED BLOOD CELLS IV KIT
25.2000 | PACK | Freq: Once | INTRAVENOUS | Status: AC | PRN
  Administered 2016-02-28: 25.2 via INTRAVENOUS

## 2016-02-28 MED ORDER — HYDROCODONE-HOMATROPINE 5-1.5 MG/5ML PO SYRP: 5.0000 mL | ORAL_SOLUTION | Freq: Four times a day (QID) | ORAL | Status: DC | PRN

## 2016-02-28 MED ORDER — LISINOPRIL 10 MG PO TABS
10.0000 mg | ORAL_TABLET | Freq: Every day | ORAL | Status: DC
  Administered 2016-02-28: 10 mg via ORAL
  Filled 2016-02-28: qty 1

## 2016-02-28 MED ORDER — SODIUM CHLORIDE 0.9 % IV SOLN
INTRAVENOUS | Status: DC
  Administered 2016-02-28: 17:00:00 via INTRAVENOUS

## 2016-02-28 MED ORDER — SODIUM CHLORIDE 0.9 % IV SOLN
10.0000 mL/h | Freq: Once | INTRAVENOUS | Status: AC
  Administered 2016-02-28: 10 mL/h via INTRAVENOUS

## 2016-02-28 MED ORDER — METOPROLOL SUCCINATE ER 50 MG PO TB24
50.0000 mg | ORAL_TABLET | Freq: Every day | ORAL | Status: DC
  Administered 2016-02-28 – 2016-03-01 (×2): 50 mg via ORAL
  Filled 2016-02-28 (×2): qty 1

## 2016-02-28 MED ORDER — SODIUM CHLORIDE 0.9 % IV SOLN
INTRAVENOUS | Status: DC
  Administered 2016-02-28: 14:00:00 via INTRAVENOUS

## 2016-02-28 MED ORDER — PEG 3350-KCL-NA BICARB-NACL 420 G PO SOLR: 4000.0000 mL | Freq: Once | ORAL | Status: DC

## 2016-02-28 NOTE — H&P (Signed)
History and Physical    [name] [id number] DOB: 1945-11-14 DOA: 02/27/2016   PCP: Ruben Reason, MD Chief Complaint:  Chief Complaint  Patient presents with  . Nausea  . Shortness of Breath  . Fatigue    HPI: [name] is a 70 y.o. male with medical history significant of recent LGIB with BRBPR in April of this year, upper and lower EGD negative except for diverticulosis, CAD with stent to RCA last Sept, PVD with PTAs in October, HTN, DM2.  Patient presents to the ED with c/o nausea, SOB, fatigue.  Patient also has left sided chest discomfort that feels like "a spider web" symptoms onset 4 days ago after he was bitten by an orange colored tick.  Had some redness around tick bite the day after but no rash since, no fever, no chills, no headache.  Activity makes symptoms worse, rest makes them better.  Patient has had to stop and rest when mowing lawn which is atypical for him.  Couldn't walk to house or car without becoming extremely short of breath.  ED Course: Work up in ED is significant for HGB of 6.2, guiac positive stool.  Patient states he has been having black colored stools but they have been formed, no BRBPR since April of this year.  No abdominal pain.  EDP has ordered 1 unit for transfusion, troponin negative.  Review of Systems: As per HPI otherwise 10 point review of systems negative.    Past Medical History  Diagnosis Date  . Coronary artery disease   . Hypertension   . Hyperlipidemia   . PVD (peripheral vascular disease) ([place])   . Type II diabetes mellitus ([place])   . Chronic anemia   . Bleeding gastric ulcer 2000's  . History of blood transfusion 2000's    "related to bleeding gastric ulcer"  . GERD (gastroesophageal reflux disease)     Past Surgical History  Procedure Laterality Date  . Upper gi endoscopy  12/16/11    findings:  mild gastritis and duodenitis  . Peripheral vascular catheterization N/A 04/24/2015    Procedure: Lower Extremity  Angiography;  Surgeon: [name], MD;  Location: [place] [facility];  Service: Cardiovascular;  Laterality: N/A;  . Excisional hemorrhoidectomy  1980's  . Cardiac catheterization N/A 04/24/2015    Procedure: Left Heart Cath and Coronary Angiography;  Surgeon: [name], MD;  Location: [place] [facility];  Service: Cardiovascular;  Laterality: N/A;  . Cardiac catheterization N/A 04/24/2015    Procedure: Coronary Stent Intervention;  Surgeon: [name], MD;  Location: [place] [facility];  Service: Cardiovascular;  Laterality: N/A;  Prox RCA  . Coronary angioplasty    . Peripheral vascular catheterization N/A 05/29/2015    Procedure: Lower Extremity Angiography;  Surgeon: [name], MD;  Location: [place] [facility];  Service: Cardiovascular;  Laterality: N/A;  . Peripheral vascular catheterization Left 05/29/2015    Procedure: Renal Angiography;  Surgeon: [name], MD;  Location: [place] [facility];  Service: Cardiovascular;  Laterality: Left;  . Cardiac catheterization Right 05/29/2015    Procedure: Angioplasty Perioneal and SFA;  Surgeon: [name], MD;  Location: [place] [facility];  Service: Cardiovascular;  Laterality: Right;  . Peripheral vascular catheterization Left 06/12/2015    Procedure: Peripheral Vascular Atherectomy;  Surgeon: [name], MD;  Location: [place] [facility];  Service: Cardiovascular;  Laterality: Left;  AT  . Peripheral vascular catheterization Left 06/12/2015    Procedure: Peripheral Vascular Intervention;  Surgeon: [name], MD;  Location: [place] [facility];  Service: Cardiovascular;  Laterality: Left;  PTA TP TRUNK  . Esophagogastroduodenoscopy (egd) with propofol N/A 11/23/2015    Procedure: ESOPHAGOGASTRODUODENOSCOPY (EGD) WITH PROPOFOL;  Surgeon: Carol Ada, MD;  [facility];  Service: Endoscopy;  Laterality: N/A;  . Colonoscopy with propofol N/A 11/23/2015    Procedure: COLONOSCOPY WITH PROPOFOL;  Surgeon: Carol Ada, MD;  [facility];   Service: Endoscopy;  Laterality: N/A;     reports that he quit smoking about a year ago. His smoking use included Cigars and Cigarettes. He has a 54 pack-year smoking history. He has quit using smokeless tobacco. His smokeless tobacco use included Chew. He reports that he does not drink alcohol or use illicit drugs.  No Known Allergies  Family History  Problem Relation Age of Onset  . CVA    . Hypertension       Prior to Admission medications   Medication Sig Start Date End Date Taking? Authorizing Provider  aspirin 81 MG tablet Take 1 tablet (81 mg total) by mouth daily. 04/24/15  Yes [name], MD  clopidogrel (PLAVIX) 75 MG tablet Take 75 mg by mouth daily.   Yes Historical Provider, MD  esomeprazole (NEXIUM) 40 MG packet Take 40 mg by mouth daily before breakfast. 10/31/15  Yes [name], MD  ferrous sulfate 325 (65 FE) MG tablet Take 1 tablet (325 mg total) by mouth daily with breakfast. 10/31/15  Yes [name], MD  glimepiride (AMARYL) 4 MG tablet Take 1 tablet (4 mg total) by mouth daily before breakfast. 10/31/15  Yes [name], MD  HYDROcodone-homatropine [facility]) 5-1.5 MG/5ML syrup Take 5 mLs by mouth every 6 (six) hours as needed. Patient taking differently: Take 5 mLs by mouth every 6 (six) hours as needed for cough.  02/18/16  Yes Leandrew Koyanagi, MD  lisinopril (PRINIVIL,ZESTRIL) 10 MG tablet Take 1 tablet (10 mg total) by mouth daily. 05/31/15  Yes [name], MD  metoprolol succinate (TOPROL-XL) 50 MG 24 hr tablet Take 50 mg by mouth daily. Take with or immediately following a meal.   Yes Historical Provider, MD  nitroGLYCERIN (NITROLINGUAL) 0.4 MG/SPRAY spray Place 1 spray under the tongue every 5 (five) minutes x 3 doses as needed for chest pain.   Yes Historical Provider, MD  triamcinolone cream (KENALOG) 0.1 % Apply 1 application topically 2 (two) times daily. 02/18/16  Yes Leandrew Koyanagi, MD    Physical Exam: Filed Vitals:   02/28/16 0200 02/28/16 0215  02/28/16 0230 02/28/16 0246  BP: 167/89 167/91 179/86 172/79  Pulse: 81 79 79 83  Temp:    98.6 F (37 C)  TempSrc:    Oral  Resp: 21 19 20 20   Height:    5\' 8"  (1.727 m)  Weight:    89.812 kg (198 lb)  SpO2: 100% 100% 100% 100%      Constitutional: NAD, calm, comfortable Eyes: PERRL, lids and conjunctivae normal ENMT: Mucous membranes are moist. Posterior pharynx clear of any exudate or lesions.Normal dentition.  Neck: normal, supple, no masses, no thyromegaly Respiratory: clear to auscultation bilaterally, no wheezing, no crackles. Normal respiratory effort. No accessory muscle use.  Cardiovascular: Regular rate and rhythm, no murmurs / rubs / gallops. No extremity edema. 2+ pedal pulses. No carotid bruits.  Abdomen: no tenderness, no masses palpated. No hepatosplenomegaly. Bowel sounds positive.  Musculoskeletal: no clubbing / cyanosis. No joint deformity upper and lower extremities. Good ROM, no contractures. Normal muscle tone.  Skin: no rashes, lesions, ulcers. No induration Neurologic: CN 2-12 grossly intact. Sensation intact, DTR normal. Strength 5/5 in all 4.  Psychiatric: Normal judgment and insight. Alert and oriented x 3. Normal mood.    Labs on Admission: I have personally reviewed following labs and imaging studies  CBC:  Recent Labs Lab 02/27/16 2329  WBC 7.9  NEUTROABS 5.7  HGB 6.2*  HCT 20.3*  MCV 74.6*  PLT 123456   Basic Metabolic Panel:  Recent Labs Lab 02/27/16 2014  NA 136  K 4.1  CL 105  CO2 24  GLUCOSE 153*  BUN 19  CREATININE 1.12  CALCIUM 9.0   GFR: Estimated Creatinine Clearance: 65.9 mL/min (by C-G formula based on Cr of 1.12). Liver Function Tests:  Recent Labs Lab 02/27/16 2014  AST 18  ALT 12*  ALKPHOS 42  BILITOT 0.5  PROT 7.9  ALBUMIN 4.2    Recent Labs Lab 02/27/16 2014  LIPASE 45   No results for input(s): AMMONIA in the last 168 hours. Coagulation Profile: No results for input(s): INR, PROTIME in the last  168 hours. Cardiac Enzymes: No results for input(s): CKTOTAL, CKMB, CKMBINDEX, TROPONINI in the last 168 hours. BNP (last 3 results) No results for input(s): PROBNP in the last 8760 hours. HbA1C: No results for input(s): HGBA1C in the last 72 hours. CBG: No results for input(s): GLUCAP in the last 168 hours. Lipid Profile: No results for input(s): CHOL, HDL, LDLCALC, TRIG, CHOLHDL, LDLDIRECT in the last 72 hours. Thyroid Function Tests: No results for input(s): TSH, T4TOTAL, FREET4, T3FREE, THYROIDAB in the last 72 hours. Anemia Panel: No results for input(s): VITAMINB12, FOLATE, FERRITIN, TIBC, IRON, RETICCTPCT in the last 72 hours. Urine analysis:    Component Value Date/Time   COLORURINE YELLOW 02/27/2016 1700   APPEARANCEUR CLEAR 02/27/2016 1700   LABSPEC 1.012 02/27/2016 1700   PHURINE 5.0 02/27/2016 1700   GLUCOSEU NEGATIVE 02/27/2016 1700   HGBUR NEGATIVE 02/27/2016 1700   BILIRUBINUR NEGATIVE 02/27/2016 1700   BILIRUBINUR neg 10/17/2014 2042   KETONESUR NEGATIVE 02/27/2016 1700   PROTEINUR NEGATIVE 02/27/2016 1700   PROTEINUR neg 10/17/2014 2042   UROBILINOGEN 0.2 10/17/2014 2042   UROBILINOGEN 0.2 08/26/2010 0414   NITRITE NEGATIVE 02/27/2016 1700   NITRITE neg 10/17/2014 2042   LEUKOCYTESUR NEGATIVE 02/27/2016 1700   Sepsis Labs: @LABRCNTIP (procalcitonin:4,lacticidven:4) )No results found for this or any previous visit (from the past 240 hour(s)).   Radiological Exams on Admission: Dg Chest 2 View  02/27/2016  CLINICAL DATA:  70 year old male with shortness of breath EXAM: CHEST  2 VIEW COMPARISON:  Chest radiograph dated 08/25/2010 FINDINGS: Two views of the chest demonstrate clear lungs. There is no pleural effusion or pneumothorax the cardiac silhouette is within normal limits. The aorta is tortuous. No acute osseous pathology. IMPRESSION: No active cardiopulmonary disease. Electronically Signed   By: Anner Crete M.D.   On: 02/27/2016 21:31    EKG:  Independently reviewed.  Assessment/Plan Principal Problem:   Anemia due to chronic blood loss Active Problems:   DM2 (diabetes mellitus, type 2) (HCC)   Essential hypertension   Chest pain   Anemia due to chronic blood loss - appears to be having chronic LGIB  Transfusing 1 unit for symptomatic anemia  Call GI in AM, though with out active large scale bleed and formed stools, doubt that urgent re-scoping of the patient will be their next recommended step.  Perhaps a capsule endoscopy since he hasnt had this yet?  Diverticular bleed seems  likely with diverticulosis on colonoscopy in April.  Will get NM tagged RBC scan but I fear this may end up negative due to lack of large acute bleeding symptoms  He states he is already taking iron supplementation as prescribed  Due to hemoccult positive stool and ongoing blood loss anemia, feel that we probably have little choice at the moment but to hold Plavix, though I am certainly not enthusiastic about holding plavix in this patient who is less than a year out from coronary stent placement and PAD arthrectomies (both last fall).  DM2 - holding Sulfonylurea and putting on sensitive scale SSI while NPO for tagged RBC scan  HTN - continue home meds  Chest pain - likely ischemic chest pain due to symptomatic anemia above in setting of known CAD  Tele monitor     DVT prophylaxis: SCDs Code Status: Full Family Communication: Family at bedside Consults called: None Admission status: Admit to inpatient   [name] DO [facility] Pager [phone] from 7PM-7AM  If 7AM-7PM, please contact the day physician for the patient [email or web address] Password [email or web address]  02/28/2016, 3:37 AM

## 2016-02-28 NOTE — Consult Note (Signed)
Reason for Consult: Anemia Referring Physician: [facility]  [name] HPI: This is a 70 year old male with a PMH of IDA s/p EGD/Colonoscopies on 12/16/2011 and 11/23/2015, CAD, PVD, s/p stenting, HTN, and hyperlipidemia admitted for complaints of symptomatic anemia.  His symptoms were noticeable when he was mowing his lawn and he was experiencing nausea, SOB, and fatigue.  In the ER he was identified to have an HGB of 6.9 g/dL and he reports melena over the past several months.  The most recent EGD/Colonoscopy was only significant for right sided diverticula.  In March 2016 his HGB was in the 14 range.  The original intent for the work up of his anemia in 2013 was to pursue a capsule endoscopy after the negative EGD/Colonoscopy in 2013, but his anemia resolved and remained stable for several years.  During this hospitalization the bleeding scan suggests a small bowel bleeding site in the RLQ.   Past Medical History  Diagnosis Date  . Coronary artery disease   . Hypertension   . Hyperlipidemia   . PVD (peripheral vascular disease) ([place])   . Type II diabetes mellitus ([place])   . Chronic anemia   . Bleeding gastric ulcer 2000's  . History of blood transfusion 2000's    "related to bleeding gastric ulcer"  . GERD (gastroesophageal reflux disease)     Past Surgical History  Procedure Laterality Date  . Upper gi endoscopy  12/16/11    findings:  mild gastritis and duodenitis  . Peripheral vascular catheterization N/A 04/24/2015    Procedure: Lower Extremity Angiography;  Surgeon: [name], MD;  Location: [place] [facility];  Service: Cardiovascular;  Laterality: N/A;  . Excisional hemorrhoidectomy  1980's  . Cardiac catheterization N/A 04/24/2015    Procedure: Left Heart Cath and Coronary Angiography;  Surgeon: [name], MD;  Location: [place] [facility];  Service: Cardiovascular;  Laterality: N/A;  . Cardiac catheterization N/A 04/24/2015    Procedure: Coronary Stent Intervention;   Surgeon: [name], MD;  Location: [place] [facility];  Service: Cardiovascular;  Laterality: N/A;  Prox RCA  . Coronary angioplasty    . Peripheral vascular catheterization N/A 05/29/2015    Procedure: Lower Extremity Angiography;  Surgeon: [name], MD;  Location: [place] [facility];  Service: Cardiovascular;  Laterality: N/A;  . Peripheral vascular catheterization Left 05/29/2015    Procedure: Renal Angiography;  Surgeon: [name], MD;  Location: [place] [facility];  Service: Cardiovascular;  Laterality: Left;  . Cardiac catheterization Right 05/29/2015    Procedure: Angioplasty Perioneal and SFA;  Surgeon: [name], MD;  Location: [place] [facility];  Service: Cardiovascular;  Laterality: Right;  . Peripheral vascular catheterization Left 06/12/2015    Procedure: Peripheral Vascular Atherectomy;  Surgeon: [name], MD;  Location: [place] [facility];  Service: Cardiovascular;  Laterality: Left;  AT  . Peripheral vascular catheterization Left 06/12/2015    Procedure: Peripheral Vascular Intervention;  Surgeon: [name], MD;  Location: [place] [facility];  Service: Cardiovascular;  Laterality: Left;  PTA TP TRUNK  . Esophagogastroduodenoscopy (egd) with propofol N/A 11/23/2015    Procedure: ESOPHAGOGASTRODUODENOSCOPY (EGD) WITH PROPOFOL;  Surgeon: Carol Ada, MD;  Location: [facility];  Service: Endoscopy;  Laterality: N/A;  . Colonoscopy with propofol N/A 11/23/2015    Procedure: COLONOSCOPY WITH PROPOFOL;  Surgeon: Carol Ada, MD;  Location: [facility];  Service: Endoscopy;  Laterality: N/A;    Family History  Problem Relation Age of Onset  . CVA    .  Hypertension      Social History:  reports that he quit smoking about a year ago. His smoking use included Cigars and Cigarettes. He has a 54 pack-year smoking history. He has quit using smokeless tobacco. His smokeless tobacco use included Chew. He reports that he does not drink alcohol or use illicit drugs.  Allergies: No Known  Allergies  Medications:  Scheduled: . aspirin EC  81 mg Oral Daily  . ferrous sulfate  325 mg Oral Q breakfast  . insulin aspart  0-9 Units Subcutaneous Q4H  . metoprolol succinate  50 mg Oral Daily   Continuous: . sodium chloride 50 mL/hr at 02/28/16 1345  . pantoprozole (PROTONIX) infusion 8 mg/hr (02/28/16 0302)    Results for orders placed or performed during the hospital encounter of 02/27/16 (from the past 24 hour(s))  Urinalysis, Routine w reflex microscopic     Status: None   Collection Time: 02/27/16  5:00 PM  Result Value Ref Range   Color, Urine YELLOW YELLOW   APPearance CLEAR CLEAR   Specific Gravity, Urine 1.012 1.005 - 1.030   pH 5.0 5.0 - 8.0   Glucose, UA NEGATIVE NEGATIVE mg/dL   Hgb urine dipstick NEGATIVE NEGATIVE   Bilirubin Urine NEGATIVE NEGATIVE   Ketones, ur NEGATIVE NEGATIVE mg/dL   Protein, ur NEGATIVE NEGATIVE mg/dL   Nitrite NEGATIVE NEGATIVE   Leukocytes, UA NEGATIVE NEGATIVE  Brain natriuretic peptide     Status: None   Collection Time: 02/27/16  8:13 PM  Result Value Ref Range   B Natriuretic Peptide 21.0 0.0 - 100.0 pg/mL  Lipase, blood     Status: None   Collection Time: 02/27/16  8:14 PM  Result Value Ref Range   Lipase 45 11 - 51 U/L  Comprehensive metabolic panel     Status: Abnormal   Collection Time: 02/27/16  8:14 PM  Result Value Ref Range   Sodium 136 135 - 145 mmol/L   Potassium 4.1 3.5 - 5.1 mmol/L   Chloride 105 101 - 111 mmol/L   CO2 24 22 - 32 mmol/L   Glucose, Bld 153 (H) 65 - 99 mg/dL   BUN 19 6 - 20 mg/dL   Creatinine, Ser 1.12 0.61 - 1.24 mg/dL   Calcium 9.0 8.9 - 10.3 mg/dL   Total Protein 7.9 6.5 - 8.1 g/dL   Albumin 4.2 3.5 - 5.0 g/dL   AST 18 15 - 41 U/L   ALT 12 (L) 17 - 63 U/L   Alkaline Phosphatase 42 38 - 126 U/L   Total Bilirubin 0.5 0.3 - 1.2 mg/dL   GFR calc non Af Amer >60 >60 mL/min   GFR calc Af Amer >60 >60 mL/min   Anion gap 7 5 - 15  I-Stat Troponin, ED (not at [facility])     Status: None    Collection Time: 02/27/16  8:18 PM  Result Value Ref Range   Troponin i, poc 0.00 0.00 - 0.08 ng/mL   Comment 3          CBC with Differential/Platelet     Status: Abnormal   Collection Time: 02/27/16 11:29 PM  Result Value Ref Range   WBC 7.9 4.0 - 10.5 K/uL   RBC 2.72 (L) 4.22 - 5.81 MIL/uL   Hemoglobin 6.2 (LL) 13.0 - 17.0 g/dL   HCT 20.3 (L) 39.0 - 52.0 %   MCV 74.6 (L) 78.0 - 100.0 fL   MCH 22.8 (L) 26.0 - 34.0 pg   MCHC 30.5 30.0 -  36.0 g/dL   RDW 19.3 (H) 11.5 - 15.5 %   Platelets 304 150 - 400 K/uL   Neutrophils Relative % 73 %   Lymphocytes Relative 18 %   Monocytes Relative 6 %   Eosinophils Relative 2 %   Basophils Relative 1 %   Neutro Abs 5.7 1.7 - 7.7 K/uL   Lymphs Abs 1.4 0.7 - 4.0 K/uL   Monocytes Absolute 0.5 0.1 - 1.0 K/uL   Eosinophils Absolute 0.2 0.0 - 0.7 K/uL   Basophils Absolute 0.1 0.0 - 0.1 K/uL   RBC Morphology POLYCHROMASIA PRESENT    Smear Review LARGE PLATELETS PRESENT   POC occult blood, ED     Status: Abnormal   Collection Time: 02/28/16 12:22 AM  Result Value Ref Range   Fecal Occult Bld POSITIVE (A) NEGATIVE  Type and screen [place]     Status: None (Preliminary result)   Collection Time: 02/28/16 12:43 AM  Result Value Ref Range   ABO/RH(D) B POS    Antibody Screen NEG    Sample Expiration 03/02/2016    Unit Number [id number]    Blood Component Type RED CELLS,LR    Unit division 00    Status of Unit ISSUED    Transfusion Status OK TO TRANSFUSE    Crossmatch Result Compatible    Unit Number [id number]    Blood Component Type RED CELLS,LR    Unit division 00    Status of Unit ISSUED    Transfusion Status OK TO TRANSFUSE    Crossmatch Result Compatible    Unit Number [id number]    Blood Component Type RBC LR PHER1    Unit division 00    Status of Unit ALLOCATED    Transfusion Status OK TO TRANSFUSE    Crossmatch Result Compatible   Prepare RBC     Status: None   Collection Time: 02/28/16 12:49 AM   Result Value Ref Range   Order Confirmation ORDER PROCESSED BY BLOOD BANK   Glucose, capillary     Status: Abnormal   Collection Time: 02/28/16  4:07 AM  Result Value Ref Range   Glucose-Capillary 175 (H) 65 - 99 mg/dL  CBC     Status: Abnormal   Collection Time: 02/28/16  7:44 AM  Result Value Ref Range   WBC 7.6 4.0 - 10.5 K/uL   RBC 2.90 (L) 4.22 - 5.81 MIL/uL   Hemoglobin 6.9 (LL) 13.0 - 17.0 g/dL   HCT 22.0 (L) 39.0 - 52.0 %   MCV 75.9 (L) 78.0 - 100.0 fL   MCH 23.8 (L) 26.0 - 34.0 pg   MCHC 31.4 30.0 - 36.0 g/dL   RDW 19.7 (H) 11.5 - 15.5 %   Platelets 293 150 - 400 K/uL  Basic metabolic panel     Status: Abnormal   Collection Time: 02/28/16  7:44 AM  Result Value Ref Range   Sodium 137 135 - 145 mmol/L   Potassium 4.1 3.5 - 5.1 mmol/L   Chloride 105 101 - 111 mmol/L   CO2 27 22 - 32 mmol/L   Glucose, Bld 164 (H) 65 - 99 mg/dL   BUN 18 6 - 20 mg/dL   Creatinine, Ser 1.08 0.61 - 1.24 mg/dL   Calcium 8.7 (L) 8.9 - 10.3 mg/dL   GFR calc non Af Amer >60 >60 mL/min   GFR calc Af Amer >60 >60 mL/min   Anion gap 5 5 - 15  Glucose, capillary     Status: Abnormal  Collection Time: 02/28/16  7:51 AM  Result Value Ref Range   Glucose-Capillary 162 (H) 65 - 99 mg/dL  Prepare RBC     Status: None   Collection Time: 02/28/16  9:30 AM  Result Value Ref Range   Order Confirmation ORDER PROCESSED BY BLOOD BANK      Dg Chest 2 View  02/27/2016  CLINICAL DATA:  70 year old male with shortness of breath EXAM: CHEST  2 VIEW COMPARISON:  Chest radiograph dated 08/25/2010 FINDINGS: Two views of the chest demonstrate clear lungs. There is no pleural effusion or pneumothorax the cardiac silhouette is within normal limits. The aorta is tortuous. No acute osseous pathology. IMPRESSION: No active cardiopulmonary disease. Electronically Signed   By: Anner Crete M.D.   On: 02/27/2016 21:31   Nm Gi Blood Loss  02/28/2016  CLINICAL DATA:  Anemia, black stools. History of bright red blood  per rectum and the pleural. EXAM: NUCLEAR MEDICINE GASTROINTESTINAL BLEEDING SCAN TECHNIQUE: Sequential abdominal images were obtained following intravenous administration of Tc-24m labeled red blood cells. RADIOPHARMACEUTICALS:  25.2 mCi Tc-35m in-vitro labeled red cells. COMPARISON:  CT 09/27/2004 FINDINGS: Anterior planar imaging of the abdomen pelvis was obtained for 2 hours. During the second hour of imaging, focus of radiotracer accumulates in the RIGHT lower quadrant. The pattern of accumulation is tubular and serpiginous with a relatively narrow diameter. Findings are consistent with active gastrointestinal bleeding and favor small bowel of the RIGHT lower quadrant. IMPRESSION: Small volume active gastrointestinal bleeding within the small bowel localizing to the RIGHT lower quadrant. These results will be called to the ordering clinician or representative by the Radiologist Assistant, and communication documented in the PACS or zVision Dashboard. Electronically Signed   By: Suzy Bouchard M.D.   On: 02/28/2016 13:24    ROS:  As stated above in the HPI otherwise negative.  Blood pressure 133/44, pulse 79, temperature 98.6 F (37 C), temperature source Oral, resp. rate 16, height 5\' 8"  (1.727 m), weight 89.812 kg (198 lb), SpO2 100 %.    PE: Gen: NAD, Alert and Oriented HEENT:  [place]/AT, EOMI Neck: Supple, no LAD Lungs: CTA Bilaterally CV: RRR without M/G/R ABM: Soft, NTND, +BS Ext: No C/C/E  Assessment/Plan: 1) Small bowel bleeding source. 2) Anemia. 3) CAD/PVD.   It is difficult to discern the exact location of the bleeding site with the bleeding scan, but Radiology reports that it is in the TI.  I do not know what portion of the TI that the bleeding occurred.  I had a long discussion with the patient and his family and the plan is to pursue a repeat colonoscopy with deep TI intubation.  I do not know if I will be able to reach the site, but I was clear about this issue with the  patient.  If the procedure is not successful, I will order a capsule endoscopy.  Most likely he will benefit from a double balloon enteroscopy at [facility] if I cannot localize the bleeding site.  Plan: 1) Colonoscopy tomorrow. 2) Capsule endoscopy if colonoscopy is unsuccessful.  Joram Venson D 02/28/2016, 3:33 PM

## 2016-02-28 NOTE — ED Notes (Signed)
Protonix infusing on pump via LFA site.

## 2016-02-28 NOTE — ED Notes (Signed)
Blood Administration Consent signed.

## 2016-02-28 NOTE — Plan of Care (Addendum)
Patient admitted few hrs ago, seen and stable. Admitted with GIB likely lower, BUN stable, subacute to chronic.  Giving 2 units total PRBC, for now IV PPI, monitor H&H, tagged RBC pending, Dr [name] GI called.  DW Dr [name] his DES to RCA is 89 mths old, OK to hold Plavix.  Addendum - Tagged RBC +ve for S.Bowel bleed, DW Dr [name], call IR for embolization. DW Dr Willeen Cass IR, bleeding very scant, observe per him.

## 2016-02-29 ENCOUNTER — Encounter (HOSPITAL_COMMUNITY)
Admission: EM | Disposition: A | Discharge status: Home / Self Care | Payer: Self-pay | Source: Home / Self Care | Attending: Internal Medicine

## 2016-02-29 ENCOUNTER — Encounter (HOSPITAL_COMMUNITY): Payer: Self-pay | Admitting: *Deleted

## 2016-02-29 HISTORY — PX: COLONOSCOPY: SHX5424

## 2016-02-29 HISTORY — PX: GIVENS CAPSULE STUDY: SHX5432

## 2016-02-29 LAB — GLUCOSE, CAPILLARY
Glucose-Capillary: 103 mg/dL — ABNORMAL HIGH (ref 65–99)
Glucose-Capillary: 132 mg/dL — ABNORMAL HIGH (ref 65–99)
Glucose-Capillary: 133 mg/dL — ABNORMAL HIGH (ref 65–99)
Glucose-Capillary: 143 mg/dL — ABNORMAL HIGH (ref 65–99)
Glucose-Capillary: 165 mg/dL — ABNORMAL HIGH (ref 65–99)
Glucose-Capillary: 173 mg/dL — ABNORMAL HIGH (ref 65–99)

## 2016-02-29 LAB — PREPARE RBC (CROSSMATCH)

## 2016-02-29 LAB — HEMOGLOBIN AND HEMATOCRIT, BLOOD
HCT: 25.6 % — ABNORMAL LOW (ref 39.0–52.0)
HCT: 22.7 % — ABNORMAL LOW (ref 39.0–52.0)
HCT: 22.8 % — ABNORMAL LOW (ref 39.0–52.0)
Hemoglobin: 8.2 g/dL — ABNORMAL LOW (ref 13.0–17.0)
Hemoglobin: 7.3 g/dL — ABNORMAL LOW (ref 13.0–17.0)
Hemoglobin: 7.1 g/dL — ABNORMAL LOW (ref 13.0–17.0)

## 2016-02-29 SURGERY — IMAGING PROCEDURE, GI TRACT, INTRALUMINAL, VIA CAPSULE
Anesthesia: LOCAL

## 2016-02-29 SURGERY — COLONOSCOPY
Anesthesia: Moderate Sedation

## 2016-02-29 MED ORDER — IBUPROFEN 200 MG PO TABS
400.0000 mg | ORAL_TABLET | Freq: Once | ORAL | Status: AC
  Administered 2016-02-29: 400 mg via ORAL
  Filled 2016-02-29: qty 2

## 2016-02-29 MED ORDER — FENTANYL CITRATE (PF) 100 MCG/2ML IJ SOLN
INTRAMUSCULAR | Status: DC | PRN
  Administered 2016-02-29: 12.5 ug via INTRAVENOUS
  Administered 2016-02-29: 25 ug via INTRAVENOUS
  Administered 2016-02-29: 12.5 ug via INTRAVENOUS

## 2016-02-29 MED ORDER — MIDAZOLAM HCL 5 MG/5ML IJ SOLN
INTRAMUSCULAR | Status: DC | PRN
  Administered 2016-02-29 (×2): 1 mg via INTRAVENOUS
  Administered 2016-02-29: 2 mg via INTRAVENOUS
  Administered 2016-02-29: 1 mg via INTRAVENOUS

## 2016-02-29 MED ORDER — DEXTROSE 5 % IV SOLN
INTRAVENOUS | Status: DC
Start: 2016-02-29 — End: 2016-03-01
  Administered 2016-02-29: 18:00:00 via INTRAVENOUS

## 2016-02-29 MED ORDER — MIDAZOLAM HCL 5 MG/ML IJ SOLN
INTRAMUSCULAR | Status: AC
  Filled 2016-02-29: qty 2

## 2016-02-29 MED ORDER — DIPHENHYDRAMINE HCL 50 MG/ML IJ SOLN
INTRAMUSCULAR | Status: AC
  Filled 2016-02-29: qty 1

## 2016-02-29 MED ORDER — SODIUM CHLORIDE 0.9 % IV SOLN
Freq: Once | INTRAVENOUS | Status: AC
  Administered 2016-02-29: 22:00:00 via INTRAVENOUS

## 2016-02-29 MED ORDER — FENTANYL CITRATE (PF) 100 MCG/2ML IJ SOLN
INTRAMUSCULAR | Status: AC
  Filled 2016-02-29: qty 2

## 2016-02-29 NOTE — Op Note (Signed)
[facility] Patient Name: [name] Procedure Date: 02/29/2016 MRN: [id number] Attending MD: Carol Ada , MD Date of Birth: 1945-09-26 CSN: [id number] Age: 70 Admit Type: Inpatient Procedure:                Colonoscopy Indications:              Iron deficiency anemia Providers:                Carol Ada, MD, Elmer Ramp. Tilden Dome, RN, Elspeth Cho Tech., Technician Referring MD:              Medicines:                Fentanyl 50 micrograms IV, Midazolam 5 mg IV Complications:            No immediate complications. Estimated Blood Loss:     Estimated blood loss: none. Procedure:                Pre-Anesthesia Assessment:                           - Prior to the procedure, a History and Physical                            was performed, and patient medications and                            allergies were reviewed. The patient's tolerance of                            previous anesthesia was also reviewed. The risks                            and benefits of the procedure and the sedation                            options and risks were discussed with the patient.                            All questions were answered, and informed consent                            was obtained. Prior Anticoagulants: The patient has                            taken no previous anticoagulant or antiplatelet                            agents. ASA Grade Assessment: III - A patient with                            severe systemic disease. After reviewing the risks  and benefits, the patient was deemed in                            satisfactory condition to undergo the procedure.                           - Sedation was administered by an endoscopy nurse.                            The sedation level attained was moderate.                           After obtaining informed consent, the colonoscope                            was passed  under direct vision. Throughout the                            procedure, the patient's blood pressure, pulse, and                            oxygen saturations were monitored continuously. The                            [id number] scope was introduced through                            the anus and advanced to the 20 cm into the ileum.                            The colonoscopy was technically difficult and                            complex. Successful completion of the procedure was                            aided by applying abdominal pressure. The patient                            tolerated the procedure well. The quality of the                            bowel preparation was good. The terminal ileum,                            ileocecal valve, appendiceal orifice, and rectum                            were photographed. Scope In: 10:08:27 AM Scope Out: 10:51:14 AM Scope Withdrawal Time: 0 hours 35 minutes 39 seconds  Total Procedure Duration: 0 hours 42 minutes 47 seconds  Findings:      The ileum, 40 cm from the ileocecal valve contained a single small       angiodysplastic lesion without bleeding.  Coagulation for tissue       destruction using monopolar probe was successful. Estimated blood loss:       none.      Two medium-mouthed diverticula were found in the cecum.      The TI was intubated up to 40-50 cm from the ICV. Using a combination of       abdominal pressure, insertion and reduction of the colonoscope, and a 2       cm colonic dilation balloon as a make shift balloon enteroscope, deep       intubation was achieved. One possible AVM was identified and ablated       with APC. There was no evidence of any ulcerations, erosions,       inflammation, overt bleeding, or other vascular abnormalities. I cannot       discern if I reached the site of abnormality identified on the bleeding       scan. Impression:               - A single non-bleeding angiodysplastic  lesion in                            the ileum. Treated with a monopolar probe.                           - Diverticulosis in the cecum.                           - No specimens collected. Moderate Sedation:      Moderate (conscious) sedation was administered by the endoscopy nurse       and supervised by the endoscopist. The following parameters were       monitored: oxygen saturation, heart rate, blood pressure, and response       to care. Recommendation:           - Return patient to hospital ward for ongoing care.                           - Capsule endoscopy.                           - After completion of the capsule endoscopy, and if                            the patient's HGB is stable, the patient can be                            discharged home tomorrow.                           - Follow up in the office in 2 weeks.                           - NPO.                           - Continue present medications.                           -  No repeat colonoscopy due to age and the absence                            of colonic polyps. Procedure Code(s):        --- Professional ---                           [phone], Colonoscopy, flexible; with ablation of                            tumor(s), polyp(s), or other lesion(s) (includes                            pre- and post-dilation and guide wire passage, when                            performed) Diagnosis Code(s):        --- Professional ---                           K55.20, Angiodysplasia of colon without hemorrhage                           D50.9, Iron deficiency anemia, unspecified                           K57.30, Diverticulosis of large intestine without                            perforation or abscess without bleeding CPT copyright 2016 [employer]. All rights reserved. The codes documented in this report are preliminary and upon coder review may  be revised to meet current compliance requirements. Carol Ada, MD Carol Ada, MD 02/29/2016 11:18:57 AM This report has been signed electronically. Number of Addenda: 0

## 2016-02-29 NOTE — Progress Notes (Signed)
PROGRESS NOTE                                                                                                                                                                                                             Patient Demographics:    [name], is a 70 y.o. male, DOB - [birth year], EI:[id number]  Admit date - 02/27/2016   Admitting Physician [name], DO  Outpatient Primary MD for the patient is [name],[name], MD  LOS - 1  Chief Complaint  Patient presents with  . Nausea  . Shortness of Breath  . Fatigue       Brief Narrative    [name] is a 70 y.o. male with medical history significant of recent LGIB with BRBPR in April of this year, upper and lower EGD negative except for diverticulosis, CAD with stent to RCA last Sept, PVD with PTAs in October, HTN, DM2. Patient presents to the ED with c/o nausea, SOB, fatigue. Patient also has left sided chest discomfort that feels like "a spider web" symptoms onset 4 days ago after he was bitten by an orange colored tick. Had some redness around tick bite the day after but no rash since, no fever, no chills, no headache. Activity makes symptoms worse, rest makes them better.  Patient has had to stop and rest when mowing lawn which is atypical for him. Couldn't walk to house or car without becoming extremely short of breath.  ED Course: Work up in ED is significant for HGB of 6.2, guiac positive stool.  Patient states he has been having black colored stools but they have been formed, no BRBPR since April of this year. No abdominal pain. EDP has ordered 1 unit for transfusion, troponin negative.   Subjective:    [name] today has, No headache, No chest pain, No abdominal pain - No Nausea, No new weakness tingling or numbness, No Cough - SOB.     Assessment  & Plan :     1.Symptomatic upper GI bleed related blood loss anemia. Status post 2  units of packed RBC transfusion, H&H still borderline will continue to monitor and transfuse as needed, 2 units packed RBC on hold, tagged RBC scan positive for possible terminal ileal bleeding, seen by GI Dr. [name] and underwent colonoscopy on 02/29/2016 which showed a single potential bleeding  site with a angiodysplastic lesion in terminal ileum. Plan is to monitor H&H father 24 hours, do a capsule endoscopy today, if stable discharge tomorrow with EPI and outpatient GI follow-up.  2. DM type II. On SSI.  3. Essential hypertension. Resume home dose beta blocker.  4. Chest pressure at the time of ER visit due to symptomatic anemia, EKG suggestive of LVH phenomenon, completely symptom-free after transfusion, continue beta blocker and monitor.  5. History of CAD with DES to RCA 9 months ago. Discussed his case with his cardiologist Dr. [name]. Okay to hold Plavix due to GI bleed, continue aspirin and beta blocker for secondary prevention.  6. GERD. PPI.    Family Communication  :  daughter sleeping bedside  Code Status :  Full  Diet : clear liquid  Disposition Plan  : Main inpatient, if H&H stable discharge tomorrow per GI  Consults  : GI Dr. hung  Procedures  :    Colonoscopy on 02/29/2016 by Dr. [name] showing single angiodysplastic potential bleeder N-terminal EM  DVT Prophylaxis  :   SCDs    Lab Results  Component Value Date   PLT 293 02/28/2016    Inpatient Medications  Scheduled Meds: . [MAR Hold] aspirin EC  81 mg Oral Daily  . [MAR Hold] ferrous sulfate  325 mg Oral Q breakfast  . [MAR Hold] insulin aspart  0-9 Units Subcutaneous Q4H  . [MAR Hold] metoprolol succinate  50 mg Oral Daily  . polyethylene glycol-electrolytes  4,000 mL Oral Once   Continuous Infusions: . sodium chloride 50 mL/hr at 02/28/16 1345  . sodium chloride 20 mL/hr at 02/28/16 1711  . pantoprozole (PROTONIX) infusion 8 mg/hr (02/29/16 0213)   PRN Meds:.[facility] Hold]  HYDROcodone-homatropine  Antibiotics  :    Anti-infectives    None         Objective:   Filed Vitals:   02/29/16 1100 02/29/16 1105 02/29/16 1110 02/29/16 1120  BP: 123/61  124/62 145/62  Pulse: 63 61 63 63  Temp: 98 F (36.7 C)     TempSrc: Oral     Resp: 15 15 15 15   Height:      Weight:      SpO2: 99% 99% 98% 97%    Wt Readings from Last 3 Encounters:  02/28/16 89.812 kg (198 lb)  02/18/16 89.812 kg (198 lb)  11/23/15 88.451 kg (195 lb)     Intake/Output Summary (Last 24 hours) at 02/29/16 1134 Last data filed at 02/29/16 1055  Gross per 24 hour  Intake 1862.83 ml  Output      0 ml  Net 1862.83 ml     Physical Exam  Awake Alert, Oriented X 3, No new F.N deficits, Normal affect [place].AT,PERRAL Supple Neck,No JVD, No cervical lymphadenopathy appriciated.  Symmetrical Chest wall movement, Good air movement bilaterally, CTAB RRR,No Gallops,Rubs or new Murmurs, No Parasternal Heave +ve B.Sounds, Abd Soft, No tenderness, No organomegaly appriciated, No rebound - guarding or rigidity. No Cyanosis, Clubbing or edema, No new Rash or bruise       Data Review:    CBC  Recent Labs Lab 02/27/16 2329 02/28/16 0744 02/28/16 1803 02/28/16 2212 02/29/16 0155 02/29/16 0539  WBC 7.9 7.6  --   --   --   --   HGB 6.2* 6.9* 8.1* 8.1* 8.2* 7.3*  HCT 20.3* 22.0* 26.2* 26.5* 25.6* 22.7*  PLT 304 293  --   --   --   --   MCV 74.6* 75.9*  --   --   --   --  MCH 22.8* 23.8*  --   --   --   --   MCHC 30.5 31.4  --   --   --   --   RDW 19.3* 19.7*  --   --   --   --   LYMPHSABS 1.4  --   --   --   --   --   MONOABS 0.5  --   --   --   --   --   EOSABS 0.2  --   --   --   --   --   BASOSABS 0.1  --   --   --   --   --     Chemistries   Recent Labs Lab 02/27/16 2014 02/28/16 0744  NA 136 137  K 4.1 4.1  CL 105 105  CO2 24 27  GLUCOSE 153* 164*  BUN 19 18  CREATININE 1.12 1.08  CALCIUM 9.0 8.7*  AST 18  --   ALT 12*  --   ALKPHOS 42  --   BILITOT 0.5   --    ------------------------------------------------------------------------------------------------------------------ No results for input(s): CHOL, HDL, LDLCALC, TRIG, CHOLHDL, LDLDIRECT in the last 72 hours.  Lab Results  Component Value Date   HGBA1C 9.4 10/31/2015   ------------------------------------------------------------------------------------------------------------------ No results for input(s): TSH, T4TOTAL, T3FREE, THYROIDAB in the last 72 hours.  Invalid input(s): FREET3 ------------------------------------------------------------------------------------------------------------------ No results for input(s): VITAMINB12, FOLATE, FERRITIN, TIBC, IRON, RETICCTPCT in the last 72 hours.  Coagulation profile  Recent Labs Lab 02/28/16 1803  INR 1.12    No results for input(s): DDIMER in the last 72 hours.  Cardiac Enzymes No results for input(s): CKMB, TROPONINI, MYOGLOBIN in the last 168 hours.  Invalid input(s): CK ------------------------------------------------------------------------------------------------------------------    Component Value Date/Time   BNP 21.0 02/27/2016 2013    Micro Results No results found for this or any previous visit (from the past 240 hour(s)).  Radiology Reports Dg Chest 2 View  02/27/2016  CLINICAL DATA:  70 year old male with shortness of breath EXAM: CHEST  2 VIEW COMPARISON:  Chest radiograph dated 08/25/2010 FINDINGS: Two views of the chest demonstrate clear lungs. There is no pleural effusion or pneumothorax the cardiac silhouette is within normal limits. The aorta is tortuous. No acute osseous pathology. IMPRESSION: No active cardiopulmonary disease. Electronically Signed   By: Anner Crete M.D.   On: 02/27/2016 21:31   Nm Gi Blood Loss  02/28/2016  CLINICAL DATA:  Anemia, black stools. History of bright red blood per rectum and the pleural. EXAM: NUCLEAR MEDICINE GASTROINTESTINAL BLEEDING SCAN TECHNIQUE: Sequential  abdominal images were obtained following intravenous administration of Tc-2m labeled red blood cells. RADIOPHARMACEUTICALS:  25.2 mCi Tc-5m in-vitro labeled red cells. COMPARISON:  CT 09/27/2004 FINDINGS: Anterior planar imaging of the abdomen pelvis was obtained for 2 hours. During the second hour of imaging, focus of radiotracer accumulates in the RIGHT lower quadrant. The pattern of accumulation is tubular and serpiginous with a relatively narrow diameter. Findings are consistent with active gastrointestinal bleeding and favor small bowel of the RIGHT lower quadrant. IMPRESSION: Small volume active gastrointestinal bleeding within the small bowel localizing to the RIGHT lower quadrant. These results will be called to the ordering clinician or representative by the Radiologist Assistant, and communication documented in the PACS or zVision Dashboard. Electronically Signed   By: Suzy Bouchard M.D.   On: 02/28/2016 13:24    Time Spent in minutes  30   Kyrah Schiro K M.D on 02/29/2016 at 11:34  AM  Between 7am to 7pm - Pager - [phone]  After 7pm go to [email or web address] - password [facility]  [facility] -  Office  [phone]

## 2016-02-29 NOTE — Interval H&P Note (Signed)
History and Physical Interval Note:  02/29/2016 9:44 AM  [name]  has presented today for surgery, with the diagnosis of GI bleed  The various methods of treatment have been discussed with the patient and family. After consideration of risks, benefits and other options for treatment, the patient has consented to  Procedure(s): COLONOSCOPY (Left) as a surgical intervention .  The patient's history has been reviewed, patient examined, no change in status, stable for surgery.  I have reviewed the patient's chart and labs.  Questions were answered to the patient's satisfaction.     Byrd Terrero D

## 2016-02-29 NOTE — H&P (View-Only) (Signed)
Reason for Consult: Anemia Referring Physician: [facility]  [name] HPI: This is a 70 year old male with a PMH of IDA s/p EGD/Colonoscopies on 12/16/2011 and 11/23/2015, CAD, PVD, s/p stenting, HTN, and hyperlipidemia admitted for complaints of symptomatic anemia.  His symptoms were noticeable when he was mowing his lawn and he was experiencing nausea, SOB, and fatigue.  In the ER he was identified to have an HGB of 6.9 g/dL and he reports melena over the past several months.  The most recent EGD/Colonoscopy was only significant for right sided diverticula.  In March 2016 his HGB was in the 14 range.  The original intent for the work up of his anemia in 2013 was to pursue a capsule endoscopy after the negative EGD/Colonoscopy in 2013, but his anemia resolved and remained stable for several years.  During this hospitalization the bleeding scan suggests a small bowel bleeding site in the RLQ.   Past Medical History  Diagnosis Date  . Coronary artery disease   . Hypertension   . Hyperlipidemia   . PVD (peripheral vascular disease) ([place])   . Type II diabetes mellitus ([place])   . Chronic anemia   . Bleeding gastric ulcer 2000's  . History of blood transfusion 2000's    "related to bleeding gastric ulcer"  . GERD (gastroesophageal reflux disease)     Past Surgical History  Procedure Laterality Date  . Upper gi endoscopy  12/16/11    findings:  mild gastritis and duodenitis  . Peripheral vascular catheterization N/A 04/24/2015    Procedure: Lower Extremity Angiography;  Surgeon: [name], MD;  Location: [place] [facility];  Service: Cardiovascular;  Laterality: N/A;  . Excisional hemorrhoidectomy  1980's  . Cardiac catheterization N/A 04/24/2015    Procedure: Left Heart Cath and Coronary Angiography;  Surgeon: [name], MD;  Location: [place] [facility];  Service: Cardiovascular;  Laterality: N/A;  . Cardiac catheterization N/A 04/24/2015    Procedure: Coronary Stent Intervention;   Surgeon: [name], MD;  Location: [place] [facility];  Service: Cardiovascular;  Laterality: N/A;  Prox RCA  . Coronary angioplasty    . Peripheral vascular catheterization N/A 05/29/2015    Procedure: Lower Extremity Angiography;  Surgeon: [name], MD;  Location: [place] [facility];  Service: Cardiovascular;  Laterality: N/A;  . Peripheral vascular catheterization Left 05/29/2015    Procedure: Renal Angiography;  Surgeon: [name], MD;  Location: [place] [facility];  Service: Cardiovascular;  Laterality: Left;  . Cardiac catheterization Right 05/29/2015    Procedure: Angioplasty Perioneal and SFA;  Surgeon: [name], MD;  Location: [place] [facility];  Service: Cardiovascular;  Laterality: Right;  . Peripheral vascular catheterization Left 06/12/2015    Procedure: Peripheral Vascular Atherectomy;  Surgeon: [name], MD;  Location: [place] [facility];  Service: Cardiovascular;  Laterality: Left;  AT  . Peripheral vascular catheterization Left 06/12/2015    Procedure: Peripheral Vascular Intervention;  Surgeon: [name], MD;  Location: [place] [facility];  Service: Cardiovascular;  Laterality: Left;  PTA TP TRUNK  . Esophagogastroduodenoscopy (egd) with propofol N/A 11/23/2015    Procedure: ESOPHAGOGASTRODUODENOSCOPY (EGD) WITH PROPOFOL;  Surgeon: Carol Ada, MD;  Location: [facility];  Service: Endoscopy;  Laterality: N/A;  . Colonoscopy with propofol N/A 11/23/2015    Procedure: COLONOSCOPY WITH PROPOFOL;  Surgeon: Carol Ada, MD;  Location: [facility];  Service: Endoscopy;  Laterality: N/A;    Family History  Problem Relation Age of Onset  . CVA    .  Hypertension      Social History:  reports that he quit smoking about a year ago. His smoking use included Cigars and Cigarettes. He has a 54 pack-year smoking history. He has quit using smokeless tobacco. His smokeless tobacco use included Chew. He reports that he does not drink alcohol or use illicit drugs.  Allergies: No Known  Allergies  Medications:  Scheduled: . aspirin EC  81 mg Oral Daily  . ferrous sulfate  325 mg Oral Q breakfast  . insulin aspart  0-9 Units Subcutaneous Q4H  . metoprolol succinate  50 mg Oral Daily   Continuous: . sodium chloride 50 mL/hr at 02/28/16 1345  . pantoprozole (PROTONIX) infusion 8 mg/hr (02/28/16 0302)    Results for orders placed or performed during the hospital encounter of 02/27/16 (from the past 24 hour(s))  Urinalysis, Routine w reflex microscopic     Status: None   Collection Time: 02/27/16  5:00 PM  Result Value Ref Range   Color, Urine YELLOW YELLOW   APPearance CLEAR CLEAR   Specific Gravity, Urine 1.012 1.005 - 1.030   pH 5.0 5.0 - 8.0   Glucose, UA NEGATIVE NEGATIVE mg/dL   Hgb urine dipstick NEGATIVE NEGATIVE   Bilirubin Urine NEGATIVE NEGATIVE   Ketones, ur NEGATIVE NEGATIVE mg/dL   Protein, ur NEGATIVE NEGATIVE mg/dL   Nitrite NEGATIVE NEGATIVE   Leukocytes, UA NEGATIVE NEGATIVE  Brain natriuretic peptide     Status: None   Collection Time: 02/27/16  8:13 PM  Result Value Ref Range   B Natriuretic Peptide 21.0 0.0 - 100.0 pg/mL  Lipase, blood     Status: None   Collection Time: 02/27/16  8:14 PM  Result Value Ref Range   Lipase 45 11 - 51 U/L  Comprehensive metabolic panel     Status: Abnormal   Collection Time: 02/27/16  8:14 PM  Result Value Ref Range   Sodium 136 135 - 145 mmol/L   Potassium 4.1 3.5 - 5.1 mmol/L   Chloride 105 101 - 111 mmol/L   CO2 24 22 - 32 mmol/L   Glucose, Bld 153 (H) 65 - 99 mg/dL   BUN 19 6 - 20 mg/dL   Creatinine, Ser 1.12 0.61 - 1.24 mg/dL   Calcium 9.0 8.9 - 10.3 mg/dL   Total Protein 7.9 6.5 - 8.1 g/dL   Albumin 4.2 3.5 - 5.0 g/dL   AST 18 15 - 41 U/L   ALT 12 (L) 17 - 63 U/L   Alkaline Phosphatase 42 38 - 126 U/L   Total Bilirubin 0.5 0.3 - 1.2 mg/dL   GFR calc non Af Amer >60 >60 mL/min   GFR calc Af Amer >60 >60 mL/min   Anion gap 7 5 - 15  I-Stat Troponin, ED (not at [facility])     Status: None    Collection Time: 02/27/16  8:18 PM  Result Value Ref Range   Troponin i, poc 0.00 0.00 - 0.08 ng/mL   Comment 3          CBC with Differential/Platelet     Status: Abnormal   Collection Time: 02/27/16 11:29 PM  Result Value Ref Range   WBC 7.9 4.0 - 10.5 K/uL   RBC 2.72 (L) 4.22 - 5.81 MIL/uL   Hemoglobin 6.2 (LL) 13.0 - 17.0 g/dL   HCT 20.3 (L) 39.0 - 52.0 %   MCV 74.6 (L) 78.0 - 100.0 fL   MCH 22.8 (L) 26.0 - 34.0 pg   MCHC 30.5 30.0 -  36.0 g/dL   RDW 19.3 (H) 11.5 - 15.5 %   Platelets 304 150 - 400 K/uL   Neutrophils Relative % 73 %   Lymphocytes Relative 18 %   Monocytes Relative 6 %   Eosinophils Relative 2 %   Basophils Relative 1 %   Neutro Abs 5.7 1.7 - 7.7 K/uL   Lymphs Abs 1.4 0.7 - 4.0 K/uL   Monocytes Absolute 0.5 0.1 - 1.0 K/uL   Eosinophils Absolute 0.2 0.0 - 0.7 K/uL   Basophils Absolute 0.1 0.0 - 0.1 K/uL   RBC Morphology POLYCHROMASIA PRESENT    Smear Review LARGE PLATELETS PRESENT   POC occult blood, ED     Status: Abnormal   Collection Time: 02/28/16 12:22 AM  Result Value Ref Range   Fecal Occult Bld POSITIVE (A) NEGATIVE  Type and screen [place]     Status: None (Preliminary result)   Collection Time: 02/28/16 12:43 AM  Result Value Ref Range   ABO/RH(D) B POS    Antibody Screen NEG    Sample Expiration 03/02/2016    Unit Number [id number]    Blood Component Type RED CELLS,LR    Unit division 00    Status of Unit ISSUED    Transfusion Status OK TO TRANSFUSE    Crossmatch Result Compatible    Unit Number [id number]    Blood Component Type RED CELLS,LR    Unit division 00    Status of Unit ISSUED    Transfusion Status OK TO TRANSFUSE    Crossmatch Result Compatible    Unit Number [id number]    Blood Component Type RBC LR PHER1    Unit division 00    Status of Unit ALLOCATED    Transfusion Status OK TO TRANSFUSE    Crossmatch Result Compatible   Prepare RBC     Status: None   Collection Time: 02/28/16 12:49 AM   Result Value Ref Range   Order Confirmation ORDER PROCESSED BY BLOOD BANK   Glucose, capillary     Status: Abnormal   Collection Time: 02/28/16  4:07 AM  Result Value Ref Range   Glucose-Capillary 175 (H) 65 - 99 mg/dL  CBC     Status: Abnormal   Collection Time: 02/28/16  7:44 AM  Result Value Ref Range   WBC 7.6 4.0 - 10.5 K/uL   RBC 2.90 (L) 4.22 - 5.81 MIL/uL   Hemoglobin 6.9 (LL) 13.0 - 17.0 g/dL   HCT 22.0 (L) 39.0 - 52.0 %   MCV 75.9 (L) 78.0 - 100.0 fL   MCH 23.8 (L) 26.0 - 34.0 pg   MCHC 31.4 30.0 - 36.0 g/dL   RDW 19.7 (H) 11.5 - 15.5 %   Platelets 293 150 - 400 K/uL  Basic metabolic panel     Status: Abnormal   Collection Time: 02/28/16  7:44 AM  Result Value Ref Range   Sodium 137 135 - 145 mmol/L   Potassium 4.1 3.5 - 5.1 mmol/L   Chloride 105 101 - 111 mmol/L   CO2 27 22 - 32 mmol/L   Glucose, Bld 164 (H) 65 - 99 mg/dL   BUN 18 6 - 20 mg/dL   Creatinine, Ser 1.08 0.61 - 1.24 mg/dL   Calcium 8.7 (L) 8.9 - 10.3 mg/dL   GFR calc non Af Amer >60 >60 mL/min   GFR calc Af Amer >60 >60 mL/min   Anion gap 5 5 - 15  Glucose, capillary     Status: Abnormal  Collection Time: 02/28/16  7:51 AM  Result Value Ref Range   Glucose-Capillary 162 (H) 65 - 99 mg/dL  Prepare RBC     Status: None   Collection Time: 02/28/16  9:30 AM  Result Value Ref Range   Order Confirmation ORDER PROCESSED BY BLOOD BANK      Dg Chest 2 View  02/27/2016  CLINICAL DATA:  70 year old male with shortness of breath EXAM: CHEST  2 VIEW COMPARISON:  Chest radiograph dated 08/25/2010 FINDINGS: Two views of the chest demonstrate clear lungs. There is no pleural effusion or pneumothorax the cardiac silhouette is within normal limits. The aorta is tortuous. No acute osseous pathology. IMPRESSION: No active cardiopulmonary disease. Electronically Signed   By: Anner Crete M.D.   On: 02/27/2016 21:31   Nm Gi Blood Loss  02/28/2016  CLINICAL DATA:  Anemia, black stools. History of bright red blood  per rectum and the pleural. EXAM: NUCLEAR MEDICINE GASTROINTESTINAL BLEEDING SCAN TECHNIQUE: Sequential abdominal images were obtained following intravenous administration of Tc-74m labeled red blood cells. RADIOPHARMACEUTICALS:  25.2 mCi Tc-84m in-vitro labeled red cells. COMPARISON:  CT 09/27/2004 FINDINGS: Anterior planar imaging of the abdomen pelvis was obtained for 2 hours. During the second hour of imaging, focus of radiotracer accumulates in the RIGHT lower quadrant. The pattern of accumulation is tubular and serpiginous with a relatively narrow diameter. Findings are consistent with active gastrointestinal bleeding and favor small bowel of the RIGHT lower quadrant. IMPRESSION: Small volume active gastrointestinal bleeding within the small bowel localizing to the RIGHT lower quadrant. These results will be called to the ordering clinician or representative by the Radiologist Assistant, and communication documented in the PACS or zVision Dashboard. Electronically Signed   By: Suzy Bouchard M.D.   On: 02/28/2016 13:24    ROS:  As stated above in the HPI otherwise negative.  Blood pressure 133/44, pulse 79, temperature 98.6 F (37 C), temperature source Oral, resp. rate 16, height 5\' 8"  (1.727 m), weight 89.812 kg (198 lb), SpO2 100 %.    PE: Gen: NAD, Alert and Oriented HEENT:  [place]/AT, EOMI Neck: Supple, no LAD Lungs: CTA Bilaterally CV: RRR without M/G/R ABM: Soft, NTND, +BS Ext: No C/C/E  Assessment/Plan: 1) Small bowel bleeding source. 2) Anemia. 3) CAD/PVD.   It is difficult to discern the exact location of the bleeding site with the bleeding scan, but Radiology reports that it is in the TI.  I do not know what portion of the TI that the bleeding occurred.  I had a long discussion with the patient and his family and the plan is to pursue a repeat colonoscopy with deep TI intubation.  I do not know if I will be able to reach the site, but I was clear about this issue with the  patient.  If the procedure is not successful, I will order a capsule endoscopy.  Most likely he will benefit from a double balloon enteroscopy at [facility] if I cannot localize the bleeding site.  Plan: 1) Colonoscopy tomorrow. 2) Capsule endoscopy if colonoscopy is unsuccessful.  Rosabella Edgin D 02/28/2016, 3:33 PM

## 2016-03-01 DIAGNOSIS — D500 Iron deficiency anemia secondary to blood loss (chronic): Secondary | ICD-10-CM

## 2016-03-01 DIAGNOSIS — K921 Melena: Secondary | ICD-10-CM

## 2016-03-01 DIAGNOSIS — E119 Type 2 diabetes mellitus without complications: Secondary | ICD-10-CM

## 2016-03-01 LAB — GLUCOSE, CAPILLARY
Glucose-Capillary: 157 mg/dL — ABNORMAL HIGH (ref 65–99)
Glucose-Capillary: 150 mg/dL — ABNORMAL HIGH (ref 65–99)

## 2016-03-01 LAB — HEMOGLOBIN AND HEMATOCRIT, BLOOD
HCT: 25.2 % — ABNORMAL LOW (ref 39.0–52.0)
Hemoglobin: 8.1 g/dL — ABNORMAL LOW (ref 13.0–17.0)

## 2016-03-01 MED ORDER — FERROUS SULFATE 325 (65 FE) MG PO TABS: 325.0000 mg | ORAL_TABLET | Freq: Two times a day (BID) | ORAL | Status: DC

## 2016-03-01 MED ORDER — CLOPIDOGREL BISULFATE 75 MG PO TABS: 75.0000 mg | ORAL_TABLET | Freq: Every day | ORAL | Status: DC

## 2016-03-01 NOTE — Progress Notes (Signed)
Daily Rounding Note  03/01/2016, 8:18 AM  LOS: 2 days   SUBJECTIVE:   Chief complaint: weakness, dyspnea, black stool.   These are all improved.  Eating well.  No BM since the colon prep.  No abd pain, no nausea Wondering about his discharge.     OBJECTIVE:         Vital signs in last 24 hours:    Temp:  [97.8 F (36.6 C)-98.9 F (37.2 C)] 98.4 F (36.9 C) (07/15 0444) Pulse Rate:  [58-81] 65 (07/15 0444) Resp:  [6-20] 20 (07/15 0444) BP: (102-170)/(41-94) 152/79 mmHg (07/15 0444) SpO2:  [95 %-100 %] 100 % (07/15 0444) Weight:  [89.812 kg (198 lb)] 89.812 kg (198 lb) (07/14 1146) Last BM Date: 02/29/16 Filed Weights   02/28/16 0246 02/29/16 1140 02/29/16 1146  Weight: 89.812 kg (198 lb) 89.812 kg (198 lb) 89.812 kg (198 lb)   General: overweight, not ill looking   Heart: RRR Chest: clear but diminished.  No labored breathing or cough Abdomen: soft, NT, ND.  No mass or HSM.  Active BS  Extremities: no CCE Neuro/Psych:  Alert, oriented x 3.  No gross deficits  Intake/Output from previous day: 07/14 0701 - 07/15 0700 In: 2664.2 [P.O.:660; I.V.:1674.2; Blood:330] Out: -   Intake/Output this shift:    Lab Results:  Recent Labs  02/27/16 2329 02/28/16 0744  02/29/16 0539 02/29/16 1404 03/01/16 0457  WBC 7.9 7.6  --   --   --   --   HGB 6.2* 6.9*  < > 7.3* 7.1* 8.1*  HCT 20.3* 22.0*  < > 22.7* 22.8* 25.2*  PLT 304 293  --   --   --   --   < > = values in this interval not displayed. BMET  Recent Labs  02/27/16 2014 02/28/16 0744  NA 136 137  K 4.1 4.1  CL 105 105  CO2 24 27  GLUCOSE 153* 164*  BUN 19 18  CREATININE 1.12 1.08  CALCIUM 9.0 8.7*   LFT  Recent Labs  02/27/16 2014  PROT 7.9  ALBUMIN 4.2  AST 18  ALT 12*  ALKPHOS 42  BILITOT 0.5   PT/INR  Recent Labs  02/28/16 1803  LABPROT 14.1  INR 1.12   Hepatitis Panel No results for input(s): HEPBSAG, HCVAB, HEPAIGM,  HEPBIGM in the last 72 hours.  Studies/Results: Nm Gi Blood Loss  02/28/2016  CLINICAL DATA:  Anemia, black stools. History of bright red blood per rectum and the pleural. EXAM: NUCLEAR MEDICINE GASTROINTESTINAL BLEEDING SCAN TECHNIQUE: Sequential abdominal images were obtained following intravenous administration of Tc-24m labeled red blood cells. RADIOPHARMACEUTICALS:  25.2 mCi Tc-79m in-vitro labeled red cells. COMPARISON:  CT 09/27/2004 FINDINGS: Anterior planar imaging of the abdomen pelvis was obtained for 2 hours. During the second hour of imaging, focus of radiotracer accumulates in the RIGHT lower quadrant. The pattern of accumulation is tubular and serpiginous with a relatively narrow diameter. Findings are consistent with active gastrointestinal bleeding and favor small bowel of the RIGHT lower quadrant. IMPRESSION: Small volume active gastrointestinal bleeding within the small bowel localizing to the RIGHT lower quadrant. These results will be called to the ordering clinician or representative by the Radiologist Assistant, and communication documented in the PACS or zVision Dashboard. Electronically Signed   By: Suzy Bouchard M.D.   On: 02/28/2016 13:24   Scheduled Meds: . aspirin EC  81 mg Oral Daily  . ferrous sulfate  325 mg Oral Q breakfast  . insulin aspart  0-9 Units Subcutaneous Q4H  . metoprolol succinate  50 mg Oral Daily   Continuous Infusions: . dextrose 50 mL/hr at 02/29/16 1731  . pantoprozole (PROTONIX) infusion 8 mg/hr (02/29/16 2338)   PRN Meds:.HYDROcodone-homatropine   ASSESMENT:   *  Iron def anemia, FOBT +/black stools.  S/p PRBC x 3, last was early this AM, 3 hours before latest Hgb .  Hgb up 1 gram in last 16 hours.  7/14 colonoscopy with bicap to non-bleeding ileal AVM.  Cecal diverticulosis 11/2015 EGD: mild duodenitis.   Capsule endoscopy in progress.     PLAN   *  Read capsule study.  Pt can discharge before this is accomplished.  Per Dr  Hung:"After completion of the capsule endoscopy, if the patient's HGB is stable, the patient can be discharged home tomorrow.  Follow up with Dr [name] in office in 2 weeks" Should also have CBC mid to later next week to assure stabity/improvement.   *  ? Up the dose of po iron to BID?    *  Continue daily PPI, Omeprazole in use at home.      Azucena Freed  03/01/2016, 8:18 AM Pager: [phone]

## 2016-03-03 ENCOUNTER — Encounter (HOSPITAL_COMMUNITY): Payer: Self-pay | Admitting: Gastroenterology

## 2016-03-03 LAB — TYPE AND SCREEN
ABO/RH(D): B POS
Antibody Screen: NEGATIVE
Unit division: 0
Unit division: 0
Unit division: 0
Unit division: 0

## 2016-03-04 NOTE — Discharge Summary (Signed)
Physician Discharge Summary  [name] [id number] DOB: 24-Dec-1945 DOA: 02/27/2016  PCP: Ruben Reason, MD  Admit date: 02/27/2016 Discharge date: 03/01/2016  Admitted From: Home Disposition:  Home  Recommendations for Outpatient Follow-up:  1. Follow up with PCP in 1-2 weeks 2. Please obtain BMP/CBC in one week 3. Please follow up on the biopsy results/ capsule endoscopy with Dr [name].:    Discharge Condition:stable.  CODE STATUS:full  Diet recommendation: Heart Healthy / Carb Modified /  Brief/Interim Summary: [name] is a 70 y.o. male with medical history significant of recent LGIB with BRBPR in April of this year, upper and lower EGD negative except for diverticulosis, CAD with stent to RCA last Sept, PVD with PTAs in October, HTN, DM2. presents to the ED with c/o nausea, SOB, fatigue. Was found to be anemic with guiac positive stools.   Discharge Diagnoses:  Principal Problem:   Anemia due to chronic blood loss Active Problems:   DM2 (diabetes mellitus, type 2) (HCC)   Essential hypertension   Chest pain  .Symptomatic upper GI bleed related blood loss anemia. Status post 2 units of packed RBC transfusion, H&h stabke, tagged RBC scan positive for possible terminal ileal bleeding, seen by GI Dr. [name] and underwent colonoscopy on 02/29/2016 which showed a single potential bleeding site with a angiodysplastic lesion in terminal ileum. underwent capsule endoscopy, repeat H&H stable, please follow up with DR [name] for the capsule endoscopy results.   2. DM type II. On SSI.  3. Essential hypertension. Resume home dose beta blocker.  4. Chest pressure at the time of ER visit due to symptomatic anemia, EKG suggestive of LVH phenomenon, completely symptom-free after transfusion, continue beta blocker and monitor.  5. History of CAD with DES to RCA 9 months ago. Discussed his case with his cardiologist Dr. [name]. Okay to hold Plavix due to GI bleed, continue aspirin and  beta blocker for secondary prevention.  6. GERD. PPI.  Discharge Instructions  Discharge Instructions    Call MD for:  difficulty breathing, headache or visual disturbances    Complete by:  As directed      Call MD for:  extreme fatigue    Complete by:  As directed      Call MD for:  persistant dizziness or light-headedness    Complete by:  As directed      Call MD for:  persistant nausea and vomiting    Complete by:  As directed      Diet Carb Modified    Complete by:  As directed      Discharge instructions    Complete by:  As directed   Please follow up with PCP in 2 to 3 days and get a repeat CBC to check hemoglobin.  Please follow up with Dr [name] in 2 weeks as recommended.  Please follow up with Dr [name] after GI follow up  regarding , whether to resume  plavix . Currently you are advised to hold plavix for your GI bleed.            Medication List    TAKE these medications        aspirin 81 MG tablet  Take 1 tablet (81 mg total) by mouth daily.     clopidogrel 75 MG tablet  Commonly known as:  PLAVIX  Take 1 tablet (75 mg total) by mouth daily.     esomeprazole 40 MG packet  Commonly known as:  NEXIUM  Take 40 mg by mouth daily before  breakfast.     ferrous sulfate 325 (65 FE) MG tablet  Take 1 tablet (325 mg total) by mouth 2 (two) times daily with a meal.     glimepiride 4 MG tablet  Commonly known as:  AMARYL  Take 1 tablet (4 mg total) by mouth daily before breakfast.     HYDROcodone-homatropine 5-1.5 MG/5ML syrup  Commonly known as:  HYCODAN  Take 5 mLs by mouth every 6 (six) hours as needed.     lisinopril 10 MG tablet  Commonly known as:  PRINIVIL,ZESTRIL  Take 1 tablet (10 mg total) by mouth daily.     metoprolol succinate 50 MG 24 hr tablet  Commonly known as:  TOPROL-XL  Take 50 mg by mouth daily. Take with or immediately following a meal.     nitroGLYCERIN 0.4 MG/SPRAY spray  Commonly known as:  NITROLINGUAL  Place 1 spray under the  tongue every 5 (five) minutes x 3 doses as needed for chest pain.     triamcinolone cream 0.1 %  Commonly known as:  KENALOG  Apply 1 application topically 2 (two) times daily.           Follow-up Information    Call Beryle Beams, MD.   Specialty:  Gastroenterology   Why:  make appt for ~ 7/28.     Contact information:   [address] [place] [address] [phone]      No Known Allergies  Consultations:  Gastroenterology.   Procedures/Studies: Dg Chest 2 View  02/27/2016  CLINICAL DATA:  70 year old male with shortness of breath EXAM: CHEST  2 VIEW COMPARISON:  Chest radiograph dated 08/25/2010 FINDINGS: Two views of the chest demonstrate clear lungs. There is no pleural effusion or pneumothorax the cardiac silhouette is within normal limits. The aorta is tortuous. No acute osseous pathology. IMPRESSION: No active cardiopulmonary disease. Electronically Signed   By: Anner Crete M.D.   On: 02/27/2016 21:31   Nm Gi Blood Loss  02/28/2016  CLINICAL DATA:  Anemia, black stools. History of bright red blood per rectum and the pleural. EXAM: NUCLEAR MEDICINE GASTROINTESTINAL BLEEDING SCAN TECHNIQUE: Sequential abdominal images were obtained following intravenous administration of Tc-55m labeled red blood cells. RADIOPHARMACEUTICALS:  25.2 mCi Tc-60m in-vitro labeled red cells. COMPARISON:  CT 09/27/2004 FINDINGS: Anterior planar imaging of the abdomen pelvis was obtained for 2 hours. During the second hour of imaging, focus of radiotracer accumulates in the RIGHT lower quadrant. The pattern of accumulation is tubular and serpiginous with a relatively narrow diameter. Findings are consistent with active gastrointestinal bleeding and favor small bowel of the RIGHT lower quadrant. IMPRESSION: Small volume active gastrointestinal bleeding within the small bowel localizing to the RIGHT lower quadrant. These results will be called to the ordering clinician or  representative by the Radiologist Assistant, and communication documented in the PACS or zVision Dashboard. Electronically Signed   By: Suzy Bouchard M.D.   On: 02/28/2016 13:24     Subjective: No new complaints.   Discharge Exam: Filed Vitals:   02/29/16 2335 03/01/16 0444  BP: 136/69 152/79  Pulse: 73 65  Temp: 98.8 F (37.1 C) 98.4 F (36.9 C)  Resp: 20 20   Filed Vitals:   02/29/16 2117 02/29/16 2135 02/29/16 2335 03/01/16 0444  BP: 169/85 170/94 136/69 152/79  Pulse: 74 81 73 65  Temp: 98.9 F (37.2 C) 98.5 F (36.9 C) 98.8 F (37.1 C) 98.4 F (36.9 C)  TempSrc: Oral Oral Oral Oral  Resp: 18 20  20 20  Height:      Weight:      SpO2: 100% 98% 100% 100%    General: Pt is alert, awake, not in acute distress Cardiovascular: RRR, S1/S2 +, no rubs, no gallops Respiratory: CTA bilaterally, no wheezing, no rhonchi Abdominal: Soft, NT, ND, bowel sounds + Extremities: no edema, no cyanosis    The results of significant diagnostics from this hospitalization (including imaging, microbiology, ancillary and laboratory) are listed below for reference.     Microbiology: No results found for this or any previous visit (from the past 240 hour(s)).   Labs: BNP (last 3 results)  Recent Labs  02/27/16 2013  BNP [id number]   Basic Metabolic Panel:  Recent Labs Lab 02/27/16 2014 02/28/16 0744  NA 136 137  K 4.1 4.1  CL 105 105  CO2 24 27  GLUCOSE 153* 164*  BUN 19 18  CREATININE 1.12 1.08  CALCIUM 9.0 8.7*   Liver Function Tests:  Recent Labs Lab 02/27/16 2014  AST 18  ALT 12*  ALKPHOS 42  BILITOT 0.5  PROT 7.9  ALBUMIN 4.2    Recent Labs Lab 02/27/16 2014  LIPASE 45   No results for input(s): AMMONIA in the last 168 hours. CBC:  Recent Labs Lab 02/27/16 2329 02/28/16 0744  02/28/16 2212 02/29/16 0155 02/29/16 0539 02/29/16 1404 03/01/16 0457  WBC 7.9 7.6  --   --   --   --   --   --   NEUTROABS 5.7  --   --   --   --   --   --   --    HGB 6.2* 6.9*  < > 8.1* 8.2* 7.3* 7.1* 8.1*  HCT 20.3* 22.0*  < > 26.5* 25.6* 22.7* 22.8* 25.2*  MCV 74.6* 75.9*  --   --   --   --   --   --   PLT 304 293  --   --   --   --   --   --   < > = values in this interval not displayed. Cardiac Enzymes: No results for input(s): CKTOTAL, CKMB, CKMBINDEX, TROPONINI in the last 168 hours. BNP: Invalid input(s): POCBNP CBG:  Recent Labs Lab 02/29/16 1636 02/29/16 2031 03/01/16 03/01/16 0440 03/01/16 0732  GLUCAP 132* 165* 173* 157* 150*   D-Dimer No results for input(s): DDIMER in the last 72 hours. Hgb A1c No results for input(s): HGBA1C in the last 72 hours. Lipid Profile No results for input(s): CHOL, HDL, LDLCALC, TRIG, CHOLHDL, LDLDIRECT in the last 72 hours. Thyroid function studies No results for input(s): TSH, T4TOTAL, T3FREE, THYROIDAB in the last 72 hours.  Invalid input(s): FREET3 Anemia work up No results for input(s): VITAMINB12, FOLATE, FERRITIN, TIBC, IRON, RETICCTPCT in the last 72 hours. Urinalysis    Component Value Date/Time   COLORURINE YELLOW 02/27/2016 1700   APPEARANCEUR CLEAR 02/27/2016 1700   LABSPEC 1.012 02/27/2016 1700   PHURINE 5.0 02/27/2016 1700   GLUCOSEU NEGATIVE 02/27/2016 1700   HGBUR NEGATIVE 02/27/2016 1700   BILIRUBINUR NEGATIVE 02/27/2016 1700   BILIRUBINUR neg 10/17/2014 2042   KETONESUR NEGATIVE 02/27/2016 1700   PROTEINUR NEGATIVE 02/27/2016 1700   PROTEINUR neg 10/17/2014 2042   UROBILINOGEN 0.2 10/17/2014 2042   UROBILINOGEN 0.2 08/26/2010 0414   NITRITE NEGATIVE 02/27/2016 1700   NITRITE neg 10/17/2014 2042   LEUKOCYTESUR NEGATIVE 02/27/2016 1700   Sepsis Labs Invalid input(s): PROCALCITONIN,  WBC,  LACTICIDVEN Microbiology No results found for this or any  previous visit (from the past 240 hour(s)).   Time coordinating discharge: Over 30 minutes  SIGNED:   Hosie Poisson, MD  [facility] 03/04/2016, 10:58 PM Pager   If 7PM-7AM, please contact  night-coverage [email or web address] Password [email or web address]

## 2016-03-06 DIAGNOSIS — D500 Iron deficiency anemia secondary to blood loss (chronic): Secondary | ICD-10-CM | POA: Diagnosis not present

## 2016-05-14 DIAGNOSIS — Z79899 Other long term (current) drug therapy: Secondary | ICD-10-CM | POA: Diagnosis not present

## 2016-05-14 DIAGNOSIS — I739 Peripheral vascular disease, unspecified: Secondary | ICD-10-CM | POA: Diagnosis not present

## 2016-05-14 DIAGNOSIS — R938 Abnormal findings on diagnostic imaging of other specified body structures: Secondary | ICD-10-CM | POA: Diagnosis not present

## 2016-05-14 DIAGNOSIS — R933 Abnormal findings on diagnostic imaging of other parts of digestive tract: Secondary | ICD-10-CM | POA: Diagnosis not present

## 2016-05-14 DIAGNOSIS — Z87891 Personal history of nicotine dependence: Secondary | ICD-10-CM | POA: Diagnosis not present

## 2016-05-14 DIAGNOSIS — K2970 Gastritis, unspecified, without bleeding: Secondary | ICD-10-CM | POA: Diagnosis not present

## 2016-05-14 DIAGNOSIS — I2510 Atherosclerotic heart disease of native coronary artery without angina pectoris: Secondary | ICD-10-CM | POA: Diagnosis not present

## 2016-05-14 DIAGNOSIS — K219 Gastro-esophageal reflux disease without esophagitis: Secondary | ICD-10-CM | POA: Diagnosis not present

## 2016-05-14 DIAGNOSIS — I10 Essential (primary) hypertension: Secondary | ICD-10-CM | POA: Diagnosis not present

## 2016-05-14 DIAGNOSIS — E119 Type 2 diabetes mellitus without complications: Secondary | ICD-10-CM | POA: Diagnosis not present

## 2016-05-14 DIAGNOSIS — Z7982 Long term (current) use of aspirin: Secondary | ICD-10-CM | POA: Diagnosis not present

## 2016-09-16 ENCOUNTER — Ambulatory Visit (INDEPENDENT_AMBULATORY_CARE_PROVIDER_SITE_OTHER): Payer: Medicare Other | Admitting: Physician Assistant

## 2016-09-16 ENCOUNTER — Ambulatory Visit: Payer: Medicare Other

## 2016-09-16 VITALS — BP 150/92 | HR 73 | Temp 98.5°F | Resp 17 | Ht 68.0 in | Wt 199.0 lb

## 2016-09-16 DIAGNOSIS — I10 Essential (primary) hypertension: Secondary | ICD-10-CM

## 2016-09-16 DIAGNOSIS — E1151 Type 2 diabetes mellitus with diabetic peripheral angiopathy without gangrene: Secondary | ICD-10-CM

## 2016-09-16 DIAGNOSIS — Z23 Encounter for immunization: Secondary | ICD-10-CM | POA: Diagnosis not present

## 2016-09-16 DIAGNOSIS — I25119 Atherosclerotic heart disease of native coronary artery with unspecified angina pectoris: Secondary | ICD-10-CM | POA: Diagnosis not present

## 2016-09-16 DIAGNOSIS — Z8719 Personal history of other diseases of the digestive system: Secondary | ICD-10-CM

## 2016-09-16 DIAGNOSIS — D649 Anemia, unspecified: Secondary | ICD-10-CM | POA: Diagnosis not present

## 2016-09-16 DIAGNOSIS — Z8711 Personal history of peptic ulcer disease: Secondary | ICD-10-CM

## 2016-09-16 MED ORDER — GLIMEPIRIDE 4 MG PO TABS: 4.0000 mg | ORAL_TABLET | Freq: Every day | ORAL | 3 refills | Status: DC

## 2016-09-16 MED ORDER — METFORMIN HCL ER 500 MG PO TB24: 500.0000 mg | ORAL_TABLET | Freq: Every day | ORAL | 11 refills | Status: DC

## 2016-09-16 MED ORDER — ESOMEPRAZOLE MAGNESIUM 40 MG PO PACK: 40.0000 mg | PACK | Freq: Every day | ORAL | 5 refills | Status: DC

## 2016-09-16 MED ORDER — NITROGLYCERIN 0.4 MG/SPRAY TL SOLN: 1.0000 | 0 refills | Status: DC | PRN

## 2016-09-16 MED ORDER — LISINOPRIL 10 MG PO TABS: 10.0000 mg | ORAL_TABLET | Freq: Every day | ORAL | 11 refills | Status: DC

## 2016-09-16 MED ORDER — CLOPIDOGREL BISULFATE 75 MG PO TABS: 75.0000 mg | ORAL_TABLET | Freq: Every day | ORAL | Status: DC

## 2016-09-16 NOTE — Progress Notes (Signed)
[facility] [address] [place] [address] [phone]  Date:  09/16/2016   Name:  [name]   DOB:  [birth year]   MRN:  [id number]  PCP:  Ruben Reason, MD    History of Present Illness:  [name] is a 71 y.o. male patient who presents to [facility] for medication refill.  Chief Complaint  Patient presents with  . Medication Refill    Amaryl,lisinopril,metformin,nitroglycerin,prilosec,clopidogrel     Diabetes II: eats "mostly anything I want".  He is eating pies, and chocolate chips.  He is also eating vegetables--greens, cabbage, or squash.  Water intake is >64 oz.  Soda every now and then.  He has some itching in his legs.  Numbness in his left chest numbness.  He drinks an excessive amount of water due to thirst, according to wife.  He does not check his blood sugar.  Numbness in the left foot at times.  Patting foot helps.     CAD/HTN: cad sp stent intervention.  Cardiologist was Dr. [name], reports that he has not followed up with [facility].  He has been without the clopidogrel for 5 days.  Hx of cardiac stents in 2016. Patient was hospitalized 5 months ago for an unknown gastrointestinal hemorrhage, wht Hgb at 7.1.  After recommendation to procedure to explore cause of blood loss, he declined.  He has never returned for follow up.  He was advised to return to cardiology with when to restart the Plavix.  He never did.  He restarted after a few days.  Wife states that family has "resolved that he may die", due to his non-compliance.  He denies any current dizziness, sob, or palpitations.  He denies chest pains.  He has not needed to use the nitroglycerin for 6-8 months.    He takes medication erratically.  He states, "I'm not putting a bunch of pills in my mouth at one time".    Prilosec daily.  He states that the "cereal stopped the blood".   No black or blood stool.     Patient Active Problem List   Diagnosis Date Noted  . Anemia due to chronic blood  loss 02/28/2016  . Chest pain 02/28/2016  . Chest pain with high risk for cardiac etiology 04/22/2015  . Claudication in peripheral vascular disease ([place]) 04/22/2015  . Anemia, iron deficiency 10/17/2014  . Essential hypertension 10/17/2014  . DM2 (diabetes mellitus, type 2) ([place]) 04/08/2012  . PUD (peptic ulcer disease) 11/11/2011    Past Medical History:  Diagnosis Date  . Bleeding gastric ulcer 2000's  . Chronic anemia   . Coronary artery disease   . GERD (gastroesophageal reflux disease)   . History of blood transfusion 2000's   "related to bleeding gastric ulcer"  . Hyperlipidemia   . Hypertension   . PVD (peripheral vascular disease) ([place])   . Type II diabetes mellitus ([place])     Past Surgical History:  Procedure Laterality Date  . CARDIAC CATHETERIZATION N/A 04/24/2015   Procedure: Left Heart Cath and Coronary Angiography;  Surgeon: [name], MD;  Location: [place] [facility];  Service: Cardiovascular;  Laterality: N/A;  . CARDIAC CATHETERIZATION N/A 04/24/2015   Procedure: Coronary Stent Intervention;  Surgeon: [name], MD;  Location: [place] [facility];  Service: Cardiovascular;  Laterality: N/A;  Prox RCA  . CARDIAC CATHETERIZATION Right 05/29/2015   Procedure: Angioplasty Perioneal and SFA;  Surgeon: [name], MD;  Location: [place] [facility];  Service: Cardiovascular;  Laterality: Right;  . COLONOSCOPY Left 02/29/2016   Procedure: COLONOSCOPY;  Surgeon: Carol Ada, MD;  Location: [facility];  Service: Endoscopy;  Laterality: Left;  . COLONOSCOPY WITH PROPOFOL N/A 11/23/2015   Procedure: COLONOSCOPY WITH PROPOFOL;  Surgeon: Carol Ada, MD;  Location: [facility];  Service: Endoscopy;  Laterality: N/A;  . CORONARY ANGIOPLASTY    . ESOPHAGOGASTRODUODENOSCOPY (EGD) WITH PROPOFOL N/A 11/23/2015   Procedure: ESOPHAGOGASTRODUODENOSCOPY (EGD) WITH PROPOFOL;  Surgeon: Carol Ada, MD;  Location: [facility];  Service: Endoscopy;  Laterality: N/A;  . EXCISIONAL  HEMORRHOIDECTOMY  1980's  . GIVENS CAPSULE STUDY N/A 02/29/2016   Procedure: GIVENS CAPSULE STUDY;  Surgeon: Carol Ada, MD;  Location: [facility];  Service: Endoscopy;  Laterality: N/A;  . PERIPHERAL VASCULAR CATHETERIZATION N/A 04/24/2015   Procedure: Lower Extremity Angiography;  Surgeon: [name], MD;  Location: [place] [facility];  Service: Cardiovascular;  Laterality: N/A;  . PERIPHERAL VASCULAR CATHETERIZATION N/A 05/29/2015   Procedure: Lower Extremity Angiography;  Surgeon: [name], MD;  Location: [place] [facility];  Service: Cardiovascular;  Laterality: N/A;  . PERIPHERAL VASCULAR CATHETERIZATION Left 05/29/2015   Procedure: Renal Angiography;  Surgeon: [name], MD;  Location: [place] [facility];  Service: Cardiovascular;  Laterality: Left;  . PERIPHERAL VASCULAR CATHETERIZATION Left 06/12/2015   Procedure: Peripheral Vascular Atherectomy;  Surgeon: [name], MD;  Location: [place] [facility];  Service: Cardiovascular;  Laterality: Left;  AT  . PERIPHERAL VASCULAR CATHETERIZATION Left 06/12/2015   Procedure: Peripheral Vascular Intervention;  Surgeon: [name], MD;  Location: [place] [facility];  Service: Cardiovascular;  Laterality: Left;  PTA TP TRUNK  . UPPER GI ENDOSCOPY  12/16/11   findings:  mild gastritis and duodenitis    Social History  Substance Use Topics  . Smoking status: Former Smoker    Packs/day: 1.00    Years: 54.00    Types: Cigars, Cigarettes    Quit date: 02/16/2015  . Smokeless tobacco: Former Systems developer    Types: Chew     Comment: "chewed some when I was young"  . Alcohol use No     Comment: 04/24/2015 "no beer in 2 wks; ;no liquor in 3-4 weeks; I'm not a real heavy drinker" states he stopped drining 2 yrs ago    Family History  Problem Relation Age of Onset  . CVA    . Hypertension      No Known Allergies  Medication list has been reviewed and updated.  Current Outpatient Prescriptions on File Prior to Visit  Medication Sig Dispense Refill  .  aspirin 81 MG tablet Take 1 tablet (81 mg total) by mouth daily. 30 tablet 11  . clopidogrel (PLAVIX) 75 MG tablet Take 1 tablet (75 mg total) by mouth daily.    Marland Kitchen esomeprazole (NEXIUM) 40 MG packet Take 40 mg by mouth daily before breakfast. 30 each 5  . ferrous sulfate 325 (65 FE) MG tablet Take 1 tablet (325 mg total) by mouth 2 (two) times daily with a meal. 30 tablet 11  . glimepiride (AMARYL) 4 MG tablet Take 1 tablet (4 mg total) by mouth daily before breakfast. 30 tablet 3  . HYDROcodone-homatropine (HYCODAN) 5-1.5 MG/5ML syrup Take 5 mLs by mouth every 6 (six) hours as needed. (Patient taking differently: Take 5 mLs by mouth every 6 (six) hours as needed for cough. ) 120 mL 0  . lisinopril (PRINIVIL,ZESTRIL) 10 MG tablet Take 1 tablet (10 mg total) by mouth daily. 30 tablet 11  . metoprolol  succinate (TOPROL-XL) 50 MG 24 hr tablet Take 50 mg by mouth daily. Take with or immediately following a meal.    . nitroGLYCERIN (NITROLINGUAL) 0.4 MG/SPRAY spray Place 1 spray under the tongue every 5 (five) minutes x 3 doses as needed for chest pain.    Marland Kitchen triamcinolone cream (KENALOG) 0.1 % Apply 1 application topically 2 (two) times daily. 30 g 0   No current facility-administered medications on file prior to visit.     ROS ROS otherwise unremarkable unless listed above.   Physical Examination: BP (!) 158/88 (BP Location: Right Arm, Patient Position: Sitting, Cuff Size: Large)   Pulse 73   Temp 98.5 F (36.9 C) (Oral)   Resp 17   Ht '5\' 8"'  (1.727 m)   Wt 199 lb (90.3 kg)   SpO2 97%   BMI 30.26 kg/m  Ideal Body Weight: Weight in (lb) to have BMI = 25: 164.1  Physical Exam  Constitutional: He is oriented to person, place, and time. He appears well-developed and well-nourished. No distress.  HENT:  Head: Normocephalic and atraumatic.  Right Ear: External ear normal.  Left Ear: External ear normal.  Eyes: Conjunctivae and EOM are normal. Pupils are equal, round, and reactive to light.   Cardiovascular: Normal rate and regular rhythm.  Exam reveals no friction rub.   No murmur heard. Pulmonary/Chest: Effort normal. No respiratory distress. He has no wheezes. He has no rales.  Feet:  Right Foot:  Protective Sensation: 6 sites tested. 6 sites sensed.  Skin Integrity: Negative for ulcer or blister.  Left Foot:  Protective Sensation: 6 sites tested. 6 sites sensed.  Skin Integrity: Negative for ulcer or blister.  Neurological: He is alert and oriented to person, place, and time.  Skin: Skin is warm and dry. He is not diaphoretic.  Psychiatric: He has a normal mood and affect. His behavior is normal.    Assessment and Plan: Kailand Seda is a 71 y.o. male who is here today for cc of medication refill.  Advised to not go without the Plavix.  I am restarting him on this at this time.  Advised to check blood sugar daily, and when to look.    Referring to a cardiologist.   Advised to take medication as prescribed.   Flu vaccine given.    Type 2 diabetes mellitus with diabetic peripheral angiopathy without gangrene, without long-term current use of insulin (HCC) - Plan: CMP14+EGFR, Hemoglobin A1c, metFORMIN (GLUCOPHAGE-XR) 500 MG 24 hr tablet, glimepiride (AMARYL) 4 MG tablet  Need for prophylactic vaccination and inoculation against influenza - Plan: Flu Vaccine QUAD 36+ mos IM  Essential hypertension - Plan: CBC, CMP14+EGFR, Hemoglobin A1c, lisinopril (PRINIVIL,ZESTRIL) 10 MG tablet, Ambulatory referral to Cardiology, EKG 12-Lead  Coronary artery disease involving native heart with angina pectoris, unspecified vessel or lesion type ([place]) - Plan: CMP14+EGFR, Hemoglobin A1c, lisinopril (PRINIVIL,ZESTRIL) 10 MG tablet, nitroGLYCERIN (NITROLINGUAL) 0.4 MG/SPRAY spray, Ambulatory referral to Cardiology, EKG 12-Lead  Anemia, unspecified type - Plan: CBC, DISCONTINUED: clopidogrel (PLAVIX) 75 MG tablet  History of stomach ulcers - Plan: esomeprazole (NEXIUM) 40 MG  packet  Ivar Drape, PA-C [facility] [place] 2/2/20187:57 AM

## 2016-09-16 NOTE — Patient Instructions (Addendum)
  Follow up with lab results.   Please await contact for cardiologist. I need you to check your blood sugar every day.  Especially if you have any symptoms of dizziness, sweating, increased thirst.   Please do not go without plavix.     IF you received an x-ray today, you will receive an invoice from [facility]. Please contact [facility] at [phone] with questions or concerns regarding your invoice.   IF you received labwork today, you will receive an invoice from [facility]. Please contact [facility] at [phone] with questions or concerns regarding your invoice.   Our billing staff will not be able to assist you with questions regarding bills from these companies.  You will be contacted with the lab results as soon as they are available. The fastest way to get your results is to activate your My Chart account. Instructions are located on the last page of this paperwork. If you have not heard from [place] regarding the results in 2 weeks, please contact this office.

## 2016-09-17 ENCOUNTER — Telehealth: Payer: Self-pay

## 2016-09-17 DIAGNOSIS — D649 Anemia, unspecified: Secondary | ICD-10-CM

## 2016-09-17 LAB — CMP14+EGFR
ALT: 10 IU/L (ref 0–44)
AST: 14 IU/L (ref 0–40)
Albumin/Globulin Ratio: 1.2 (ref 1.2–2.2)
Albumin: 4.5 g/dL (ref 3.5–4.8)
Alkaline Phosphatase: 56 IU/L (ref 39–117)
BUN/Creatinine Ratio: 11 (ref 10–24)
BUN: 14 mg/dL (ref 8–27)
Bilirubin Total: 0.4 mg/dL (ref 0.0–1.2)
CO2: 21 mmol/L (ref 18–29)
Calcium: 10 mg/dL (ref 8.6–10.2)
Chloride: 97 mmol/L (ref 96–106)
Creatinine, Ser: 1.28 mg/dL — ABNORMAL HIGH (ref 0.76–1.27)
GFR calc Af Amer: 65 mL/min/{1.73_m2} (ref >59)
GFR calc non Af Amer: 56 mL/min/{1.73_m2} — ABNORMAL LOW (ref >59)
Globulin, Total: 3.8 g/dL (ref 1.5–4.5)
Glucose: 229 mg/dL — ABNORMAL HIGH (ref 65–99)
Potassium: 4.3 mmol/L (ref 3.5–5.2)
Sodium: 139 mmol/L (ref 134–144)
Total Protein: 8.3 g/dL (ref 6.0–8.5)

## 2016-09-17 LAB — HEMOGLOBIN A1C
Est. average glucose Bld gHb Est-mCnc: 246 mg/dL
Hgb A1c MFr Bld: 10.2 % — ABNORMAL HIGH (ref 4.8–5.6)

## 2016-09-17 LAB — CBC
Hematocrit: 32.5 % — ABNORMAL LOW (ref 37.5–51.0)
Hemoglobin: 9.3 g/dL — ABNORMAL LOW (ref 13.0–17.7)
MCH: 20 pg — ABNORMAL LOW (ref 26.6–33.0)
MCHC: 28.6 g/dL — ABNORMAL LOW (ref 31.5–35.7)
MCV: 70 fL — ABNORMAL LOW (ref 79–97)
Platelets: 377 10*3/uL (ref 150–379)
RBC: 4.64 x10E6/uL (ref 4.14–5.80)
RDW: 19.7 % — ABNORMAL HIGH (ref 12.3–15.4)
WBC: 7.2 10*3/uL (ref 3.4–10.8)

## 2016-09-17 MED ORDER — CLOPIDOGREL BISULFATE 75 MG PO TABS: 75.0000 mg | ORAL_TABLET | Freq: Every day | ORAL | 1 refills | Status: DC

## 2016-09-17 NOTE — Telephone Encounter (Signed)
PT WAS SEEN YESTERDAY BY ENGLISH AND STATES THAT THE PHARMACY STATES THAT THE PLAVIX WAS NOT RECEIVED

## 2016-09-17 NOTE — Telephone Encounter (Signed)
resent

## 2016-09-25 ENCOUNTER — Telehealth: Payer: Self-pay

## 2016-09-25 NOTE — Telephone Encounter (Signed)
[name] is calling about test strips for this patient from [place] and that they need to verify the rx  Best number pt [phone]  Pharmacy [phone]

## 2016-09-25 NOTE — Telephone Encounter (Signed)
Needs new rx for test strips   Bayer contour next  Test how often?  Dx. Code  Amount and refills  For medicare.  They said the rx they got was hand written by stephanie

## 2016-09-26 MED ORDER — GLUCOSE BLOOD VI STRP: ORAL_STRIP | 5 refills | Status: DC

## 2016-09-26 NOTE — Telephone Encounter (Signed)
rx test strips He did not know the apparatus initially. Prescription placed

## 2016-09-26 NOTE — Telephone Encounter (Signed)
Left message that test strips have been sent.

## 2016-09-29 ENCOUNTER — Encounter: Payer: Self-pay | Admitting: Cardiovascular Disease

## 2016-09-30 ENCOUNTER — Ambulatory Visit (INDEPENDENT_AMBULATORY_CARE_PROVIDER_SITE_OTHER): Payer: Medicare Other | Admitting: Cardiovascular Disease

## 2016-09-30 ENCOUNTER — Encounter: Payer: Self-pay | Admitting: Cardiovascular Disease

## 2016-09-30 VITALS — BP 174/82 | HR 76 | Ht 68.0 in | Wt 202.2 lb

## 2016-09-30 DIAGNOSIS — I739 Peripheral vascular disease, unspecified: Secondary | ICD-10-CM | POA: Diagnosis not present

## 2016-09-30 DIAGNOSIS — I25119 Atherosclerotic heart disease of native coronary artery with unspecified angina pectoris: Secondary | ICD-10-CM | POA: Diagnosis not present

## 2016-09-30 DIAGNOSIS — Z955 Presence of coronary angioplasty implant and graft: Secondary | ICD-10-CM

## 2016-09-30 DIAGNOSIS — E7800 Pure hypercholesterolemia, unspecified: Secondary | ICD-10-CM

## 2016-09-30 DIAGNOSIS — I209 Angina pectoris, unspecified: Secondary | ICD-10-CM | POA: Diagnosis not present

## 2016-09-30 DIAGNOSIS — Z91199 Patient's noncompliance with other medical treatment and regimen due to unspecified reason: Secondary | ICD-10-CM

## 2016-09-30 DIAGNOSIS — I10 Essential (primary) hypertension: Secondary | ICD-10-CM

## 2016-09-30 DIAGNOSIS — Z9119 Patient's noncompliance with other medical treatment and regimen: Secondary | ICD-10-CM

## 2016-09-30 MED ORDER — CARVEDILOL 12.5 MG PO TABS: 12.5000 mg | ORAL_TABLET | Freq: Two times a day (BID) | ORAL | 5 refills | Status: DC

## 2016-09-30 MED ORDER — LISINOPRIL 20 MG PO TABS: 20.0000 mg | ORAL_TABLET | Freq: Every day | ORAL | 5 refills | Status: DC

## 2016-09-30 NOTE — Progress Notes (Signed)
Cardiology Office Note   Date:  10/03/2016   ID:  [name], DOB Oct 06, 1945, MRN [id number]  PCP:  Ruben Reason, MD  Cardiologist:   Skeet Latch, MD   Chief Complaint  Patient presents with  . New Patient (Initial Visit)    Pt states no SX.      History of Present Illness: [name] is a 71 y.o. male with CAD s/p PCI, PAD s/p peripheral stenting, hypertension, poorly-controlled diabetes and non-compliance who presents to establish care.  He was hospitalized 02/2016 with melena.   His hemoglobin was 7.1 at the time.  He declined work up and restarted Plavix on his own.  He underwent upper and lower endoscopy and was found to have diverticulosis but no source of bleeding.  No bleeding was found on capsule endoscopy at [facility].  He reports that an exploratory surgery was recommended but he declined.  He has decided to treat himself with increased fiber instead.  He ultimately restarted Plavix and denies recurrent bleeding.  Per noted with his PCP on 09/16/16 he admitted to missing several doses of clopidogrel.  However today he reports that he "never misses meds."   Per a letter from his daughter and talking with his wife he is very noncompliant with all medications. His most recent hemoblobin A1c is 10.2%.  [name] was previously a patient of Dr. [name]. He was instructed to follow up with Dr. [name] prior to restarting Plavix but never did.    [name] has a history of LAD stenting in 2016.  He reports episodes of left chest numbness but no pain.  These episodes typically occur at rest (while watching TV) and last for 1-2 hours.  There is no associated shortness of breath an dit never occurs with exertion.  However, he doesn't get much regular exercise other than walking.  He does note shortness of breath  With exertion.  He also notes rare episodes of palpitations that are associated with lightheadedness.  This occurs both at rest and with exertion.  He denies lower extremity  edema, orthopnea or PND.   Past Medical History:  Diagnosis Date  . Bleeding gastric ulcer 2000's  . Chronic anemia   . Coronary artery disease   . GERD (gastroesophageal reflux disease)   . History of blood transfusion 2000's   "related to bleeding gastric ulcer"  . Hyperlipidemia   . Hypertension   . PVD (peripheral vascular disease) ([place])   . S/P coronary artery stent placement 10/03/2016  . Type II diabetes mellitus ([name])     Past Surgical History:  Procedure Laterality Date  . CARDIAC CATHETERIZATION N/A 04/24/2015   Procedure: Left Heart Cath and Coronary Angiography;  Surgeon: [name], MD;  Location: [place] [facility];  Service: Cardiovascular;  Laterality: N/A;  . CARDIAC CATHETERIZATION N/A 04/24/2015   Procedure: Coronary Stent Intervention;  Surgeon: [name], MD;  Location: [place] [facility];  Service: Cardiovascular;  Laterality: N/A;  Prox RCA  . CARDIAC CATHETERIZATION Right 05/29/2015   Procedure: Angioplasty Perioneal and SFA;  Surgeon: [name], MD;  Location: [place] [facility];  Service: Cardiovascular;  Laterality: Right;  . COLONOSCOPY Left 02/29/2016   Procedure: COLONOSCOPY;  Surgeon: Carol Ada, MD;  Location: [facility];  Service: Endoscopy;  Laterality: Left;  . COLONOSCOPY WITH PROPOFOL N/A 11/23/2015   Procedure: COLONOSCOPY WITH PROPOFOL;  Surgeon: Carol Ada, MD;  Location: [facility];  Service: Endoscopy;  Laterality: N/A;  . CORONARY ANGIOPLASTY    .  ESOPHAGOGASTRODUODENOSCOPY (EGD) WITH PROPOFOL N/A 11/23/2015   Procedure: ESOPHAGOGASTRODUODENOSCOPY (EGD) WITH PROPOFOL;  Surgeon: Carol Ada, MD;  Location: [facility];  Service: Endoscopy;  Laterality: N/A;  . EXCISIONAL HEMORRHOIDECTOMY  1980's  . GIVENS CAPSULE STUDY N/A 02/29/2016   Procedure: GIVENS CAPSULE STUDY;  Surgeon: Carol Ada, MD;  Location: [facility];  Service: Endoscopy;  Laterality: N/A;  . PERIPHERAL VASCULAR CATHETERIZATION N/A 04/24/2015   Procedure: Lower Extremity  Angiography;  Surgeon: [name], MD;  Location: [place] [facility];  Service: Cardiovascular;  Laterality: N/A;  . PERIPHERAL VASCULAR CATHETERIZATION N/A 05/29/2015   Procedure: Lower Extremity Angiography;  Surgeon: [name], MD;  Location: [place] [facility];  Service: Cardiovascular;  Laterality: N/A;  . PERIPHERAL VASCULAR CATHETERIZATION Left 05/29/2015   Procedure: Renal Angiography;  Surgeon: [name], MD;  Location: [place] [facility];  Service: Cardiovascular;  Laterality: Left;  . PERIPHERAL VASCULAR CATHETERIZATION Left 06/12/2015   Procedure: Peripheral Vascular Atherectomy;  Surgeon: [name], MD;  Location: [place] [facility];  Service: Cardiovascular;  Laterality: Left;  AT  . PERIPHERAL VASCULAR CATHETERIZATION Left 06/12/2015   Procedure: Peripheral Vascular Intervention;  Surgeon: [name], MD;  Location: [place] [facility];  Service: Cardiovascular;  Laterality: Left;  PTA TP TRUNK  . UPPER GI ENDOSCOPY  12/16/11   findings:  mild gastritis and duodenitis     Current Outpatient Prescriptions  Medication Sig Dispense Refill  . aspirin 81 MG tablet Take 1 tablet (81 mg total) by mouth daily. 30 tablet 11  . clopidogrel (PLAVIX) 75 MG tablet Take 1 tablet (75 mg total) by mouth daily. 30 tablet 1  . esomeprazole (NEXIUM) 40 MG packet Take 40 mg by mouth daily before breakfast. 30 each 5  . ferrous sulfate 325 (65 FE) MG tablet Take 1 tablet (325 mg total) by mouth 2 (two) times daily with a meal. 30 tablet 11  . glimepiride (AMARYL) 4 MG tablet Take 1 tablet (4 mg total) by mouth daily before breakfast. 30 tablet 3  . glucose blood (BAYER CONTOUR NEXT TEST) test strip test three times per day 100 each 5  . lisinopril (PRINIVIL,ZESTRIL) 20 MG tablet Take 1 tablet (20 mg total) by mouth daily. 30 tablet 5  . metFORMIN (GLUCOPHAGE-XR) 500 MG 24 hr tablet Take 1 tablet (500 mg total) by mouth daily with breakfast. 30 tablet 11  . nitroGLYCERIN (NITROLINGUAL) 0.4 MG/SPRAY  spray Place 1 spray under the tongue every 5 (five) minutes x 3 doses as needed for chest pain. 12 g 0  . triamcinolone cream (KENALOG) 0.1 % Apply 1 application topically 2 (two) times daily. 30 g 0  . carvedilol (COREG) 12.5 MG tablet Take 1 tablet (12.5 mg total) by mouth 2 (two) times daily. 60 tablet 5   No current facility-administered medications for this visit.     Allergies:   Patient has no known allergies.    Social History:  The patient  reports that he quit smoking about 19 months ago. His smoking use included Cigars and Cigarettes. He has a 54.00 pack-year smoking history. He has quit using smokeless tobacco. His smokeless tobacco use included Chew. He reports that he does not drink alcohol or use drugs.   Family History:  The patient's family history includes Alcohol abuse in his father; Diabetes in his mother; Heart attack in his father; Hypertension in his father; Peripheral Artery Disease in his father.    ROS:  Please see the history of present illness.  Otherwise, review of systems are positive for none.   All other systems are reviewed and negative.    PHYSICAL EXAM: VS:  BP (!) 174/82   Pulse 76   Ht 5\' 8"  (1.727 m)   Wt 91.7 kg (202 lb 3.2 oz)   BMI 30.74 kg/m  , BMI Body mass index is 30.74 kg/m. GENERAL:  Well appearing HEENT:  Pupils equal round and reactive, fundi not visualized, oral mucosa unremarkable NECK:  No jugular venous distention, waveform within normal limits, carotid upstroke brisk and symmetric, no bruits, no thyromegaly LYMPHATICS:  No cervical adenopathy LUNGS:  Clear to auscultation bilaterally HEART:  RRR.  PMI not displaced or sustained,S1 and S2 within normal limits, no S3, no S4, no clicks, no rubs, no murmurs ABD:  Flat, positive bowel sounds normal in frequency in pitch, no bruits, no rebound, no guarding, no midline pulsatile mass, no hepatomegaly, no splenomegaly EXT:  2 plus pulses throughout, no edema, no cyanosis no  clubbing SKIN:  No rashes no nodules NEURO:  Cranial nerves II through XII grossly intact, motor grossly intact throughout PSYCH:  Cognitively intact, oriented to person place and time   EKG:  EKG is not ordered today.   Recent Labs: 02/27/2016: B Natriuretic Peptide 21.0 03/01/2016: Hemoglobin 8.1 09/16/2016: ALT 10; BUN 14; Creatinine, Ser 1.28; Platelets 377; Potassium 4.3; Sodium 139    Lipid Panel    Component Value Date/Time   CHOL 151 10/31/2015 1559   TRIG 136 10/31/2015 1559   HDL 41 10/31/2015 1559   CHOLHDL 3.7 10/31/2015 1559   VLDL 27 10/31/2015 1559   LDLCALC 83 10/31/2015 1559      Wt Readings from Last 3 Encounters:  09/30/16 91.7 kg (202 lb 3.2 oz)  09/16/16 90.3 kg (199 lb)  02/29/16 89.8 kg (198 lb)      ASSESSMENT AND PLAN:  # Atypical chest pain:  Mr. Trump symptoms are atypical.  However he doesn't exercise enough to know if his symptoms are exertional. We will get a Lexiscan Myoview to assess.  Continue aspirin and clopidogrel.  PCI was in 2016.  Clopidogrel can be stopped if he develops recurrent bleeding.   # Hypertension:  Blood pressure is poorly-controlled. Repeat was 180/84.  We will stop metoprolol and start carvedilol 12.5 mg twice daily.  Increase lisionpril to 20 mg dailyHe reports taking his BP meds, but his wife and daughter note that he is very noncompliant.  # Leg numbness: # PAD: Check ABIs.  Continue aspirin, clopidogrel.  After his visit it was noted that he is not on a statin.  LDL 83 10/2015.  Will address at follow up.    CBlood pressure is poorly-controlled we will increaseon:urrent medicines are reviewed at length with the patient today.  The patient does not have concerns regarding medicines.  The following changes have been made:  Increase lisinopril to 20 mg daily.  Start carvedilol and stop metoprolol.  Labs/ tests ordered today include:   Orders Placed This Encounter  Procedures  . Myocardial Perfusion Imaging      Disposition:   FU with Loriene Taunton C. Oval Linsey, MD, [facility] in 1 month    This note was written with the assistance of speech recognition software.  Please excuse any transcriptional errors.  Signed, Chanel Mcadams C. Oval Linsey, MD, [facility]  10/03/2016 4:17 PM    [facility]

## 2016-09-30 NOTE — Patient Instructions (Addendum)
Medication Instructions:  STOP METOPROLOL   START CARVEDILOL 12.5 MG TWICE A DAY   INCREASE YOUR LISINOPRIL TO 20 MG DAILY (TAKE 2 OF THE 10 MG TABLETS UNTIL YOU FINISH CURRENT BOTTLE, NEW RX SENT TO PHARMACY)*  Labwork: NONE  Testing/Procedures: Your physician has requested that you have an ankle brachial index (ABI). During this test an ultrasound and blood pressure cuff are used to evaluate the arteries that supply the arms and legs with blood. Allow thirty minutes for this exam. There are no restrictions or special instructions.  Your physician has requested that you have a lexiscan myoview. For further information please visit [email or web address]. Please follow instruction sheet, as given.  Follow-Up: Your physician recommends that you schedule a follow-up appointment in: [place]  If you need a refill on your cardiac medications before your next appointment, please call your pharmacy.

## 2016-10-03 ENCOUNTER — Encounter: Payer: Self-pay | Admitting: Cardiovascular Disease

## 2016-10-03 DIAGNOSIS — I2510 Atherosclerotic heart disease of native coronary artery without angina pectoris: Secondary | ICD-10-CM | POA: Insufficient documentation

## 2016-10-03 DIAGNOSIS — Z955 Presence of coronary angioplasty implant and graft: Secondary | ICD-10-CM

## 2016-10-03 DIAGNOSIS — Z9861 Coronary angioplasty status: Secondary | ICD-10-CM

## 2016-10-03 HISTORY — DX: Presence of coronary angioplasty implant and graft: Z95.5

## 2016-10-10 ENCOUNTER — Other Ambulatory Visit: Payer: Self-pay | Admitting: Cardiovascular Disease

## 2016-10-10 DIAGNOSIS — I739 Peripheral vascular disease, unspecified: Secondary | ICD-10-CM

## 2016-10-13 ENCOUNTER — Other Ambulatory Visit: Payer: Self-pay

## 2016-10-13 MED ORDER — UNABLE TO FIND: 2 refills | Status: DC

## 2016-10-13 NOTE — Telephone Encounter (Signed)
Called to [facility] 

## 2016-10-15 ENCOUNTER — Other Ambulatory Visit: Payer: Self-pay

## 2016-10-15 MED ORDER — GLUCOSE BLOOD VI STRP: ORAL_STRIP | 5 refills | Status: DC

## 2016-10-15 MED ORDER — ONETOUCH ULTRA SYSTEM W/DEVICE KIT: 1.0000 | PACK | Freq: Once | 0 refills | Status: DC

## 2016-10-15 NOTE — Telephone Encounter (Signed)
RX for diabetic supplies unable to be filled due to lack of information.   Sent-printed/faxed to [facility]  One Touch Kit including meter, supplies  Diagnosis code: E 11.51    DM Pt to check blood glucose 3 (three) times daily

## 2016-10-16 ENCOUNTER — Telehealth (HOSPITAL_COMMUNITY): Payer: Self-pay

## 2016-10-16 NOTE — Telephone Encounter (Signed)
Encounter complete. 

## 2016-10-20 MED ORDER — GLUCOSE BLOOD VI STRP: ORAL_STRIP | 5 refills | Status: DC

## 2016-10-20 NOTE — Addendum Note (Signed)
Addended by: Virgia Land on: 10/20/2016 08:59 AM   Modules accepted: Orders

## 2016-10-21 ENCOUNTER — Ambulatory Visit (HOSPITAL_COMMUNITY)
Admission: RE | Admit: 2016-10-21 | Discharge: 2016-10-21 | Disposition: A | Discharge status: Home / Self Care | Payer: Medicare Other | Source: Ambulatory Visit | Attending: Cardiovascular Disease | Admitting: Cardiovascular Disease

## 2016-10-21 ENCOUNTER — Telehealth: Payer: Self-pay | Admitting: General Practice

## 2016-10-21 ENCOUNTER — Encounter (HOSPITAL_COMMUNITY): Payer: Self-pay

## 2016-10-21 DIAGNOSIS — I70203 Unspecified atherosclerosis of native arteries of extremities, bilateral legs: Secondary | ICD-10-CM | POA: Diagnosis not present

## 2016-10-21 DIAGNOSIS — I25119 Atherosclerotic heart disease of native coronary artery with unspecified angina pectoris: Secondary | ICD-10-CM

## 2016-10-21 DIAGNOSIS — I10 Essential (primary) hypertension: Secondary | ICD-10-CM

## 2016-10-21 DIAGNOSIS — I739 Peripheral vascular disease, unspecified: Secondary | ICD-10-CM

## 2016-10-21 NOTE — Telephone Encounter (Signed)
[facility] is call[facility] about the refill request on patient diabetic testing strips   Best number is [place] [phone]

## 2016-10-21 NOTE — Telephone Encounter (Signed)
Script faxed to [facility] pharmacy 

## 2016-10-23 ENCOUNTER — Telehealth (HOSPITAL_COMMUNITY): Payer: Self-pay

## 2016-10-23 NOTE — Telephone Encounter (Signed)
Encounter complete. 

## 2016-10-24 ENCOUNTER — Ambulatory Visit (HOSPITAL_COMMUNITY)
Admission: RE | Admit: 2016-10-24 | Discharge: 2016-10-24 | Disposition: A | Discharge status: Home / Self Care | Payer: Medicare Other | Source: Ambulatory Visit | Attending: Cardiology | Admitting: Cardiology

## 2016-10-24 DIAGNOSIS — R0789 Other chest pain: Secondary | ICD-10-CM | POA: Diagnosis not present

## 2016-10-24 DIAGNOSIS — R42 Dizziness and giddiness: Secondary | ICD-10-CM | POA: Insufficient documentation

## 2016-10-24 DIAGNOSIS — Z9861 Coronary angioplasty status: Secondary | ICD-10-CM | POA: Diagnosis not present

## 2016-10-24 DIAGNOSIS — Z6830 Body mass index (BMI) 30.0-30.9, adult: Secondary | ICD-10-CM | POA: Insufficient documentation

## 2016-10-24 DIAGNOSIS — I2510 Atherosclerotic heart disease of native coronary artery without angina pectoris: Secondary | ICD-10-CM | POA: Diagnosis not present

## 2016-10-24 DIAGNOSIS — Z87891 Personal history of nicotine dependence: Secondary | ICD-10-CM | POA: Diagnosis not present

## 2016-10-24 DIAGNOSIS — I10 Essential (primary) hypertension: Secondary | ICD-10-CM | POA: Diagnosis not present

## 2016-10-24 DIAGNOSIS — E119 Type 2 diabetes mellitus without complications: Secondary | ICD-10-CM | POA: Diagnosis not present

## 2016-10-24 DIAGNOSIS — E663 Overweight: Secondary | ICD-10-CM | POA: Diagnosis not present

## 2016-10-24 DIAGNOSIS — Z8249 Family history of ischemic heart disease and other diseases of the circulatory system: Secondary | ICD-10-CM | POA: Diagnosis not present

## 2016-10-24 DIAGNOSIS — R002 Palpitations: Secondary | ICD-10-CM | POA: Diagnosis not present

## 2016-10-24 DIAGNOSIS — R0609 Other forms of dyspnea: Secondary | ICD-10-CM | POA: Diagnosis not present

## 2016-10-24 DIAGNOSIS — I25119 Atherosclerotic heart disease of native coronary artery with unspecified angina pectoris: Secondary | ICD-10-CM

## 2016-10-24 LAB — MYOCARDIAL PERFUSION IMAGING
LV dias vol: 129 mL (ref 62–150)
LV sys vol: 66 mL
Peak HR: 85 {beats}/min
Rest HR: 63 {beats}/min
SDS: 3
SRS: 2
SSS: 5
TID: 1.14

## 2016-10-24 MED ORDER — TECHNETIUM TC 99M TETROFOSMIN IV KIT
30.9000 | PACK | Freq: Once | INTRAVENOUS | Status: AC | PRN
  Administered 2016-10-24: 30.9 via INTRAVENOUS
  Filled 2016-10-24: qty 31

## 2016-10-24 MED ORDER — TECHNETIUM TC 99M TETROFOSMIN IV KIT
10.9000 | PACK | Freq: Once | INTRAVENOUS | Status: AC | PRN
  Administered 2016-10-24: 10.9 via INTRAVENOUS
  Filled 2016-10-24: qty 11

## 2016-10-24 MED ORDER — REGADENOSON 0.4 MG/5ML IV SOLN
0.4000 mg | Freq: Once | INTRAVENOUS | Status: AC
  Administered 2016-10-24: 0.4 mg via INTRAVENOUS

## 2016-10-30 ENCOUNTER — Telehealth: Payer: Self-pay | Admitting: *Deleted

## 2016-10-30 NOTE — Telephone Encounter (Signed)
-----   Message from Skeet Latch, MD sent at 10/27/2016 10:02 AM EDT ----- Stress test shows that the heart muscle may be weakened but there is no lack of blood flow.  Suggest getting an echo, which will be a better assessment of how well his heart is squeezing.

## 2016-10-30 NOTE — Telephone Encounter (Signed)
Left message to call back  

## 2016-10-30 NOTE — Telephone Encounter (Signed)
-----   Message from Skeet Latch, MD sent at 10/27/2016 10:18 AM EDT ----- Mild to moderate blockage in both legs.  Repeat in 1 year.  For now continue aspirin and he should be on a statin cholesterol medication.  If he doesn't recall any problems with statins, please start atorvastatin 80 mg.

## 2016-11-05 ENCOUNTER — Encounter: Payer: Self-pay | Admitting: Cardiovascular Disease

## 2016-11-05 ENCOUNTER — Ambulatory Visit (INDEPENDENT_AMBULATORY_CARE_PROVIDER_SITE_OTHER): Payer: Medicare Other | Admitting: Cardiovascular Disease

## 2016-11-05 VITALS — BP 198/98 | HR 82 | Ht 68.0 in | Wt 201.0 lb

## 2016-11-05 DIAGNOSIS — E7800 Pure hypercholesterolemia, unspecified: Secondary | ICD-10-CM | POA: Diagnosis not present

## 2016-11-05 DIAGNOSIS — I25119 Atherosclerotic heart disease of native coronary artery with unspecified angina pectoris: Secondary | ICD-10-CM | POA: Diagnosis not present

## 2016-11-05 DIAGNOSIS — I739 Peripheral vascular disease, unspecified: Secondary | ICD-10-CM

## 2016-11-05 DIAGNOSIS — Z91199 Patient's noncompliance with other medical treatment and regimen due to unspecified reason: Secondary | ICD-10-CM

## 2016-11-05 DIAGNOSIS — Z9119 Patient's noncompliance with other medical treatment and regimen: Secondary | ICD-10-CM

## 2016-11-05 DIAGNOSIS — D649 Anemia, unspecified: Secondary | ICD-10-CM

## 2016-11-05 DIAGNOSIS — Z9861 Coronary angioplasty status: Secondary | ICD-10-CM

## 2016-11-05 DIAGNOSIS — Z955 Presence of coronary angioplasty implant and graft: Secondary | ICD-10-CM | POA: Diagnosis not present

## 2016-11-05 DIAGNOSIS — I10 Essential (primary) hypertension: Secondary | ICD-10-CM

## 2016-11-05 MED ORDER — CARVEDILOL 25 MG PO TABS: 25.0000 mg | ORAL_TABLET | Freq: Two times a day (BID) | ORAL | 11 refills | Status: DC

## 2016-11-05 MED ORDER — CLOPIDOGREL BISULFATE 75 MG PO TABS: 75.0000 mg | ORAL_TABLET | Freq: Every day | ORAL | 11 refills | Status: DC

## 2016-11-05 NOTE — Progress Notes (Signed)
Cardiology Office Note   Date:  11/05/2016   ID:  [name], DOB 26-May-1946, MRN [id number]  PCP:  Ruben Reason, MD  Cardiologist:   Skeet Latch, MD   Chief Complaint  Patient presents with  . Follow-up    1 month; Pt states no Sx.      History of Present Illness: [name] is a 71 y.o. male with CAD s/p PCI, PAD s/p peripheral stenting, hypertension, poorly-controlled diabetes and non-compliance who presents for follow up.  He was first seen 09/2016. He was hospitalized 02/2016 with melena.   His hemoglobin was 7.1 at the time.  He declined work up and restarted Plavix on his own.  He underwent upper and lower endoscopy and was found to have diverticulosis but no source of bleeding.  No bleeding was found on capsule endoscopy at [facility].  He reports that an exploratory surgery was recommended but he declined.  He has decided to treat himself with increased fiber instead.  He ultimately restarted Plavix and denies recurrent bleeding.  Per noted with his PCP on 09/16/16 he admitted to missing several doses of clopidogrel.  However today he reports that he "never misses meds."   Per a letter from his daughter and talking with his wife he is very noncompliant with all medications. His most recent hemoblobin A1c is 10.2%.  [name] was previously a patient of Dr. [name]. He was instructed to follow up with Dr. [name] prior to restarting Plavix but never did.    [name] has a history of LAD stenting in 2016.  At his last appointment he reported atypical chest pain. He was referred for [facility] 10/24/16 that revealed LVEF 49% with no ischemia and global hypokinesis. He was referred for an echocardiogram which has not yet occurred. He also had ABIs that revealed greater than 50% right distal SFA stenosis status post angioplasty and 30-49% left mid to distal superficial SFA stenosis. One year follow-up was recommended.  He hasn't been taking carvedilol because he, "went to [place] 4  times and they never had it.  They didn't have clopidogrel either."  He reports numbness in his chest that lasts for several minutes and improves after drinking water.  He is otherwise without complaint.   Past Medical History:  Diagnosis Date  . Bleeding gastric ulcer 2000's  . Chronic anemia   . Coronary artery disease   . GERD (gastroesophageal reflux disease)   . History of blood transfusion 2000's   "related to bleeding gastric ulcer"  . Hyperlipidemia   . Hypertension   . PVD (peripheral vascular disease) ([place])   . S/P coronary artery stent placement 10/03/2016  . Type II diabetes mellitus ([place])     Past Surgical History:  Procedure Laterality Date  . CARDIAC CATHETERIZATION N/A 04/24/2015   Procedure: Left Heart Cath and Coronary Angiography;  Surgeon: [name], MD;  Location: [place] [facility];  Service: Cardiovascular;  Laterality: N/A;  . CARDIAC CATHETERIZATION N/A 04/24/2015   Procedure: Coronary Stent Intervention;  Surgeon: [name], MD;  Location: [place] [facility];  Service: Cardiovascular;  Laterality: N/A;  Prox RCA  . CARDIAC CATHETERIZATION Right 05/29/2015   Procedure: Angioplasty Perioneal and SFA;  Surgeon: [name], MD;  Location: [place] [facility];  Service: Cardiovascular;  Laterality: Right;  . COLONOSCOPY Left 02/29/2016   Procedure: COLONOSCOPY;  Surgeon: Carol Ada, MD;  [facility];  Service: Endoscopy;  Laterality: Left;  . COLONOSCOPY WITH PROPOFOL N/A 11/23/2015  Procedure: COLONOSCOPY WITH PROPOFOL;  Surgeon: Carol Ada, MD;  [facility];  Service: Endoscopy;  Laterality: N/A;  . CORONARY ANGIOPLASTY    . ESOPHAGOGASTRODUODENOSCOPY (EGD) WITH PROPOFOL N/A 11/23/2015   Procedure: ESOPHAGOGASTRODUODENOSCOPY (EGD) WITH PROPOFOL;  Surgeon: Carol Ada, MD;  [facility];  Service: Endoscopy;  Laterality: N/A;  . EXCISIONAL HEMORRHOIDECTOMY  1980's  . GIVENS CAPSULE STUDY N/A 02/29/2016   Procedure: GIVENS CAPSULE  STUDY;  Surgeon: Carol Ada, MD;  [facility];  Service: Endoscopy;  Laterality: N/A;  . PERIPHERAL VASCULAR CATHETERIZATION N/A 04/24/2015   Procedure: Lower Extremity Angiography;  Surgeon: [name], MD;  Location: [place] [facility];  Service: Cardiovascular;  Laterality: N/A;  . PERIPHERAL VASCULAR CATHETERIZATION N/A 05/29/2015   Procedure: Lower Extremity Angiography;  Surgeon: [name], MD;  Location: [place] [facility];  Service: Cardiovascular;  Laterality: N/A;  . PERIPHERAL VASCULAR CATHETERIZATION Left 05/29/2015   Procedure: Renal Angiography;  Surgeon: [name], MD;  Location: [place] [facility];  Service: Cardiovascular;  Laterality: Left;  . PERIPHERAL VASCULAR CATHETERIZATION Left 06/12/2015   Procedure: Peripheral Vascular Atherectomy;  Surgeon: [name], MD;  Location: [place] [facility];  Service: Cardiovascular;  Laterality: Left;  AT  . PERIPHERAL VASCULAR CATHETERIZATION Left 06/12/2015   Procedure: Peripheral Vascular Intervention;  Surgeon: [name], MD;  Location: [place] [facility];  Service: Cardiovascular;  Laterality: Left;  PTA TP TRUNK  . UPPER GI ENDOSCOPY  12/16/11   findings:  mild gastritis and duodenitis     Current Outpatient Prescriptions  Medication Sig Dispense Refill  . aspirin 81 MG tablet Take 1 tablet (81 mg total) by mouth daily. 30 tablet 11  . Blood Glucose Monitoring Suppl (ONE TOUCH ULTRA SYSTEM KIT) w/Device KIT 1 kit by Does not apply route once. Test blood glucose 3 (three) times per day Diagnosis Code: E 11.51    DM2 1 each 0  . esomeprazole (NEXIUM) 40 MG packet Take 40 mg by mouth daily before breakfast. 30 each 5  . ferrous sulfate 325 (65 FE) MG tablet Take 1 tablet (325 mg total) by mouth 2 (two) times daily with a meal. 30 tablet 11  . glimepiride (AMARYL) 4 MG tablet Take 1 tablet (4 mg total) by mouth daily before breakfast. 30 tablet 3  . lisinopril (PRINIVIL,ZESTRIL) 20 MG tablet Take 1 tablet (20 mg total) by  mouth daily. 30 tablet 5  . metFORMIN (GLUCOPHAGE-XR) 500 MG 24 hr tablet Take 1 tablet (500 mg total) by mouth daily with breakfast. 30 tablet 11  . triamcinolone cream (KENALOG) 0.1 % Apply 1 application topically 2 (two) times daily. 30 g 0  . UNABLE TO FIND Meter,lancets  and test strips covered by patients insurance  Test 3 times per day  Dx. E11.51  DM2 100 each 2  . carvedilol (COREG) 25 MG tablet Take 1 tablet (25 mg total) by mouth 2 (two) times daily. 60 tablet 11  . clopidogrel (PLAVIX) 75 MG tablet Take 1 tablet (75 mg total) by mouth daily. 30 tablet 11  . glucose blood (BAYER CONTOUR NEXT TEST) test strip Test blood sugar three times per day Dx Code:  E11.51    DM2 (Patient not taking: Reported on 11/05/2016) 100 each 5  . nitroGLYCERIN (NITROLINGUAL) 0.4 MG/SPRAY spray Place 1 spray under the tongue every 5 (five) minutes x 3 doses as needed for chest pain. (Patient not taking: Reported on 11/05/2016) 12 g 0   No current facility-administered medications  for this visit.     Allergies:   Patient has no known allergies.    Social History:  The patient  reports that he quit smoking about 20 months ago. His smoking use included Cigars and Cigarettes. He has a 54.00 pack-year smoking history. He has quit using smokeless tobacco. His smokeless tobacco use included Chew. He reports that he does not drink alcohol or use drugs.   Family History:  The patient's family history includes Alcohol abuse in his father; Diabetes in his mother; Heart attack in his father; Hypertension in his father; Peripheral Artery Disease in his father.    ROS:  Please see the history of present illness.   Otherwise, review of systems are positive for none.   All other systems are reviewed and negative.    PHYSICAL EXAM: VS:  BP (!) 198/98   Pulse 82   Ht _0  (1.727 m)   Wt 91.2 kg (201 lb)   BMI 30.56 kg/m  , BMI Body mass index is 30.56 kg/m. GENERAL:  Well appearing HEENT:  Pupils equal round  and reactive, fundi not visualized, oral mucosa unremarkable NECK:  No jugular venous distention, waveform within normal limits, carotid upstroke brisk and symmetric, no bruits LYMPHATICS:  No cervical adenopathy LUNGS:  Clear to auscultation bilaterally HEART:  RRR.  PMI not displaced or sustained,S1 and S2 within normal limits, no S3, no S4, no clicks, no rubs, no murmurs ABD:  Flat, positive bowel sounds normal in frequency in pitch, no bruits, no rebound, no guarding, no midline pulsatile mass, no hepatomegaly, no splenomegaly EXT:  2 plus pulses throughout, no edema, no cyanosis no clubbing SKIN:  No rashes no nodules NEURO:  Cranial nerves II through XII grossly intact, motor grossly intact throughout PSYCH:  Cognitively intact, oriented to person place and time   EKG:  EKG is not ordered today.  Lexiscan Myoview 10/24/16: The left ventricular ejection fraction is mildly decreased (45-54%).  Nuclear stress EF: 49%.  There was no ST segment deviation noted during stress.  The study is normal.  This is a low risk study.  ABI 10/21/16:  >50% right distal superficial femoral artery stenosis, s/p angioplasty. 30-49% left mid to distal superficial femoral artery stenosis. Three vessel run-off, bilaterally.   Recent Labs: 02/27/2016: B Natriuretic Peptide 21.0 03/01/2016: Hemoglobin 8.1 09/16/2016: ALT 10; BUN 14; Creatinine, Ser 1.28; Platelets 377; Potassium 4.3; Sodium 139    Lipid Panel    Component Value Date/Time   CHOL 151 10/31/2015 1559   TRIG 136 10/31/2015 1559   HDL 41 10/31/2015 1559   CHOLHDL 3.7 10/31/2015 1559   VLDL 27 10/31/2015 1559   LDLCALC 83 10/31/2015 1559      Wt Readings from Last 3 Encounters:  11/05/16 91.2 kg (201 lb)  10/24/16 91.6 kg (202 lb)  10/21/16 91.6 kg (202 lb)      ASSESSMENT AND PLAN:  # Atypical chest pain:  [name] symptoms are atypical.  Stress test was negative for ischemia. I suspect that his symptoms are more GI  related, as he improved with drinking water. However he doesn't exercise enough to know if his symptoms are exertional.  Continue aspirin and clopidogrel.  PCI was in 2016.  Clopidogrel can be stopped if he develops recurrent bleeding. Start carvedilol 25 mg bid.  # Hypertension:  Blood pressure is poorly-controlled. He never filled his prescription for carvedilol. Given his very elevated blood pressure today, we will start carvedilol 25 mg so 12.5 mg.  Continue lisinopril. He will return in 2 weeks for blood pressure check. I stressed the importance of filling his medication. If he is unable to get it filled with goes to [place] today he is to call us from the pharmacy. We also discussed the importance of limiting his salt intake. He tried to explain to me that there is no salt and the bacon that he eats. I recommended that he start increasing his intake of fruits and vegetables and limiting his intake of red meat and pork.  # Leg numbness: # PAD: Mild PAD on the L and moderate on the R.  Repeat ABIs 10/2016.  We will start rosuvastatin 20 mg daily.  Continue aspirin and clopidogrel.  # Noncompliance: This is a huge problem.  He has very poor insight and makes excuses for everything.  Will continue to reiterate the importance of taking meds, exercising and improving diet.   Current medicines are reviewed at length with the patient today.  The patient does not have concerns regarding medicines.  The following changes have been made: Increase carvedilol to 25 mg bid.  Labs/ tests ordered today include:   No orders of the defined types were placed in this encounter.    Disposition:   FU with Jared Jessie C. Oval Linsey, MD, [facility] in 2 weeks   This note was written with the assistance of speech recognition software.  Please excuse any transcriptional errors.  Signed, Maddisen Vought C. Oval Linsey, MD, [facility]  11/05/2016 4:38 PM    [place]

## 2016-11-05 NOTE — Patient Instructions (Signed)
Medication Instructions: Your Carvedilol has INCREASED to 25 mg twice a day Please restart taking your Plavix, 75 mg daily  Follow-Up: Your physician recommends that you schedule a follow-up appointment in: 2 WEEKS WITH DR. Annandale   Any Additional Special Instructions Will Be Listed Below (If Applicable). Your physician has requested that you regularly monitor and record your blood pressure readings at home. Please use the same machine at the same time of day to check your readings and record them to bring to your follow-up visit.   If you need a refill on your cardiac medications before your next appointment, please call your pharmacy.

## 2016-11-11 NOTE — Telephone Encounter (Signed)
Dr Oval Linsey reviewed with patient at office visit

## 2016-11-12 NOTE — Telephone Encounter (Signed)
Patient was not instructed to start Atorvastatin, Dr Oval Linsey recommended Crestor 20 mg daily  Left message to call back

## 2016-11-27 ENCOUNTER — Ambulatory Visit (INDEPENDENT_AMBULATORY_CARE_PROVIDER_SITE_OTHER): Payer: Medicare Other | Admitting: Cardiovascular Disease

## 2016-11-27 ENCOUNTER — Encounter: Payer: Self-pay | Admitting: Cardiovascular Disease

## 2016-11-27 VITALS — BP 138/78 | HR 64 | Ht 68.0 in | Wt 199.8 lb

## 2016-11-27 DIAGNOSIS — Z91199 Patient's noncompliance with other medical treatment and regimen due to unspecified reason: Secondary | ICD-10-CM

## 2016-11-27 DIAGNOSIS — I25119 Atherosclerotic heart disease of native coronary artery with unspecified angina pectoris: Secondary | ICD-10-CM

## 2016-11-27 DIAGNOSIS — E7800 Pure hypercholesterolemia, unspecified: Secondary | ICD-10-CM | POA: Diagnosis not present

## 2016-11-27 DIAGNOSIS — I739 Peripheral vascular disease, unspecified: Secondary | ICD-10-CM | POA: Diagnosis not present

## 2016-11-27 DIAGNOSIS — Z9119 Patient's noncompliance with other medical treatment and regimen: Secondary | ICD-10-CM | POA: Diagnosis not present

## 2016-11-27 DIAGNOSIS — E1151 Type 2 diabetes mellitus with diabetic peripheral angiopathy without gangrene: Secondary | ICD-10-CM

## 2016-11-27 DIAGNOSIS — Z955 Presence of coronary angioplasty implant and graft: Secondary | ICD-10-CM | POA: Diagnosis not present

## 2016-11-27 DIAGNOSIS — I119 Hypertensive heart disease without heart failure: Secondary | ICD-10-CM

## 2016-11-27 DIAGNOSIS — I209 Angina pectoris, unspecified: Secondary | ICD-10-CM

## 2016-11-27 DIAGNOSIS — I10 Essential (primary) hypertension: Secondary | ICD-10-CM | POA: Diagnosis not present

## 2016-11-27 MED ORDER — LISINOPRIL 20 MG PO TABS: 20.0000 mg | ORAL_TABLET | Freq: Every day | ORAL | 5 refills | Status: DC

## 2016-11-27 MED ORDER — ROSUVASTATIN CALCIUM 20 MG PO TABS: 20.0000 mg | ORAL_TABLET | Freq: Every day | ORAL | 2 refills | Status: DC

## 2016-11-27 MED ORDER — GLIMEPIRIDE 4 MG PO TABS
4.0000 mg | ORAL_TABLET | Freq: Every day | ORAL | 0 refills | Status: DC
Start: 2016-11-27 — End: 2017-07-04

## 2016-11-27 NOTE — Patient Instructions (Addendum)
Medication Instructions:  RESTART YOU LISINOPRIL 20 MG, RX SENT TO THE PHARMACY  STOP CARVEDILOL   START BYSTOLIC 40 MG DAILY, IF THIS WORKS FOR YOU CALL THE OFFICE SO A NEW RX CAN BE SENT TO THE PHARMACY   START CRESTOR 20 MG DAILY  Labwork: NONE  Testing/Procedures: NONE  Follow-Up: Your physician recommends that you schedule a follow-up appointment in: 2 MONTH OV  If you need a refill on your cardiac medications before your next appointment, please call your pharmacy.

## 2016-11-27 NOTE — Progress Notes (Signed)
Cardiology Office Note     Date:  11/27/2016   ID:  [name], DOB [birth year], MRN [id number]  PCP:  Jared Reason, MD  Cardiologist:   Jared Latch, MD   No chief complaint on file.    History of Present Illness: [name] is a 71 y.o. male with CAD s/p LAD PCI, PAD s/p peripheral stenting, hypertension, poorly-controlled diabetes and non-compliance who presents for follow up.  He was first seen 09/2016. He was hospitalized 02/2016 with melena.   His hemoglobin was 7.1 at the time.  He declined work up and restarted Plavix on his own.  He underwent upper and lower endoscopy and was found to have diverticulosis but no source of bleeding.  No bleeding was found on capsule endoscopy at [facility].  He reports that an exploratory surgery was recommended but he declined.  He has decided to treat himself with increased fiber instead.  He ultimately restarted Plavix and denies recurrent bleeding.  Per noted with his PCP on 09/16/16 he admitted to missing several doses of clopidogrel.  However today he reports that he "never misses meds."   Per a letter from his daughter and talking with his wife he is very noncompliant with all medications. His most recent hemoblobin A1c is 10.2%.  [name] was previously a patient of Dr. Einar Green. He was instructed to follow up with Dr. Einar Green prior to restarting Plavix but never did.    Mr. [name] was referred for [facility] 10/24/16 that revealed LVEF 49% with no ischemia and global hypokinesis. He was referred for an echocardiogram which has not yet occurred. He also had ABIs that revealed greater than 50% right distal SFA stenosis status post angioplasty and 30-49% left mid to distal superficial SFA stenosis. One year follow-up was recommended.  At his last appointment he was started on carvedilol.  He thinks this makes him feel "high," like he is hallucinating.  He reports taking it only once per day.  He has been working on his diet and limiting pork.  He has  not been checking his blood pressure.   Past Medical History:  Diagnosis Date  . Bleeding gastric ulcer 2000's  . Chronic anemia   . Coronary artery disease   . GERD (gastroesophageal reflux disease)   . History of blood transfusion 2000's   "related to bleeding gastric ulcer"  . Hyperlipidemia   . Hypertension   . PVD (peripheral vascular disease) ([place])   . S/P coronary artery stent placement 10/03/2016  . Type II diabetes mellitus ([place])     Past Surgical History:  Procedure Laterality Date  . CARDIAC CATHETERIZATION N/A 04/24/2015   Procedure: Left Heart Cath and Coronary Angiography;  Surgeon: Jared Prows, MD;  Location: [place] [facility];  Service: Cardiovascular;  Laterality: N/A;  . CARDIAC CATHETERIZATION N/A 04/24/2015   Procedure: Coronary Stent Intervention;  Surgeon: Jared Prows, MD;  Location: [place] [facility];  Service: Cardiovascular;  Laterality: N/A;  Prox RCA  . CARDIAC CATHETERIZATION Right 05/29/2015   Procedure: Angioplasty Perioneal and SFA;  Surgeon: Jared Prows, MD;  Location: [place] [facility];  Service: Cardiovascular;  Laterality: Right;  . COLONOSCOPY Left 02/29/2016   Procedure: COLONOSCOPY;  Surgeon: Jared Ada, MD;  [facility];  Service: Endoscopy;  Laterality: Left;  . COLONOSCOPY WITH PROPOFOL N/A 11/23/2015   Procedure: COLONOSCOPY WITH PROPOFOL;  Surgeon: Jared Ada, MD;  [facility];  Service: Endoscopy;  Laterality: N/A;  . CORONARY ANGIOPLASTY    .  ESOPHAGOGASTRODUODENOSCOPY (EGD) WITH PROPOFOL N/A 11/23/2015   Procedure: ESOPHAGOGASTRODUODENOSCOPY (EGD) WITH PROPOFOL;  Surgeon: Jared Ada, MD;  [facility];  Service: Endoscopy;  Laterality: N/A;  . EXCISIONAL HEMORRHOIDECTOMY  1980's  . GIVENS CAPSULE STUDY N/A 02/29/2016   Procedure: GIVENS CAPSULE STUDY;  Surgeon: Jared Ada, MD;  [facility];  Service: Endoscopy;  Laterality: N/A;  . PERIPHERAL VASCULAR CATHETERIZATION N/A 04/24/2015   Procedure: Lower  Extremity Angiography;  Surgeon: Jared Prows, MD;  Location: [place] [facility];  Service: Cardiovascular;  Laterality: N/A;  . PERIPHERAL VASCULAR CATHETERIZATION N/A 05/29/2015   Procedure: Lower Extremity Angiography;  Surgeon: Jared Prows, MD;  Location: [place] [facility];  Service: Cardiovascular;  Laterality: N/A;  . PERIPHERAL VASCULAR CATHETERIZATION Left 05/29/2015   Procedure: Renal Angiography;  Surgeon: Jared Prows, MD;  Location: [place] [facility];  Service: Cardiovascular;  Laterality: Left;  . PERIPHERAL VASCULAR CATHETERIZATION Left 06/12/2015   Procedure: Peripheral Vascular Atherectomy;  Surgeon: Jared Prows, MD;  Location: [place] [facility];  Service: Cardiovascular;  Laterality: Left;  AT  . PERIPHERAL VASCULAR CATHETERIZATION Left 06/12/2015   Procedure: Peripheral Vascular Intervention;  Surgeon: Jared Prows, MD;  Location: [place] [facility];  Service: Cardiovascular;  Laterality: Left;  PTA TP TRUNK  . UPPER GI ENDOSCOPY  12/16/11   findings:  mild gastritis and duodenitis     Current Outpatient Prescriptions  Medication Sig Dispense Refill  . aspirin 81 MG tablet Take 1 tablet (81 mg total) by mouth daily. 30 tablet 11  . Blood Glucose Monitoring Suppl (ONE TOUCH ULTRA SYSTEM KIT) w/Device KIT 1 kit by Does not apply route once. Test blood glucose 3 (three) times per day Diagnosis Code: E 11.51    DM2 1 each 0  . carvedilol (COREG) 25 MG tablet Take 1 tablet (25 mg total) by mouth 2 (two) times daily. 60 tablet 11  . clopidogrel (PLAVIX) 75 MG tablet Take 1 tablet (75 mg total) by mouth daily. 30 tablet 11  . glimepiride (AMARYL) 4 MG tablet Take 1 tablet (4 mg total) by mouth daily before breakfast. 30 tablet 3  . metFORMIN (GLUCOPHAGE-XR) 500 MG 24 hr tablet Take 1 tablet (500 mg total) by mouth daily with breakfast. 30 tablet 11  . triamcinolone cream (KENALOG) 0.1 % Apply 1 application topically 2 (two) times daily. 30 g 0  . UNABLE TO FIND Meter,lancets  and test strips  covered by patients insurance  Test 3 times per day  Dx. E11.51  DM2 100 each 2   No current facility-administered medications for this visit.     Allergies:   Patient has no known allergies.    Social History:  The patient  reports that he quit smoking about 21 months ago. His smoking use included Cigars and Cigarettes. He has a 54.00 pack-year smoking history. He has quit using smokeless tobacco. His smokeless tobacco use included Chew. He reports that he does not drink alcohol or use drugs.   Family History:  The patient's family history includes Alcohol abuse in his father; Diabetes in his mother; Heart attack in his father; Hypertension in his father; Peripheral Artery Disease in his father.    ROS:  Please see the history of present illness.   Otherwise, review of systems are positive for none.   All other systems are reviewed and negative.    PHYSICAL EXAM: VS:  BP 138/78   Pulse 64   Ht '5\' 8"'  (1.727 m)   Wt 90.6  kg (199 lb 12.8 oz)   BMI 30.38 kg/m  , BMI Body mass index is 30.38 kg/m. GENERAL:  Well appearing HEENT:  Pupils equal round and reactive, fundi not visualized, oral mucosa unremarkable NECK:  No jugular venous distention, waveform within normal limits, carotid upstroke brisk and symmetric, no bruits LYMPHATICS:  No cervical adenopathy LUNGS:  Clear to auscultation bilaterally HEART:  RRR.  PMI not displaced or sustained,S1 and S2 within normal limits, no S3, no S4, no clicks, no rubs, no murmurs ABD:  Flat, positive bowel sounds normal in frequency in pitch, no bruits, no rebound, no guarding, no midline pulsatile mass, no hepatomegaly, no splenomegaly EXT:  2 plus pulses throughout, no edema, no cyanosis no clubbing SKIN:  No rashes no nodules NEURO:  Cranial nerves II through XII grossly intact, motor grossly intact throughout PSYCH:  Cognitively intact, oriented to person place and time   EKG:  EKG is not ordered today.  Lexiscan Myoview 10/24/16: The  left ventricular ejection fraction is mildly decreased (45-54%).  Nuclear stress EF: 49%.  There was no ST segment deviation noted during stress.  The study is normal.  This is a low risk study.  ABI 10/21/16:  >50% right distal superficial femoral artery stenosis, s/p angioplasty. 30-49% left mid to distal superficial femoral artery stenosis. Three vessel run-off, bilaterally.   Recent Labs: 02/27/2016: B Natriuretic Peptide 21.0 03/01/2016: Hemoglobin 8.1 09/16/2016: ALT 10; BUN 14; Creatinine, Ser 1.28; Platelets 377; Potassium 4.3; Sodium 139    Lipid Panel    Component Value Date/Time   CHOL 151 10/31/2015 1559   TRIG 136 10/31/2015 1559   HDL 41 10/31/2015 1559   CHOLHDL 3.7 10/31/2015 1559   VLDL 27 10/31/2015 1559   LDLCALC 83 10/31/2015 1559      Wt Readings from Last 3 Encounters:  11/27/16 90.6 kg (199 lb 12.8 oz)  11/05/16 91.2 kg (201 lb)  10/24/16 91.6 kg (202 lb)      ASSESSMENT AND PLAN:  # Hypertension:  Blood pressure is poorly-controlled. He has been taking carvedilol but only once per day.  He doesn't like how it makes him feel.  He also rand out of lisinopril 3 days ago.  We will switch carvedilol to nebivolol 40 mg daily and refill his lisinopril.  He was encouraged to continue working on diet and exercise.  # CAD s/p LAD PCI:  # Hyperlipidemia: Chest pain resolved.  Stress test was negative for ischemia.  Continue aspirin and clopidogrel.  PCI was in 2016.  Clopidogrel can be stopped if he develops recurrent bleeding. Continue aspirin and start rosuvastatin 20 mg daily.  Check lipids and CMP in 6 weeks.  # Leg numbness: # PAD: Mild PAD on the L and moderate on the R.  Repeat ABIs 10/2017.  We will start rosuvastatin 20 mg daily.  Continue aspirin and clopidogrel.  # Noncompliance: This continues to be a huge problem.  He has very poor insight and makes excuses for everything.  Will continue to reiterate the importance of taking meds, exercising  and improving diet.  Once daily medications if at all possible.  # Diabetes: Refill glimepiride.  He has been out of this medication for several days.   Current medicines are reviewed at length with the patient today.  The patient does not have concerns regarding medicines.  The following changes have been made: Switch carvedilol to nebivlol. Labs/ tests ordered today include:   No orders of the defined types were placed in  this encounter.    Disposition:   FU with Treyana Sturgell C. Oval Linsey, MD, [facility] in 2 months   This note was written with the assistance of speech recognition software.  Please excuse any transcriptional errors.  Signed, Nanda Bittick C. Oval Linsey, MD, [facility]  11/27/2016 1:44 PM    [facility]

## 2016-11-27 NOTE — Telephone Encounter (Signed)
Discussed at office visit today. 

## 2016-12-09 ENCOUNTER — Telehealth: Payer: Self-pay | Admitting: Cardiovascular Disease

## 2016-12-09 NOTE — Telephone Encounter (Signed)
New message    1. What dental office are you calling from? Dr Wyline Beady  2. What is your office phone and fax number? [phone] fax [phone]  3. What type of procedure is the patient having performed? 25 teeth removed   What date is procedure scheduled? Not schedule  4. What is your question (ex. Antibiotics prior to procedure, holding medication-we need to know how long dentist wants pt to hold med)?  Please advise, how long pt should hold plavix

## 2016-12-09 NOTE — Telephone Encounter (Signed)
Okay to hold all blood thinners 3 days prior to today's extraction

## 2016-12-09 NOTE — Telephone Encounter (Signed)
Returned call, spoke to [place] at oral surgeon's office. She clarified that the patient is getting full extractions done for total of 25 teeth, smoothing of gum line, and denture fitting.  He will need to be off Plavix for duration as recommended by cardiology.  Routed for recommendations/instructions.

## 2016-12-10 NOTE — Telephone Encounter (Signed)
Sent via Epic

## 2017-01-25 ENCOUNTER — Observation Stay (HOSPITAL_COMMUNITY)
Admission: EM | Admit: 2017-01-25 | Discharge: 2017-01-26 | Disposition: A | Discharge status: Home / Self Care | Payer: Medicare Other | Attending: Internal Medicine | Admitting: Internal Medicine

## 2017-01-25 ENCOUNTER — Emergency Department (HOSPITAL_COMMUNITY): Payer: Medicare Other

## 2017-01-25 ENCOUNTER — Encounter (HOSPITAL_COMMUNITY): Payer: Self-pay | Admitting: *Deleted

## 2017-01-25 DIAGNOSIS — I493 Ventricular premature depolarization: Secondary | ICD-10-CM | POA: Diagnosis not present

## 2017-01-25 DIAGNOSIS — I739 Peripheral vascular disease, unspecified: Secondary | ICD-10-CM | POA: Diagnosis present

## 2017-01-25 DIAGNOSIS — D509 Iron deficiency anemia, unspecified: Secondary | ICD-10-CM | POA: Diagnosis present

## 2017-01-25 DIAGNOSIS — I10 Essential (primary) hypertension: Secondary | ICD-10-CM | POA: Diagnosis present

## 2017-01-25 DIAGNOSIS — E669 Obesity, unspecified: Secondary | ICD-10-CM | POA: Diagnosis not present

## 2017-01-25 DIAGNOSIS — Z7982 Long term (current) use of aspirin: Secondary | ICD-10-CM | POA: Insufficient documentation

## 2017-01-25 DIAGNOSIS — I2510 Atherosclerotic heart disease of native coronary artery without angina pectoris: Secondary | ICD-10-CM | POA: Diagnosis not present

## 2017-01-25 DIAGNOSIS — E785 Hyperlipidemia, unspecified: Secondary | ICD-10-CM | POA: Insufficient documentation

## 2017-01-25 DIAGNOSIS — R531 Weakness: Secondary | ICD-10-CM | POA: Diagnosis not present

## 2017-01-25 DIAGNOSIS — Z8711 Personal history of peptic ulcer disease: Secondary | ICD-10-CM | POA: Diagnosis not present

## 2017-01-25 DIAGNOSIS — R51 Headache: Secondary | ICD-10-CM | POA: Diagnosis not present

## 2017-01-25 DIAGNOSIS — Z95828 Presence of other vascular implants and grafts: Secondary | ICD-10-CM | POA: Insufficient documentation

## 2017-01-25 DIAGNOSIS — E1165 Type 2 diabetes mellitus with hyperglycemia: Secondary | ICD-10-CM | POA: Diagnosis not present

## 2017-01-25 DIAGNOSIS — Z955 Presence of coronary angioplasty implant and graft: Secondary | ICD-10-CM | POA: Insufficient documentation

## 2017-01-25 DIAGNOSIS — Z7984 Long term (current) use of oral hypoglycemic drugs: Secondary | ICD-10-CM | POA: Insufficient documentation

## 2017-01-25 DIAGNOSIS — Z6828 Body mass index (BMI) 28.0-28.9, adult: Secondary | ICD-10-CM | POA: Diagnosis not present

## 2017-01-25 DIAGNOSIS — Q2733 Arteriovenous malformation of digestive system vessel: Secondary | ICD-10-CM | POA: Insufficient documentation

## 2017-01-25 DIAGNOSIS — I672 Cerebral atherosclerosis: Secondary | ICD-10-CM | POA: Insufficient documentation

## 2017-01-25 DIAGNOSIS — R404 Transient alteration of awareness: Secondary | ICD-10-CM | POA: Diagnosis not present

## 2017-01-25 DIAGNOSIS — Z9861 Coronary angioplasty status: Secondary | ICD-10-CM

## 2017-01-25 DIAGNOSIS — N179 Acute kidney failure, unspecified: Secondary | ICD-10-CM | POA: Insufficient documentation

## 2017-01-25 DIAGNOSIS — Z87891 Personal history of nicotine dependence: Secondary | ICD-10-CM | POA: Insufficient documentation

## 2017-01-25 DIAGNOSIS — E860 Dehydration: Secondary | ICD-10-CM | POA: Diagnosis not present

## 2017-01-25 DIAGNOSIS — K219 Gastro-esophageal reflux disease without esophagitis: Secondary | ICD-10-CM | POA: Diagnosis not present

## 2017-01-25 DIAGNOSIS — R55 Syncope and collapse: Secondary | ICD-10-CM | POA: Diagnosis not present

## 2017-01-25 DIAGNOSIS — K5730 Diverticulosis of large intestine without perforation or abscess without bleeding: Secondary | ICD-10-CM | POA: Diagnosis not present

## 2017-01-25 DIAGNOSIS — R42 Dizziness and giddiness: Secondary | ICD-10-CM | POA: Diagnosis not present

## 2017-01-25 DIAGNOSIS — R40241 Glasgow coma scale score 13-15, unspecified time: Secondary | ICD-10-CM | POA: Diagnosis not present

## 2017-01-25 DIAGNOSIS — K279 Peptic ulcer, site unspecified, unspecified as acute or chronic, without hemorrhage or perforation: Secondary | ICD-10-CM | POA: Diagnosis present

## 2017-01-25 DIAGNOSIS — E119 Type 2 diabetes mellitus without complications: Secondary | ICD-10-CM

## 2017-01-25 DIAGNOSIS — Z79899 Other long term (current) drug therapy: Secondary | ICD-10-CM | POA: Diagnosis not present

## 2017-01-25 DIAGNOSIS — Z7902 Long term (current) use of antithrombotics/antiplatelets: Secondary | ICD-10-CM | POA: Insufficient documentation

## 2017-01-25 LAB — COMPREHENSIVE METABOLIC PANEL
ALT: 14 U/L — ABNORMAL LOW (ref 17–63)
AST: 19 U/L (ref 15–41)
Albumin: 4 g/dL (ref 3.5–5.0)
Alkaline Phosphatase: 53 U/L (ref 38–126)
Anion gap: 10 (ref 5–15)
BUN: 12 mg/dL (ref 6–20)
CO2: 23 mmol/L (ref 22–32)
Calcium: 9.3 mg/dL (ref 8.9–10.3)
Chloride: 102 mmol/L (ref 101–111)
Creatinine, Ser: 1.38 mg/dL — ABNORMAL HIGH (ref 0.61–1.24)
GFR calc Af Amer: 57 mL/min — ABNORMAL LOW (ref >60)
GFR calc non Af Amer: 50 mL/min — ABNORMAL LOW (ref >60)
Glucose, Bld: 246 mg/dL — ABNORMAL HIGH (ref 65–99)
Potassium: 4.5 mmol/L (ref 3.5–5.1)
Sodium: 135 mmol/L (ref 135–145)
Total Bilirubin: 0.6 mg/dL (ref 0.3–1.2)
Total Protein: 8.6 g/dL — ABNORMAL HIGH (ref 6.5–8.1)

## 2017-01-25 LAB — TROPONIN I: Troponin I: <0.03 ng/mL (ref <0.03)

## 2017-01-25 LAB — GLUCOSE, CAPILLARY: Glucose-Capillary: 237 mg/dL — ABNORMAL HIGH (ref 65–99)

## 2017-01-25 LAB — CBC WITH DIFFERENTIAL/PLATELET
Basophils Absolute: 0.1 10*3/uL (ref 0.0–0.1)
Basophils Relative: 1 %
Eosinophils Absolute: 0 10*3/uL (ref 0.0–0.7)
Eosinophils Relative: 0 %
HCT: 28.7 % — ABNORMAL LOW (ref 39.0–52.0)
Hemoglobin: 8.7 g/dL — ABNORMAL LOW (ref 13.0–17.0)
Lymphocytes Relative: 11 %
Lymphs Abs: 1.3 10*3/uL (ref 0.7–4.0)
MCH: 22.4 pg — ABNORMAL LOW (ref 26.0–34.0)
MCHC: 30.3 g/dL (ref 30.0–36.0)
MCV: 74 fL — ABNORMAL LOW (ref 78.0–100.0)
Monocytes Absolute: 0.3 10*3/uL (ref 0.1–1.0)
Monocytes Relative: 3 %
Neutro Abs: 9.8 10*3/uL — ABNORMAL HIGH (ref 1.7–7.7)
Neutrophils Relative %: 85 %
Platelets: 371 10*3/uL (ref 150–400)
RBC: 3.88 MIL/uL — ABNORMAL LOW (ref 4.22–5.81)
RDW: 18.8 % — ABNORMAL HIGH (ref 11.5–15.5)
WBC: 11.5 10*3/uL — ABNORMAL HIGH (ref 4.0–10.5)

## 2017-01-25 LAB — PROTIME-INR
INR: 1.04
Prothrombin Time: 13.7 seconds (ref 11.4–15.2)

## 2017-01-25 LAB — I-STAT TROPONIN, ED: Troponin i, poc: 0 ng/mL (ref 0.00–0.08)

## 2017-01-25 LAB — MAGNESIUM: Magnesium: 2.1 mg/dL (ref 1.7–2.4)

## 2017-01-25 MED ORDER — SODIUM CHLORIDE 0.9 % IV SOLN
INTRAVENOUS | Status: AC
  Administered 2017-01-25: 1000 mL via INTRAVENOUS

## 2017-01-25 MED ORDER — CLOPIDOGREL BISULFATE 75 MG PO TABS
75.0000 mg | ORAL_TABLET | Freq: Every day | ORAL | Status: DC
  Administered 2017-01-26: 75 mg via ORAL
  Filled 2017-01-25: qty 1

## 2017-01-25 MED ORDER — ENOXAPARIN SODIUM 40 MG/0.4ML ~~LOC~~ SOLN
40.0000 mg | SUBCUTANEOUS | Status: DC
Start: 2017-01-26 — End: 2017-01-26
  Administered 2017-01-26: 40 mg via SUBCUTANEOUS
  Filled 2017-01-25: qty 0.4

## 2017-01-25 MED ORDER — MAGIC MOUTHWASH W/LIDOCAINE
5.0000 mL | Freq: Four times a day (QID) | ORAL | Status: DC
  Administered 2017-01-25 – 2017-01-26 (×2): 5 mL via ORAL
  Filled 2017-01-25 (×5): qty 5

## 2017-01-25 MED ORDER — ROSUVASTATIN CALCIUM 10 MG PO TABS
20.0000 mg | ORAL_TABLET | Freq: Every day | ORAL | Status: DC
  Administered 2017-01-26: 20 mg via ORAL
  Filled 2017-01-25: qty 2

## 2017-01-25 MED ORDER — ACETAMINOPHEN 325 MG PO TABS: 650.0000 mg | ORAL_TABLET | Freq: Four times a day (QID) | ORAL | Status: DC | PRN

## 2017-01-25 MED ORDER — ACETAMINOPHEN 650 MG RE SUPP: 650.0000 mg | Freq: Four times a day (QID) | RECTAL | Status: DC | PRN

## 2017-01-25 MED ORDER — SODIUM CHLORIDE 0.9% FLUSH
3.0000 mL | Freq: Two times a day (BID) | INTRAVENOUS | Status: DC
  Administered 2017-01-26: 3 mL via INTRAVENOUS

## 2017-01-25 MED ORDER — LACTATED RINGERS IV BOLUS (SEPSIS)
1000.0000 mL | Freq: Once | INTRAVENOUS | Status: AC
  Administered 2017-01-25: 1000 mL via INTRAVENOUS

## 2017-01-25 MED ORDER — INSULIN ASPART 100 UNIT/ML ~~LOC~~ SOLN
0.0000 [IU] | Freq: Every day | SUBCUTANEOUS | Status: DC
Start: 2017-01-25 — End: 2017-01-26
  Administered 2017-01-26: 2 [IU] via SUBCUTANEOUS

## 2017-01-25 MED ORDER — ASPIRIN EC 81 MG PO TBEC
81.0000 mg | DELAYED_RELEASE_TABLET | Freq: Every day | ORAL | Status: DC
  Administered 2017-01-26: 81 mg via ORAL
  Filled 2017-01-25: qty 1

## 2017-01-25 MED ORDER — INSULIN ASPART 100 UNIT/ML ~~LOC~~ SOLN
0.0000 [IU] | Freq: Three times a day (TID) | SUBCUTANEOUS | Status: DC
  Administered 2017-01-26: 2 [IU] via SUBCUTANEOUS
  Administered 2017-01-26 (×2): 5 [IU] via SUBCUTANEOUS

## 2017-01-25 NOTE — ED Provider Notes (Signed)
[facility] DEPT Provider Note   CSN: [id number] Arrival date & time: 01/25/17  1440     History   Chief Complaint Chief Complaint  Patient presents with  . Near Syncope    HPI [name] is a 71 y.o. male.  HPI  Patient is a 71 year old male with history significant for CAD, peripheral vascular disease with stents, history of anemia, currently on Plavix, who presents to the emergency department with a presyncopal episode. Recent tooth extraction one week ago, initially held Plavix but has restarted his Plavix. He was standing in the shower for approximately 10 minutes when he had tunnel vision, generalized weakness, felt like he was going to pass out. Went to step out of the shower and then fell hitting his left arm. Currently complaining of mild headache has been present since tooth extraction. Denies preceding chest pain, shortness of breath, her palpitations, nausea, vomiting, diaphoresis, focal weakness, severe headache. No recent fever or chills. Patient states that this has been occurring intermittently over the past week and he has not felt completely well since his tooth extraction. Patient has a history of GI bleeding, the area was diagnosed with a capsule endoscopy, however the source of the bleeding is unknown. Recommended to have an exploratory laparoscopy to explore the source of the bleeding, however patient denied. Denies recent melena or hematemesis.  Past Medical History:  Diagnosis Date  . Bleeding gastric ulcer 2000's  . Chronic anemia   . Coronary artery disease   . GERD (gastroesophageal reflux disease)   . History of blood transfusion 2000's   "related to bleeding gastric ulcer"  . Hyperlipidemia   . Hypertension   . PVD (peripheral vascular disease) ([place])   . S/P coronary artery stent placement 10/03/2016  . Type II diabetes mellitus [facility])     Patient Active Problem List   Diagnosis Date Noted  . Pre-syncope 01/25/2017  . S/P coronary artery stent  placement 10/03/2016  . Anemia due to chronic blood loss 02/28/2016  . Chest pain 02/28/2016  . Chest pain with high risk for cardiac etiology 04/22/2015  . Claudication in peripheral vascular disease ([place]) 04/22/2015  . Anemia, iron deficiency 10/17/2014  . Essential hypertension 10/17/2014  . DM2 (diabetes mellitus, type 2) ([place]) 04/08/2012  . PUD (peptic ulcer disease) 11/11/2011    Past Surgical History:  Procedure Laterality Date  . CARDIAC CATHETERIZATION N/A 04/24/2015   Procedure: Left Heart Cath and Coronary Angiography;  Surgeon: [name], MD;  Location: [place] CV LAB;  Service: Cardiovascular;  Laterality: N/A;  . CARDIAC CATHETERIZATION N/A 04/24/2015   Procedure: Coronary Stent Intervention;  Surgeon: [name], MD;  Location: [place] [facility];  Service: Cardiovascular;  Laterality: N/A;  Prox RCA  . CARDIAC CATHETERIZATION Right 05/29/2015   Procedure: Angioplasty Perioneal and SFA;  Surgeon: [name], MD;  Location: [place] [facility];  Service: Cardiovascular;  Laterality: Right;  . COLONOSCOPY Left 02/29/2016   Procedure: COLONOSCOPY;  Surgeon: Carol Ada, MD;  Location: [facility];  Service: Endoscopy;  Laterality: Left;  . COLONOSCOPY WITH PROPOFOL N/A 11/23/2015   Procedure: COLONOSCOPY WITH PROPOFOL;  Surgeon: Carol Ada, MD;  Location: [facility];  Service: Endoscopy;  Laterality: N/A;  . CORONARY ANGIOPLASTY    . ESOPHAGOGASTRODUODENOSCOPY (EGD) WITH PROPOFOL N/A 11/23/2015   Procedure: ESOPHAGOGASTRODUODENOSCOPY (EGD) WITH PROPOFOL;  Surgeon: Carol Ada, MD;  Location: [facility];  Service: Endoscopy;  Laterality: N/A;  . EXCISIONAL HEMORRHOIDECTOMY  1980's  . GIVENS CAPSULE STUDY N/A 02/29/2016  Procedure: GIVENS CAPSULE STUDY;  Surgeon: Carol Ada, MD;  Location: [facility];  Service: Endoscopy;  Laterality: N/A;  . PERIPHERAL VASCULAR CATHETERIZATION N/A 04/24/2015   Procedure: Lower Extremity Angiography;  Surgeon: [name], MD;  Location: [place] [facility];  Service: Cardiovascular;  Laterality: N/A;  . PERIPHERAL VASCULAR CATHETERIZATION N/A 05/29/2015   Procedure: Lower Extremity Angiography;  Surgeon: [name], MD;  Location: [place] [facility];  Service: Cardiovascular;  Laterality: N/A;  . PERIPHERAL VASCULAR CATHETERIZATION Left 05/29/2015   Procedure: Renal Angiography;  Surgeon: [name], MD;  Location: [place] [facility];  Service: Cardiovascular;  Laterality: Left;  . PERIPHERAL VASCULAR CATHETERIZATION Left 06/12/2015   Procedure: Peripheral Vascular Atherectomy;  Surgeon: [name], MD;  Location: [place] [facility];  Service: Cardiovascular;  Laterality: Left;  AT  . PERIPHERAL VASCULAR CATHETERIZATION Left 06/12/2015   Procedure: Peripheral Vascular Intervention;  Surgeon: [name], MD;  Location: [place] [facility];  Service: Cardiovascular;  Laterality: Left;  PTA TP TRUNK  . UPPER GI ENDOSCOPY  12/16/11   findings:  mild gastritis and duodenitis       Home Medications    Prior to Admission medications   Medication Sig Start Date End Date Taking? Authorizing Provider  aspirin 81 MG tablet Take 1 tablet (81 mg total) by mouth daily. 04/24/15   [name], MD  Blood Glucose Monitoring Suppl (ONE TOUCH ULTRA SYSTEM KIT) w/Device KIT 1 kit by Does not apply route once. Test blood glucose 3 (three) times per day Diagnosis Code: E 11.51    DM2 10/15/16 11/05/21  Ivar Drape D, PA  clopidogrel (PLAVIX) 75 MG tablet Take 1 tablet (75 mg total) by mouth daily. 11/05/16   Skeet Latch, MD  glimepiride (AMARYL) 4 MG tablet Take 1 tablet (4 mg total) by mouth daily before breakfast. Additional refills from PCP 11/27/16   Skeet Latch, MD  lisinopril (PRINIVIL,ZESTRIL) 20 MG tablet Take 1 tablet (20 mg total) by mouth daily. 11/27/16   Skeet Latch, MD  metFORMIN (GLUCOPHAGE-XR) 500 MG 24 hr tablet Take 1 tablet (500 mg total) by mouth daily with breakfast. 09/16/16   Ivar Drape D, PA  Nebivolol HCl  (BYSTOLIC) 20 MG TABS Take 40 mg by mouth daily.     [provider]  rosuvastatin (CRESTOR) 20 MG tablet Take 1 tablet (20 mg total) by mouth daily. 11/27/16 02/25/17  Skeet Latch, MD  triamcinolone cream (KENALOG) 0.1 % Apply 1 application topically 2 (two) times daily. 02/18/16   Leandrew Koyanagi, MD  UNABLE TO FIND Meter,lancets  and test strips covered by patients insurance  Test 3 times per day  Dx. E11.51  DM2 10/13/16   Joretta Bachelor, PA    Family History Family History  Problem Relation Age of Onset  . CVA Unknown   . Hypertension Unknown   . Diabetes Mother   . Hypertension Father   . Peripheral Artery Disease Father   . Alcohol abuse Father   . Heart attack Father     Social History Social History  Substance Use Topics  . Smoking status: Former Smoker    Packs/day: 1.00    Years: 54.00    Types: Cigars, Cigarettes    Quit date: 02/16/2015  . Smokeless tobacco: Former Systems developer    Types: Chew     Comment: "chewed some when I was young"  . Alcohol use No     Comment: 04/24/2015 "no beer in 2 wks; ;no liquor in  3-4 weeks; I'm not a real heavy drinker" states he stopped drining 2 yrs ago     Allergies   Patient has no known allergies.   Review of Systems Review of Systems  Constitutional: Negative for appetite change and fever.  HENT: Positive for dental problem. Negative for congestion.   Eyes: Negative for visual disturbance.  Respiratory: Negative for chest tightness and shortness of breath.   Cardiovascular: Negative for chest pain and palpitations.  Gastrointestinal: Negative for abdominal pain, blood in stool, diarrhea, nausea and vomiting.  Genitourinary: Negative for decreased urine volume, dysuria and flank pain.  Musculoskeletal: Negative for back pain and gait problem.  Skin: Negative for wound.  Neurological: Positive for syncope, weakness ( Generalized), light-headedness and headaches. Negative for dizziness, seizures and speech  difficulty.  Psychiatric/Behavioral: Negative for behavioral problems.     Physical Exam Updated Vital Signs BP (!) 160/86 (BP Location: Right Arm)   Pulse 88   Temp 98.8 F (37.1 C) (Oral)   Resp 18   Ht _0  (1.727 m)   Wt 82.6 kg (182 lb 1.6 oz)   SpO2 99%   BMI 27.69 kg/m   Physical Exam  Constitutional: He is oriented to person, place, and time. He appears well-developed and well-nourished. No distress.  HENT:  Head: Atraumatic.  Mouth/Throat: Oropharynx is clear and moist.  Eyes: Conjunctivae and EOM are normal. Pupils are equal, round, and reactive to light.  Neck: Normal range of motion. Neck supple.  Cardiovascular: Regular rhythm and intact distal pulses.   Murmur heard. Pulmonary/Chest: Effort normal and breath sounds normal. No respiratory distress. He has no wheezes.  Abdominal: Soft. He exhibits no distension. There is no tenderness. There is no guarding.  Genitourinary:  Genitourinary Comments: DRE unremarkable, no gross blood or melena  Musculoskeletal: Normal range of motion.  Neurological: He is alert and oriented to person, place, and time. He has normal strength. No cranial nerve deficit. GCS eye subscore is 4. GCS verbal subscore is 5. GCS motor subscore is 6.  Skin: Skin is warm. Capillary refill takes less than 2 seconds.  Psychiatric: He has a normal mood and affect.     ED Treatments / Results  Labs (all labs ordered are listed, but only abnormal results are displayed) Labs Reviewed  CBC WITH DIFFERENTIAL/PLATELET - Abnormal; Notable for the following:       Result Value   WBC 11.5 (*)    RBC 3.88 (*)    Hemoglobin 8.7 (*)    HCT 28.7 (*)    MCV 74.0 (*)    MCH 22.4 (*)    RDW 18.8 (*)    Neutro Abs 9.8 (*)    All other components within normal limits  COMPREHENSIVE METABOLIC PANEL - Abnormal; Notable for the following:    Glucose, Bld 246 (*)    Creatinine, Ser 1.38 (*)    Total Protein 8.6 (*)    ALT 14 (*)    GFR calc non Af Amer  50 (*)    GFR calc Af Amer 57 (*)    All other components within normal limits  GLUCOSE, CAPILLARY - Abnormal; Notable for the following:    Glucose-Capillary 237 (*)    All other components within normal limits  PROTIME-INR  MAGNESIUM  TROPONIN I  BASIC METABOLIC PANEL  CBC  FERRITIN  TROPONIN I  Randolm Idol, ED    EKG  EKG Interpretation  Date/Time:  Sunday January 25 2017 15:05:07 EDT Ventricular Rate:  75 PR  Interval:    QRS Duration: 101 QT Interval:  385 QTC Calculation: 430 R Axis:   9 Text Interpretation:  Sinus rhythm normal intervals No acute changes No significant change since last tracing Confirmed by Varney Biles [phone]) on 01/25/2017 3:13:39 PM       Radiology Ct Head Wo Contrast  Result Date: 01/25/2017 CLINICAL DATA:  Headache and near-syncope today. EXAM: CT HEAD WITHOUT CONTRAST TECHNIQUE: Contiguous axial images were obtained from the base of the skull through the vertex without intravenous contrast. COMPARISON:  None. FINDINGS: Brain: There is some cortical atrophy and chronic microvascular ischemic change. No evidence of acute abnormality including hemorrhage, infarct, mass lesion, mass effect, midline shift or abnormal extra-axial fluid collection. No hydrocephalus or pneumocephalus. Vascular: Atherosclerotic vascular disease noted. Skull: Intact. Sinuses/Orbits: Negative. Other: Negative. IMPRESSION: No acute abnormality. Mild atrophy and chronic microvascular ischemic change. Atherosclerosis. Electronically Signed   By: Inge Rise M.D.   On: 01/25/2017 16:34    Procedures Procedures (including critical care time)  Medications Ordered in ED Medications  rosuvastatin (CRESTOR) tablet 20 mg (not administered)  clopidogrel (PLAVIX) tablet 75 mg (not administered)  aspirin EC tablet 81 mg (not administered)  enoxaparin (LOVENOX) injection 40 mg (not administered)  sodium chloride flush (NS) 0.9 % injection 3 mL (3 mLs Intravenous Not Given  01/25/17 2311)  0.9 %  sodium chloride infusion (1,000 mLs Intravenous New Bag/Given 01/25/17 2118)  acetaminophen (TYLENOL) tablet 650 mg (not administered)    Or  acetaminophen (TYLENOL) suppository 650 mg (not administered)  insulin aspart (novoLOG) injection 0-9 Units (not administered)  insulin aspart (novoLOG) injection 0-5 Units (0 Units Subcutaneous Not Given 01/25/17 2310)  magic mouthwash w/lidocaine (5 mLs Oral Given 01/25/17 2315)  lactated ringers bolus 1,000 mL (0 mLs Intravenous Stopped 01/25/17 2055)     Initial Impression / Assessment and Plan / ED Course  I have reviewed the triage vital signs and the nursing notes.  Pertinent labs & imaging results that were available during my care of the patient were reviewed by me and considered in my medical decision making (see chart for details).     Patient is a 71 year old male postural history significant for CAD, gastric bleeding, on Plavix, recent dental extraction, who presents to the emergency department with a near syncopal episode. No preceding CP, SOB, HA. No acute distress, not ill-appearing. Afebrile, hemodynamically stable.  Differential includes ACS, arrhythmia, intracranial hemorrhage, electrolyte abnormality, anemia.  EKG showed normal sinus rhythm, normal intervals, no chamber enlargement. No signs of arrhythmia, normal QTC. No signs of Brug[place]. No signs of HOCM. No signs of acute ischemia. CT head showed no acute findings. Hemoglobin at baseline. No significant electrolyte abnormalities. Initial troponin undetectable.  Patient admitted for high-risk syncope. Stable for the floor for telemetry.  Final Clinical Impressions(s) / ED Diagnoses   Final diagnoses:  Near syncope    New Prescriptions Current Discharge Medication List       Nathaniel Man, MD 01/25/17 [place], [place], MD 01/28/17 1840

## 2017-01-25 NOTE — ED Triage Notes (Signed)
Patient comes in per [facility] near syncopal episode. 6 teeth pulled recently. H/a with near syncopal episode and dizziness. A/ox4. Feeling bad since Wednesday. Hx of HTN and DM, non compliant with meds. 20 LAC. Ems v/s 173/95, 83 HR, 97% RA, 300 cbg. ekg SR.

## 2017-01-25 NOTE — H&P (Signed)
Date: 01/25/2017               Patient Name:  [name] MRN: [id number]  DOB: 1946-03-31 Age / Sex: 71 y.o., male   PCP: [name], MD         Medical Service: Internal Medicine Teaching Service         Attending Physician: Dr. Joni Reining    First Contact: Dr. Ledell Noss Pager: [phone]  Second Contact: Dr. Jule Ser Pager: [phone]       After Hours (After 5p/  First Contact Pager: [phone]  weekends / holidays): Second Contact Pager: [phone]   Chief Complaint: Near syncope  History of Present Illness: [name] is a 71 y.o. gentleman with PMH CAD (s/p PCI/DES to RCA 2016, low risk NM study 10/2016), GI bleed (ileal AVM), iron deficiency anemia, PAD s/p stenting, HTN, poorly-controlled T2DM, and recent dental extractions on June 4th who presents for near syncope at home. He reports that since his six dental extractions his mouth has been very sore making chewing difficulty, and so has eaten almost nothing since then. He reports taking none of his prescribed medications, due to empty stomach. He has felt fatigued and remained in bed most of the last week Today he decided to shave and shower, and shaved standing in the bathroom without incident. Afterward while showering he turned his back to the water and suddenly felt dizzy, nauseous, and developed visual changes "like the walls were moving." His legs felt weak like they were about to give out. He then decided to get out of the shower and proceeded to fall to the ground, but did not lose consciousness or hit his head. His wife heard the crash and found him on the ground, grunting but initially not engaging in conversation and called 911. With the help of her daughter they assisted him to walk to bed, and he nearly fell again due to leg weakness. He denies any chest pain, dyspnea, palpitations, diaphoresis, headache, melena, hematochezia, or focal weakness. He denies previous similar experience but states these spells  of dizziness/weakness have been happening intermittently since his tooth extraction, and that they are typically relieved with rest for a few minutes.   In the ED he was afebrile, HR 81, RR 20, BP 152/108, SpO2 100% on room air and positive orthostatic blood pressures. CBC revealed a Hb 8.7 (from 9.3 in Jan 2018, MCV 74), and BMP with glucose 246 and Cr 1.38. Troponin 0.0 and EKG unchanged from previous. CT head showed no acute findings, chronic microvascular changes. He received a 1L bolus of NS and IMTS was contacted for admission.   Meds:  Aspirin 81 Plavix 75 Glimepiride 4mg   Lisinopril 20 Metformin 500 qd Nebivolol 40 mg daily Rosuvastatin (cresto) 20 Triamcinolon cream BID  Allergies: Allergies as of 01/25/2017  . (No Known Allergies)   Past Medical History:  Diagnosis Date  . Bleeding gastric ulcer 2000's  . Chronic anemia   . Coronary artery disease   . GERD (gastroesophageal reflux disease)   . History of blood transfusion 2000's   "related to bleeding gastric ulcer"  . Hyperlipidemia   . Hypertension   . PVD (peripheral vascular disease) ([place])   . S/P coronary artery stent placement 10/03/2016  . Type II diabetes mellitus ([facility])    Family History:  Family History  Problem Relation Age of Onset  . CVA Unknown   . Hypertension Unknown   . Diabetes Mother   .  Hypertension Father   . Peripheral Artery Disease Father   . Alcohol abuse Father   . Heart attack Father    Social History:  Social History   Social History  . Marital status: Married    Spouse name: N/A  . Number of children: N/A  . Years of education: N/A   Occupational History  . Not on file.   Social History Main Topics  . Smoking status: Former Smoker    Packs/day: 1.00    Years: 54.00    Types: Cigars, Cigarettes    Quit date: 02/16/2015  . Smokeless tobacco: Former Systems developer    Types: Chew     Comment: "chewed some when I was young"  . Alcohol use No     Comment: 04/24/2015 "no beer in 2 wks;  ;no liquor in 3-4 weeks; I'm not a real heavy drinker" states he stopped drining 2 yrs ago  . Drug use: No  . Sexual activity: No   Other Topics Concern  . Not on file   Social History Narrative  . No narrative on file   Review of Systems: A complete ROS was negative except as per HPI.   Physical Exam: Blood pressure (!) 168/93, pulse 79, resp. rate (!) 28, height 5\' 8"  (1.727 m), weight 195 lb (88.5 kg), SpO2 100 %.  General appearance: Elderly African American man resting comfortably in bed, in no distress, conversational and pleasant HENT: Normocephalic, atraumatic, moist mucous membranes, neck supple, oropharynx clear, no sign of dental infection Eyes: PERRL, EOM inact, non-icteric, no nystagmus Cardiovascular: Regular rate and rhythm, systolic murmur present Respiratory: Clear to auscultation bilaterally, normal work of breathing Abdomen: BS+, soft, non-tender, non-distended Extremities: Normal bulk and range of motion, no edema, 2+ peripheral pulses, warm and well-perfused Skin: Warm, dry, intact Neuro: Alert and oriented, cranial nerves grossly intact, strength, sensation, and coordination grossly intact, gait deferred Psych: Appropriate affect, clear speech, thoughts linear and goal-directed    Assessment & Plan by Problem: Principal Problem:   Pre-syncope Active Problems:   PUD (peptic ulcer disease)   DM2 (diabetes mellitus, type 2) ([facility])   Anemia, iron deficiency   Essential hypertension   Claudication in peripheral vascular disease ([facility])   S/P coronary artery stent placement  Pre-syncope, likely some component vasovagal (hot shower) and orthostatic (prolonged standing) in the setting of very poor recent po intake due to mouth pain. Orthostatic blood pressures positive in the ED. No LOC or head trauma, CT head showing chronic microvascular changes but no acute findings. He does have significant history of CAD and PAD so investigating for cardiogenic /  cerebrovascular sources of transient hypotension is warranted as well.  -- IVF at 100 cc/hr overnight and recheck orthostatics in the AM -- TTE -- Carotid doppler US -- Telemetry -- Repeat EKG in AM -- Serial troponins, and trend CBC, BMP  Mouth pain, secondary to recent dental extractions -- Magic mouthwash with lidocaine QID -- Soft diet  History of iron deficiency anemia, Hb 8.7 from 9.3 in January, largely unchanged, microcytic MCV, history of ileal AVM causing blood loss anemia requiring hospitalization/transfusions in 2017. Previously prescribed iron tablets but no longer taking them. Recent dental bleeding but denies melena, hematochezia -- Trend CBC -- Check ferritin, may require IV iron or po supplementation (iron polysacc)  CAD, s/p PCI/DES to RCA in 2016, no recent chest pain, dyspnea, or palpitations -- Serial troponins and repeat EKG per above -- home Aspirin and Plavix  Poorly controlled T2DM, last A1c  10.2 in 08/2016, on Metformin and Glimepiride at home but non-complaint -- SSI-S TID AC HS  HLD -- home Rosuvastatin 20 mg daily (may have switched to Atorvastatin)  FEN/GI: Soft diet, replete electrolytes as needed  DVT ppx: Lovenox  Code status: Full code  Dispo: Admit patient to Observation with expected length of stay less than 2 midnights.  Signed: Asencion Partridge, MD 01/25/2017, 7:01 PM  Pager: [phone]

## 2017-01-26 ENCOUNTER — Observation Stay (HOSPITAL_COMMUNITY): Payer: Medicare Other

## 2017-01-26 DIAGNOSIS — Z9889 Other specified postprocedural states: Secondary | ICD-10-CM

## 2017-01-26 DIAGNOSIS — D509 Iron deficiency anemia, unspecified: Secondary | ICD-10-CM | POA: Diagnosis not present

## 2017-01-26 DIAGNOSIS — E1165 Type 2 diabetes mellitus with hyperglycemia: Secondary | ICD-10-CM | POA: Diagnosis not present

## 2017-01-26 DIAGNOSIS — Z955 Presence of coronary angioplasty implant and graft: Secondary | ICD-10-CM | POA: Diagnosis not present

## 2017-01-26 DIAGNOSIS — Z79899 Other long term (current) drug therapy: Secondary | ICD-10-CM

## 2017-01-26 DIAGNOSIS — E860 Dehydration: Secondary | ICD-10-CM | POA: Diagnosis not present

## 2017-01-26 DIAGNOSIS — Z87891 Personal history of nicotine dependence: Secondary | ICD-10-CM | POA: Diagnosis not present

## 2017-01-26 DIAGNOSIS — E1151 Type 2 diabetes mellitus with diabetic peripheral angiopathy without gangrene: Secondary | ICD-10-CM | POA: Diagnosis not present

## 2017-01-26 DIAGNOSIS — Q2733 Arteriovenous malformation of digestive system vessel: Secondary | ICD-10-CM

## 2017-01-26 DIAGNOSIS — K0889 Other specified disorders of teeth and supporting structures: Secondary | ICD-10-CM | POA: Diagnosis not present

## 2017-01-26 DIAGNOSIS — R55 Syncope and collapse: Secondary | ICD-10-CM | POA: Diagnosis not present

## 2017-01-26 DIAGNOSIS — I10 Essential (primary) hypertension: Secondary | ICD-10-CM

## 2017-01-26 DIAGNOSIS — Z833 Family history of diabetes mellitus: Secondary | ICD-10-CM

## 2017-01-26 DIAGNOSIS — Z9114 Patient's other noncompliance with medication regimen: Secondary | ICD-10-CM

## 2017-01-26 DIAGNOSIS — I2510 Atherosclerotic heart disease of native coronary artery without angina pectoris: Secondary | ICD-10-CM | POA: Diagnosis not present

## 2017-01-26 DIAGNOSIS — Z811 Family history of alcohol abuse and dependence: Secondary | ICD-10-CM

## 2017-01-26 DIAGNOSIS — K5730 Diverticulosis of large intestine without perforation or abscess without bleeding: Secondary | ICD-10-CM | POA: Diagnosis not present

## 2017-01-26 DIAGNOSIS — R51 Headache: Secondary | ICD-10-CM | POA: Diagnosis not present

## 2017-01-26 DIAGNOSIS — E785 Hyperlipidemia, unspecified: Secondary | ICD-10-CM | POA: Diagnosis not present

## 2017-01-26 DIAGNOSIS — Z8249 Family history of ischemic heart disease and other diseases of the circulatory system: Secondary | ICD-10-CM

## 2017-01-26 DIAGNOSIS — I739 Peripheral vascular disease, unspecified: Secondary | ICD-10-CM | POA: Diagnosis not present

## 2017-01-26 DIAGNOSIS — Z7902 Long term (current) use of antithrombotics/antiplatelets: Secondary | ICD-10-CM

## 2017-01-26 DIAGNOSIS — I493 Ventricular premature depolarization: Secondary | ICD-10-CM | POA: Diagnosis not present

## 2017-01-26 DIAGNOSIS — Z7982 Long term (current) use of aspirin: Secondary | ICD-10-CM

## 2017-01-26 DIAGNOSIS — Z823 Family history of stroke: Secondary | ICD-10-CM

## 2017-01-26 DIAGNOSIS — N179 Acute kidney failure, unspecified: Secondary | ICD-10-CM | POA: Diagnosis not present

## 2017-01-26 DIAGNOSIS — Z7984 Long term (current) use of oral hypoglycemic drugs: Secondary | ICD-10-CM

## 2017-01-26 LAB — BASIC METABOLIC PANEL
Anion gap: 8 (ref 5–15)
BUN: 12 mg/dL (ref 6–20)
CO2: 25 mmol/L (ref 22–32)
Calcium: 8.7 mg/dL — ABNORMAL LOW (ref 8.9–10.3)
Chloride: 102 mmol/L (ref 101–111)
Creatinine, Ser: 1.25 mg/dL — ABNORMAL HIGH (ref 0.61–1.24)
GFR calc Af Amer: >60 mL/min (ref >60)
GFR calc non Af Amer: 56 mL/min — ABNORMAL LOW (ref >60)
Glucose, Bld: 255 mg/dL — ABNORMAL HIGH (ref 65–99)
Potassium: 4.2 mmol/L (ref 3.5–5.1)
Sodium: 135 mmol/L (ref 135–145)

## 2017-01-26 LAB — CBC
HCT: 25.4 % — ABNORMAL LOW (ref 39.0–52.0)
Hemoglobin: 7.6 g/dL — ABNORMAL LOW (ref 13.0–17.0)
MCH: 22.2 pg — ABNORMAL LOW (ref 26.0–34.0)
MCHC: 29.9 g/dL — ABNORMAL LOW (ref 30.0–36.0)
MCV: 74.3 fL — ABNORMAL LOW (ref 78.0–100.0)
Platelets: 341 10*3/uL (ref 150–400)
RBC: 3.42 MIL/uL — ABNORMAL LOW (ref 4.22–5.81)
RDW: 18.9 % — ABNORMAL HIGH (ref 11.5–15.5)
WBC: 6.7 10*3/uL (ref 4.0–10.5)

## 2017-01-26 LAB — GLUCOSE, CAPILLARY
Glucose-Capillary: 272 mg/dL — ABNORMAL HIGH (ref 65–99)
Glucose-Capillary: 272 mg/dL — ABNORMAL HIGH (ref 65–99)
Glucose-Capillary: 183 mg/dL — ABNORMAL HIGH (ref 65–99)
Glucose-Capillary: 247 mg/dL — ABNORMAL HIGH (ref 65–99)

## 2017-01-26 LAB — FERRITIN: Ferritin: 5 ng/mL — ABNORMAL LOW (ref 24–336)

## 2017-01-26 LAB — TROPONIN I
Troponin I: 0.04 ng/mL (ref <0.03)
Troponin I: 0.05 ng/mL (ref <0.03)

## 2017-01-26 MED ORDER — FERROUS SULFATE 325 (65 FE) MG PO TABS: 325.0000 mg | ORAL_TABLET | Freq: Every day | ORAL | Status: DC

## 2017-01-26 MED ORDER — FERROUS SULFATE 325 (65 FE) MG PO TABS: 325.0000 mg | ORAL_TABLET | Freq: Every day | ORAL | 0 refills | Status: DC

## 2017-01-26 MED ORDER — LISINOPRIL 10 MG PO TABS
20.0000 mg | ORAL_TABLET | Freq: Every day | ORAL | Status: DC
  Filled 2017-01-26: qty 2

## 2017-01-26 MED ORDER — SODIUM CHLORIDE 0.9 % IV SOLN
510.0000 mg | Freq: Once | INTRAVENOUS | Status: DC
  Filled 2017-01-26: qty 17

## 2017-01-26 MED ORDER — GLUCERNA SHAKE PO LIQD
237.0000 mL | Freq: Three times a day (TID) | ORAL | Status: DC
  Administered 2017-01-26 (×2): 237 mL via ORAL

## 2017-01-26 MED ORDER — MAGIC MOUTHWASH W/LIDOCAINE: 5.0000 mL | Freq: Four times a day (QID) | ORAL | 0 refills | Status: DC

## 2017-01-26 MED ORDER — SODIUM CHLORIDE 0.9 % IV SOLN
INTRAVENOUS | Status: DC
  Administered 2017-01-26: 14:00:00 via INTRAVENOUS

## 2017-01-26 MED ORDER — SODIUM CHLORIDE 0.9 % IV BOLUS (SEPSIS)
500.0000 mL | Freq: Once | INTRAVENOUS | Status: AC
  Administered 2017-01-26: 500 mL via INTRAVENOUS

## 2017-01-26 NOTE — Care Management Obs Status (Signed)
[place] NOTIFICATION   Patient Details  Name: Navon Kotowski MRN: [id number] Date of Birth: 12/01/1945   Medicare Observation Status Notification Given:  Yes    Dawayne Patricia, RN 01/26/2017, 5:32 PM

## 2017-01-26 NOTE — H&P (Signed)
Internal Medicine Attending Admission Note  I saw and evaluated the patient. I reviewed the resident's note and I agree with the resident's findings and plan as documented in the resident's note.  Assessment & Plan by Problem:   Pre-syncope - Mostly likely due to volume depletion (orthostatics) with possible component of vasovagal (hot shower).  I suspect the volume depletion was due to his poor oral intake especially stopping his DM medications which caused hyperglycemia and osmotic diuresis. -Repeat orthostatics today - This does not sound cardiac, no chest pain, however second tropronin is 0.04, so we will obtain a third troponin.    DM2 (diabetes mellitus, type 2) (HCC) - SSI, consider starting lantus on discharge (uncontrolled and not taking his oral medications)    Anemia, iron deficiency - Restart oral iron, needs outpatient workup  Chief Complaint(s):dizziness  History - key components related to admission: [name] is a 71 year old male with past medical history of coronary artery disease, ileal AVM, iron deficiency anemia, peripheral arterial disease, hypertension, poorly controlled diabetes, who underwent dental extractions one week ago who presents for chief complaint dizziness. He reports that since his dental extractions he is had limited ability to chew) been eating much less. He also has not been taking his oral medications because of this. prior to presentation he reports that he has had frequent urination and has been trying to drink plenty of water but is not always able to. Yesterday he was taking a shower and during the shower he felt dizzy, nauseous, and bodily and slowly fell to the ground without losing consciousness. His wife heard the commotion and found him on the floor, she called 911. In the ED he was noted to be afebrile with positive orthostatics, he is a chronic anemia near his baseline and he was noted to be hyperglycemic. He was treated with IV fluids with  improvement of his symptoms  Lab results: Reviewed in Epic  Physical Exam - key components related to admission: General: resting in bed, obese male HEENT:EOMI, no scleral icterus, MMM Cardiac: RRR, no rubs, murmurs or gallops Pulm: clear to auscultation bilaterally, moving normal volumes of air Abd: soft, nontender, nondistended, BS present Ext: warm and well perfused, no pedal edema Neuro: alert and oriented X3  Vitals:   01/25/17 1900 01/25/17 1915 01/25/17 1952 01/26/17 0452  BP: (!) 148/72 115/71 (!) 160/86 (!) 147/78  Pulse: (!) 110 92 88 83  Resp: (!) 26 (!) 22 18 18   Temp:   98.8 F (37.1 C) 99.6 F (37.6 C)  TempSrc:   Oral Oral  SpO2: 100% 99% 99% 100%  Weight:   182 lb 1.6 oz (82.6 kg) 184 lb 9.6 oz (83.7 kg)  Height:

## 2017-01-26 NOTE — Progress Notes (Signed)
   Subjective:  Reports no concerns or complaints overnight. He had no further episodes of dizziness and denies chest pain or difficulty breathing.   Objective:  Vital signs in last 24 hours: Vitals:   01/25/17 1915 01/25/17 1952 01/26/17 0452 01/26/17 1320  BP: 115/71 (!) 160/86 (!) 147/78 (!) 178/78  Pulse: 92 88 83 93  Resp: (!) 22 18 18 18   Temp:  98.8 F (37.1 C) 99.6 F (37.6 C) 98.5 F (36.9 C)  TempSrc:  Oral Oral Oral  SpO2: 99% 99% 100% 100%  Weight:  182 lb 1.6 oz (82.6 kg) 184 lb 9.6 oz (83.7 kg)   Height:       Physical Exam  Constitutional: He is well-developed, well-nourished, and in no distress. No distress.  HENT:  Head: Normocephalic and atraumatic.  Eyes: Conjunctivae are normal. Right eye exhibits no discharge. Left eye exhibits no discharge. No scleral icterus.  Cardiovascular: Normal rate.  An irregular rhythm present.  No murmur heard. Pulmonary/Chest: Effort normal and breath sounds normal. No respiratory distress. He has no wheezes. He has no rales.  Abdominal: Soft. Bowel sounds are normal. He exhibits no distension. There is no tenderness.  Skin: He is not diaphoretic.     Assessment/Plan:   Pre-syncope Symptoms have improved today. He does not describe chest pain, shortness of breath, or palpitations and has no murmur on exam so we are reassured that this was more likely related to dehydration given his orthostatic abnormalities. Overnight he was on a NS gtt, orthostatics were rechecked this morning and his SBP dropped from 157 sitting to 126 standing. This is improved from the time of admission. He will receive an additional 1 L fluid bolus. If his orthostatic vitals improve after the 1 L he can be discharged this evening with plans for close cardiology follow up.  - follow up orthostatic vitals this evening.     CAD S/P coronary artery stent placement Elevated troponin  Troponin <0.03 > 0.04 > 0.05 when checked 10 hours later. He denies chest pain  and this troponin trend is not consistent with ACS. Initial EKG did was not consistent with acute cardiac ischemia.  - follow up evening EKG     PVC's (premature ventricular contractions) Multiple PVCs observed on telemetry. He does not seem to be having discomfort related to these. Would consider cardiac monitoring at outpatient follow up, if he is having PVCs frequently he may benefit from beta blockade to reduce the risk of tachycardic cardiomyopathy.  - recommend holter monitor     Poorly controlled DM2 (diabetes mellitus, type 2) ([place]) - continue ISS  - continue glimepiride and metformin at discharge     Anemia, iron deficiency - ordered oral iron supplementation at discharge     Essential hypertension Hypertensive today so I have reordered his home medication lisinopril 20 mg daily.    Dispo: Anticipated discharge today  Ledell Noss, MD 01/26/2017, 5:00 PM Pager: [phone]

## 2017-01-26 NOTE — Progress Notes (Signed)
Inpatient Diabetes Program Recommendations  AACE/ADA: New Consensus Statement on Inpatient Glycemic Control (2015)  Target Ranges:  Prepandial:   less than 140 mg/dL      Peak postprandial:   less than 180 mg/dL (1-2 hours)      Critically ill patients:  140 - 180 mg/dL   Lab Results  Component Value Date   GLUCAP 272 (H) 01/26/2017   HGBA1C 10.2 (H) 09/16/2016    Review of Glycemic Control Results for [name], [name] (MRN [id number]) as of 01/26/2017 13:13  Ref. Range 01/25/2017 21:11 01/26/2017 06:25 01/26/2017 10:59  Glucose-Capillary Latest Ref Range: 65 - 99 mg/dL 237 (H) 272 (H) 272 (H)   Diabetes history: DM 2 Outpatient Diabetes medications: Amaryl 4 mg + Metformin 500 mg qd Current orders for Inpatient glycemic control: Novolog correction 0-9 units tid + 0-5 units hs  Inpatient Diabetes Program Recommendations:  Noted elevated CBGs. Please consider: -A1c to determine prehospital glycemic control -While oral meds held, Lantus .2 units/kg x 83.7 kg = 17 units daily -Change diet to carb modified  Thank you, Bethena Roys E. Dean Wonder, RN, MSN, CDE  Diabetes Coordinator Inpatient Glycemic Control Team Team Pager [phone] (8am-5pm) 01/26/2017 1:17 PM

## 2017-01-26 NOTE — Discharge Instructions (Signed)
[name],  Charlyn Minerva glad you are feeling better today. We think your dizziness was related to your blood sugar being high and you being dehydrated.   For your anemia,  START taking iron tablets once daily  These cost about $1 at the [facility] outpatient [facility] and will not cause you to have blood in your stool   For your diabetes  CONTINUE taking your metformin and glimepiride   Schedule a follow up appointment to see your cardiologist within the next week.   If you have any questions about your hospitalization call the [place] internal medicine center [phone]

## 2017-01-26 NOTE — Progress Notes (Signed)
Initial Nutrition Assessment  DOCUMENTATION CODES:   Severe malnutrition in context of acute illness/injury  INTERVENTION:    Glucerna Shake po TID, each supplement provides 220 kcal and 10 grams of protein  NUTRITION DIAGNOSIS:   Malnutrition (severe) related to acute illness (recent tooth extraction) as evidenced by moderate depletion of body fat, moderate depletions of muscle mass, percent weight loss (8.5% weight loss in 3 months).  GOAL:   Patient will meet greater than or equal to 90% of their needs  MONITOR:   PO intake, Supplement acceptance  REASON FOR ASSESSMENT:   Malnutrition Screening Tool    ASSESSMENT:   71 yo male with PMH of CAD, HTN, HLD, PVD, DM2, anemia, bleeding gastric ulcer, GERD who was admitted on 6/10 with near syncope. Recent removal of top teeth on 6/4 with poor intake since that time.   Patient reports poor intake at home since 6/4 when he had his top teeth surgically removed. He continues to have mouth pain. He has lost 8.5% of his usual weight within the past 3 months.  Nutrition-Focused physical exam completed. Findings are mild-moderate fat depletion, mild-moderate muscle depletion, and no edema.  Labs and medications reviewed.  CBG's: 237-272-272 Currently on a soft diet, consuming 50% of meals. He is willing to try a liquid PO supplement to increase intake.  Diet Order:  DIET SOFT Room service appropriate? Yes; Fluid consistency: Thin  Skin:  Reviewed, no issues  Last BM:  6/9  Height:   Ht Readings from Last 1 Encounters:  01/25/17 5\' 8"  (1.727 m)    Weight:   Wt Readings from Last 1 Encounters:  01/26/17 184 lb 9.6 oz (83.7 kg)    Ideal Body Weight:  70 kg  BMI:  Body mass index is 28.07 kg/m.  Estimated Nutritional Needs:   Kcal:  2000-2200  Protein:  100-110 gm  Fluid:  2 L  EDUCATION NEEDS:   Education needs addressed (Discussed ways to increase protein and calorie intake.)  Molli Barrows, RD, LDN,  [place] Pager [phone] After Hours Pager [phone]

## 2017-01-26 NOTE — Discharge Summary (Signed)
Name: [name] MRN: [id number] DOB: [birth year] 71 y.o. PCP: [name], MD  Date of Admission: 01/25/2017  2:40 PM Date of Discharge: 01/26/2017 Attending Physician: Dr. Joni Reining   Discharge Diagnosis: 1. Pre-syncope   Anemia, iron deficiency  Discharge Medications: Allergies as of 01/26/2017   No Known Allergies     Medication List    TAKE these medications   aspirin 81 MG tablet Take 1 tablet (81 mg total) by mouth daily.   BYSTOLIC 20 MG Tabs Generic drug:  Nebivolol HCl Take 40 mg by mouth daily.   clopidogrel 75 MG tablet Commonly known as:  PLAVIX Take 1 tablet (75 mg total) by mouth daily.   ferrous sulfate 325 (65 FE) MG tablet Take 1 tablet (325 mg total) by mouth daily with breakfast. Start taking on:  01/27/2017   glimepiride 4 MG tablet Commonly known as:  AMARYL Take 1 tablet (4 mg total) by mouth daily before breakfast. Additional refills from PCP   lisinopril 20 MG tablet Commonly known as:  PRINIVIL,ZESTRIL Take 1 tablet (20 mg total) by mouth daily.   magic mouthwash w/lidocaine Soln Take 5 mLs by mouth 4 (four) times daily.   metFORMIN 500 MG 24 hr tablet Commonly known as:  GLUCOPHAGE-XR Take 1 tablet (500 mg total) by mouth daily with breakfast.   rosuvastatin 20 MG tablet Commonly known as:  CRESTOR Take 1 tablet (20 mg total) by mouth daily.       Disposition and follow-up:   [name] was discharged from [facility] in Stable condition.  At the hospital follow up visit please address:  1.  Iron deficiency anemia - ensure that he has resumed taking iron tablet supplementation.   2.  Labs / imaging needed at time of follow-up: CBC, holter monitor   3.  Pending labs/ test needing follow-up: none  Follow-up Appointments: Follow-up Information    [name], MD Follow up in 2 week(s).   Specialty:  Family Medicine Contact information: [place]  [address] [phone]        Skeet Latch, MD Follow up in 4 day(s).   Specialty:  Cardiology Contact information: [address] [place] [address] [phone]           Hospital Course by problem list:    Pre-syncope 71 yo man with PMH CAD (s/p PCI/DES to RCA 2016, low risk NM study 10/2016), GI bleed (ileal AVM), iron deficiency anemia, PAD s/p stenting, HTN, poorly-controlled T2DM, and recent dental extractions on June 4th who presents for near syncope in his shower at home. He had had been eating less and not taking his medications since recent dental extractions (due to inability to chew).  In the ED he was afebrile with positive orthostatics, his chronic anemia was near baseline and he was not hypoglycemic (blood glucose 246). His syncope was thought to be related to volume depletion (positive orthostatics, AKI) secondary to stopping diabetes medications and becoming hyperglycemic and possibly vasovagal. He denied chest pain and trop plateaued at 0.05, EKG didn't show signs of acute ischemia. He was treated with IV fluids and his symptoms and orthostatics improved.     DM2 (diabetes mellitus, type 2) ([place]) He was asked to continue taking metformin and glimepiride at discharge. He was provided with a prescription for pain relieving mouthwash.     Anemia, iron deficiency He had not been taking oral iron supplementation, hgb was 8.7, MCV 74, and ferritin  was 5. He did not have chest pain. His oral iron was restarted. Last colonoscopy 02/2016 showed AVMs in the illeum and diverticulosis.     Essential hypertension He was hypertensive (SBP 150) while his home lisinopril was initially held.     PVC's (premature ventricular contractions) Multiple PVCs were noted on his telemetry. Consider cardiac monitoring at follow up, if he continues to have PVCs he may benefit from beta blockage to reduce the risk of tachycardia related cardiomyopathy.   Discharge Vitals:   BP  (!) 147/79 (BP Location: Right Arm)   Pulse 90   Temp 98.9 F (37.2 C) (Oral)   Resp 18   Ht 5\' 8"  (1.727 m)   Wt 184 lb 9.6 oz (83.7 kg)   SpO2 100%   BMI 28.07 kg/m   Pertinent Labs, Studies, and Procedures:  Hgb 8.7 MCV 74  Ferritin 5   Discharge Instructions: Discharge Instructions    Call MD for:  extreme fatigue    Complete by:  As directed    Call MD for:  persistant dizziness or light-headedness    Complete by:  As directed    Call MD for:  persistant nausea and vomiting    Complete by:  As directed    Call MD for:  severe uncontrolled pain    Complete by:  As directed    Diet - low sodium heart healthy    Complete by:  As directed    Increase activity slowly    Complete by:  As directed       Signed: Ledell Noss, MD 01/26/2017, 10:40 PM   Pager: [phone]

## 2017-01-26 NOTE — Progress Notes (Signed)
Medication and follow up visit went over with patient and family. Voiced understanding. Patient denies any question.

## 2017-01-28 ENCOUNTER — Ambulatory Visit (INDEPENDENT_AMBULATORY_CARE_PROVIDER_SITE_OTHER): Payer: Medicare Other | Admitting: Cardiovascular Disease

## 2017-01-28 ENCOUNTER — Telehealth: Payer: Self-pay

## 2017-01-28 ENCOUNTER — Telehealth: Payer: Self-pay | Admitting: Cardiovascular Disease

## 2017-01-28 VITALS — BP 176/91 | HR 87 | Ht 68.0 in | Wt 192.0 lb

## 2017-01-28 DIAGNOSIS — K2961 Other gastritis with bleeding: Secondary | ICD-10-CM

## 2017-01-28 DIAGNOSIS — I739 Peripheral vascular disease, unspecified: Secondary | ICD-10-CM

## 2017-01-28 DIAGNOSIS — Z955 Presence of coronary angioplasty implant and graft: Secondary | ICD-10-CM | POA: Diagnosis not present

## 2017-01-28 DIAGNOSIS — R55 Syncope and collapse: Secondary | ICD-10-CM

## 2017-01-28 DIAGNOSIS — Z9861 Coronary angioplasty status: Secondary | ICD-10-CM | POA: Diagnosis not present

## 2017-01-28 DIAGNOSIS — R011 Cardiac murmur, unspecified: Secondary | ICD-10-CM

## 2017-01-28 DIAGNOSIS — D649 Anemia, unspecified: Secondary | ICD-10-CM

## 2017-01-28 DIAGNOSIS — I25119 Atherosclerotic heart disease of native coronary artery with unspecified angina pectoris: Secondary | ICD-10-CM | POA: Diagnosis not present

## 2017-01-28 MED ORDER — NEBIVOLOL HCL 20 MG PO TABS: 20.0000 mg | ORAL_TABLET | Freq: Every day | ORAL | 11 refills | Status: DC

## 2017-01-28 MED ORDER — NEBIVOLOL HCL 20 MG PO TABS: 20.0000 mg | ORAL_TABLET | Freq: Every day | ORAL | 0 refills | Status: DC

## 2017-01-28 NOTE — Progress Notes (Signed)
Cardiology Office Note     Date:  01/30/2017   ID:  [name], DOB [birth year], MRN [id number]  PCP:  [name], MD  Cardiologist:   Skeet Latch, MD   Chief Complaint  Patient presents with  . Follow-up    post hospital. patient staes he still has some weakness     History of Present Illness: [name] is a 71 y.o. male with CAD s/p LAD PCI, PAD s/p peripheral stenting, hypertension, poorly-controlled diabetes and non-compliance who presents for follow up.  He was first seen 09/2016. He was hospitalized 02/2016 with melena.   His hemoglobin was 7.1 at the time.  He declined work up and restarted Plavix on his own.  He underwent upper and lower endoscopy and was found to have diverticulosis but no source of bleeding.  No bleeding was found on capsule endoscopy at [facility].  He reports that an exploratory surgery was recommended but he declined.  He has decided to treat himself with increased fiber instead.  He ultimately restarted Plavix and denies recurrent bleeding.  Per noted with his PCP on 09/16/16 he admitted to missing several doses of clopidogrel.  However today he reports that he "never misses meds."   Per a letter from his daughter and talking with his wife he is very noncompliant with all medications. His most recent hemoblobin A1c is 10.2%.  Mr. [name] was previously a patient of Dr. [name]. He was instructed to follow up with Dr. [name] prior to restarting Plavix but never did.  Mr. Quevedo was referred for [facility] 10/24/16 that revealed LVEF 49% with no ischemia and global hypokinesis. He was referred for an echocardiogram which has not yet occurred. He also had ABIs that revealed greater than 50% right distal SFA stenosis status post angioplasty and 30-49% left mid to distal superficial SFA stenosis. One year follow-up was recommended.  At his last appointment [name] was not taking carvedilol as prescribed. He was only taking it once per day if at all because he  didn't like the way it made him feel. This was switched to nebivolol and he was encouraged to take his medications as prescribed. Since that appointment he was admitted to the hospital 01/2017 with presyncope. This was thought to be due to orthostatic hypotension versus vasovagal, as the episode occurred after taking a hot shower. Of note, he had stopped taking all his diabetes medications and had poor oral intake.  He had several teeth pulled earlier in the month and had difficulty eating.  Troponin was mildly elevated to 0.05. He had no chest pain and this was felt to be due to demand ischemia. EKG was not consistent with ischemia.  He was noted to have asymptomatic PVCs on telemetry.  Mr.  Green has not been taking any antihypertensives.  He notes that his BP has been high but still has orthostatic drops.  His BP was 167/81 sitting and 147/85 standing.  He reports mild dizziness.  He is also concerned b/c his hgb is low again.  He felt that he should have received a transfusion and been kept in the hospital to continue monitoring his blood count.  He was instructed to take iron but has not.  He endorses melena but no hematochezia.  He denies chest pain or shortness of breath.    Past Medical History:  Diagnosis Date  . Bleeding gastric ulcer 2000's  . Chronic anemia   . Coronary artery disease   . GERD (gastroesophageal reflux  disease)   . History of blood transfusion 2000's   "related to bleeding gastric ulcer"  . Hyperlipidemia   . Hypertension   . PVD (peripheral vascular disease) ([place])   . S/P coronary artery stent placement 10/03/2016  . Type II diabetes mellitus ([place])     Past Surgical History:  Procedure Laterality Date  . CARDIAC CATHETERIZATION N/A 04/24/2015   Procedure: Left Heart Cath and Coronary Angiography;  Surgeon: [name], MD;  Location: [place] [facility];  Service: Cardiovascular;  Laterality: N/A;  . CARDIAC CATHETERIZATION N/A 04/24/2015   Procedure: Coronary Stent  Intervention;  Surgeon: [name], MD;  Location: [place] [facility];  Service: Cardiovascular;  Laterality: N/A;  Prox RCA  . CARDIAC CATHETERIZATION Right 05/29/2015   Procedure: Angioplasty Perioneal and SFA;  Surgeon: [name], MD;  Location: [place] [facility];  Service: Cardiovascular;  Laterality: Right;  . COLONOSCOPY Left 02/29/2016   Procedure: COLONOSCOPY;  Surgeon: Carol Ada, MD;  Location: [facility];  Service: Endoscopy;  Laterality: Left;  . COLONOSCOPY WITH PROPOFOL N/A 11/23/2015   Procedure: COLONOSCOPY WITH PROPOFOL;  Surgeon: Carol Ada, MD;  Location: [facility];  Service: Endoscopy;  Laterality: N/A;  . CORONARY ANGIOPLASTY    . ESOPHAGOGASTRODUODENOSCOPY (EGD) WITH PROPOFOL N/A 11/23/2015   Procedure: ESOPHAGOGASTRODUODENOSCOPY (EGD) WITH PROPOFOL;  Surgeon: Carol Ada, MD;  Location: [facility];  Service: Endoscopy;  Laterality: N/A;  . EXCISIONAL HEMORRHOIDECTOMY  1980's  . GIVENS CAPSULE STUDY N/A 02/29/2016   Procedure: GIVENS CAPSULE STUDY;  Surgeon: Carol Ada, MD;  Location: [facility];  Service: Endoscopy;  Laterality: N/A;  . PERIPHERAL VASCULAR CATHETERIZATION N/A 04/24/2015   Procedure: Lower Extremity Angiography;  Surgeon: [name], MD;  Location: [place] [facility];  Service: Cardiovascular;  Laterality: N/A;  . PERIPHERAL VASCULAR CATHETERIZATION N/A 05/29/2015   Procedure: Lower Extremity Angiography;  Surgeon: [name], MD;  Location: [place] [facility];  Service: Cardiovascular;  Laterality: N/A;  . PERIPHERAL VASCULAR CATHETERIZATION Left 05/29/2015   Procedure: Renal Angiography;  Surgeon: [name], MD;  Location: [place] [facility];  Service: Cardiovascular;  Laterality: Left;  . PERIPHERAL VASCULAR CATHETERIZATION Left 06/12/2015   Procedure: Peripheral Vascular Atherectomy;  Surgeon: [name], MD;  Location: [place] [facility];  Service: Cardiovascular;  Laterality: Left;  AT  . PERIPHERAL VASCULAR CATHETERIZATION Left 06/12/2015    Procedure: Peripheral Vascular Intervention;  Surgeon: [name], MD;  Location: [place] [facility];  Service: Cardiovascular;  Laterality: Left;  PTA TP TRUNK  . UPPER GI ENDOSCOPY  12/16/11   findings:  mild gastritis and duodenitis     Current Outpatient Prescriptions  Medication Sig Dispense Refill  . aspirin 81 MG tablet Take 1 tablet (81 mg total) by mouth daily. 30 tablet 11  . ferrous sulfate 325 (65 FE) MG EC tablet Take 325 mg by mouth 2 (two) times daily with a meal.    . glimepiride (AMARYL) 4 MG tablet Take 1 tablet (4 mg total) by mouth daily before breakfast. Additional refills from PCP 30 tablet 0  . magic mouthwash w/lidocaine SOLN Take 5 mLs by mouth 4 (four) times daily. 100 mL 0  . metFORMIN (GLUCOPHAGE-XR) 500 MG 24 hr tablet Take 1 tablet (500 mg total) by mouth daily with breakfast. 30 tablet 11  . rosuvastatin (CRESTOR) 20 MG tablet Take 1 tablet (20 mg total) by mouth daily. 30 tablet 2  . Nebivolol HCl (BYSTOLIC) 20 MG TABS Take 1 tablet (20 mg total) by  mouth daily. 28 tablet 0   No current facility-administered medications for this visit.     Allergies:   Patient has no known allergies.    Social History:  The patient  reports that he quit smoking about 1 years ago. His smoking use included Cigars and Cigarettes. He has a 54.00 pack-year smoking history. He has quit using smokeless tobacco. His smokeless tobacco use included Chew. He reports that he does not drink alcohol or use drugs.   Family History:  The patient's family history includes Alcohol abuse in his father; Diabetes in his mother; Heart attack in his father; Hypertension in his father; Peripheral Artery Disease in his father.    ROS:  Please see the history of present illness.   Otherwise, review of systems are positive for none.   All other systems are reviewed and negative.    PHYSICAL EXAM: VS:  BP (!) 176/91   Pulse 87   Ht 5\' 8"  (1.727 m)   Wt 87.1 kg (192 lb)   BMI 29.19 kg/m  , BMI  Body mass index is 29.19 kg/m. GENERAL:  Well appearing.  No acute distress. HEENT:  Pupils equal round and reactive, fundi not visualized, oral mucosa unremarkable NECK:  No jugular venous distention, waveform within normal limits, carotid upstroke brisk and symmetric, no bruits LUNGS:  Clear to auscultation bilaterally.  No crackles, rhonchi or wheezes HEART:  RRR.  PMI not displaced or sustained,S1 and S2 within normal limits, no S3, no S4, no clicks, no rubs, II/VI systolic murmur at LUSB ABD:  Flat, positive bowel sounds normal in frequency in pitch, no bruits, no rebound, no guarding, no midline pulsatile mass, no hepatomegaly, no splenomegaly EXT:  2 plus pulses throughout, no edema, no cyanosis no clubbing SKIN:  No rashes no nodules NEURO:  Cranial nerves II through XII grossly intact, motor grossly intact throughout PSYCH:  Cognitively intact, oriented to person place and time   EKG:  EKG is not ordered today.  Lexiscan Myoview 10/24/16: The left ventricular ejection fraction is mildly decreased (45-54%).  Nuclear stress EF: 49%.  There was no ST segment deviation noted during stress.  The study is normal.  This is a low risk study.  ABI 10/21/16:  >50% right distal superficial femoral artery stenosis, s/p angioplasty. 30-49% left mid to distal superficial femoral artery stenosis. Three vessel run-off, bilaterally.   Recent Labs: 02/27/2016: B Natriuretic Peptide 21.0 01/25/2017: ALT 14; Magnesium 2.1 01/26/2017: BUN 12; Creatinine, Ser 1.25; Potassium 4.2; Sodium 135 01/28/2017: Hemoglobin 8.0; Platelets 388    Lipid Panel    Component Value Date/Time   CHOL 151 10/31/2015 1559   TRIG 136 10/31/2015 1559   HDL 41 10/31/2015 1559   CHOLHDL 3.7 10/31/2015 1559   VLDL 27 10/31/2015 1559   LDLCALC 83 10/31/2015 1559      Wt Readings from Last 3 Encounters:  01/28/17 87.1 kg (192 lb)  01/26/17 83.7 kg (184 lb 9.6 oz)  11/27/16 90.6 kg (199 lb 12.8 oz)       ASSESSMENT AND PLAN:  # Anemia:  # Recurrent GI bleed: Mr. Dome reports melena and hbg was 8.7-->7.6 in the hospital.  We will check a CBC today.  We will transfuse for hgb <7.  I instructed him to start taking iron one tablet bid.  I also recommended that he follow up with his gastroenterologist and PCP.  We will stop clopidogrel.  # Hypertension:  Blood pressure is poorly-controlled. He hasn't been taking his  BP medication due to orthostasis.  We will continue to hold lisinopril.  Reduce nebivolol to 20 mg from 40mg .  I have asked him to monitor his BP at home and bring a log to follow up.  Orthostasis will likely improve as his oral intake increases.  # Murmur: A systolic murmur was noted today.  This may be due to anemia.  Check ehco.  # CAD s/p LAD PCI:  # Hyperlipidemia: Chest pain resolved.  Stress test was negative for ischemia.  Continue aspirin and stop clopidogrel given his melena.  PCI was in 2016.  Continue aspirin and rosuvastatin 20 mg daily.  Check lipids and CMP at follow up.  # Leg numbness: # PAD: Mild PAD on the L and moderate on the R.  Repeat ABIs 10/2017.  Continue rosuvastatin and aspirin.  # Noncompliance: This continues to be a huge problem.  He has very poor insight and makes excuses for everything.  Will continue to reiterate the importance of taking meds, exercising and improving diet.  Once daily medications if at all possible.    Current medicines are reviewed at length with the patient today.  The patient does not have concerns regarding medicines.  The following changes have been made: Stop lisinopril.  Reduce nebivolol to 20mg  daily. Labs/ tests ordered today include:   Orders Placed This Encounter  Procedures  . CBC with Differential/Platelet  . ECHOCARDIOGRAM COMPLETE     Disposition:   FU with Shamonique Battiste C. Oval Linsey, MD, [facility] in 1 week.   Time spent: 45 minutes-Greater than 50% of this time was spent in counseling, explanation of diagnosis,  planning of further management, and coordination of care.   This note was written with the assistance of speech recognition software.  Please excuse any transcriptional errors.  Signed, Hendrix Yurkovich C. Oval Linsey, MD, [facility]  01/30/2017 11:59 PM    [place]

## 2017-01-28 NOTE — Telephone Encounter (Signed)
Per pharmacy please clarify if you want Dukes formula or are you using your own recipe send new RX

## 2017-01-28 NOTE — Patient Instructions (Addendum)
Medication Instructions:  START YOUR FERROUS SULFATE (IRON) TWICE A DAY  STOP LISINOPRIL   DECREASE YOUR BYSTOLIC TO 20 MG DAILY   Labwork: CBC TODAY   Testing/Procedures: Your physician has requested that you have an echocardiogram. Echocardiography is a painless test that uses sound waves to create images of your heart. It provides your doctor with information about the size and shape of your heart and how well your heart's chambers and valves are working. This procedure takes approximately one hour. There are no restrictions for this procedure. [address]  Follow-Up: Your physician recommends that you schedule a follow-up appointment in: [place] [facility]   If you need a refill on your cardiac medications before your next appointment, please call your pharmacy.

## 2017-01-28 NOTE — Telephone Encounter (Signed)
Spoke with pt dtr, aware blood work is not resulted yet. She ask for samples of bystolic., samples placed at the front desk for pick up

## 2017-01-28 NOTE — Telephone Encounter (Signed)
New message   Pt daughter verbalized that she is calling for the rn   From test he had today, she said she was told the rn will call her with the INR results

## 2017-01-28 NOTE — Telephone Encounter (Signed)
Thank you for letting me know. Which prescription is this regarding? If it is the magic mouthwash Dukes formula would be fine with me.

## 2017-01-29 LAB — CBC WITH DIFFERENTIAL/PLATELET
Basophils Absolute: 0.1 10*3/uL (ref 0.0–0.2)
Basos: 1 %
EOS (ABSOLUTE): 0.1 10*3/uL (ref 0.0–0.4)
Eos: 1 %
Hematocrit: 27 % — ABNORMAL LOW (ref 37.5–51.0)
Hemoglobin: 8 g/dL — ABNORMAL LOW (ref 13.0–17.7)
Immature Grans (Abs): 0 10*3/uL (ref 0.0–0.1)
Immature Granulocytes: 0 %
Lymphocytes Absolute: 0.9 10*3/uL (ref 0.7–3.1)
Lymphs: 10 %
MCH: 22.2 pg — ABNORMAL LOW (ref 26.6–33.0)
MCHC: 29.6 g/dL — ABNORMAL LOW (ref 31.5–35.7)
MCV: 75 fL — ABNORMAL LOW (ref 79–97)
Monocytes Absolute: 0.6 10*3/uL (ref 0.1–0.9)
Monocytes: 7 %
Neutrophils Absolute: 7.6 10*3/uL — ABNORMAL HIGH (ref 1.4–7.0)
Neutrophils: 81 %
Platelets: 388 10*3/uL — ABNORMAL HIGH (ref 150–379)
RBC: 3.61 x10E6/uL — ABNORMAL LOW (ref 4.14–5.80)
RDW: 19.5 % — ABNORMAL HIGH (ref 12.3–15.4)
WBC: 9.3 10*3/uL (ref 3.4–10.8)

## 2017-01-29 NOTE — Telephone Encounter (Signed)
This has been answered

## 2017-01-30 ENCOUNTER — Encounter: Payer: Self-pay | Admitting: Cardiovascular Disease

## 2017-02-04 ENCOUNTER — Telehealth: Payer: Self-pay | Admitting: Cardiovascular Disease

## 2017-02-04 ENCOUNTER — Ambulatory Visit: Payer: Medicare Other | Admitting: Cardiovascular Disease

## 2017-02-04 ENCOUNTER — Encounter: Payer: Self-pay | Admitting: Physician Assistant

## 2017-02-04 ENCOUNTER — Ambulatory Visit (INDEPENDENT_AMBULATORY_CARE_PROVIDER_SITE_OTHER): Payer: Medicare Other | Admitting: Physician Assistant

## 2017-02-04 VITALS — BP 144/68 | HR 66 | Ht 68.0 in | Wt 191.8 lb

## 2017-02-04 DIAGNOSIS — Z9119 Patient's noncompliance with other medical treatment and regimen: Secondary | ICD-10-CM | POA: Diagnosis not present

## 2017-02-04 DIAGNOSIS — D509 Iron deficiency anemia, unspecified: Secondary | ICD-10-CM | POA: Diagnosis not present

## 2017-02-04 DIAGNOSIS — I739 Peripheral vascular disease, unspecified: Secondary | ICD-10-CM | POA: Diagnosis not present

## 2017-02-04 DIAGNOSIS — I2510 Atherosclerotic heart disease of native coronary artery without angina pectoris: Secondary | ICD-10-CM | POA: Diagnosis not present

## 2017-02-04 DIAGNOSIS — I10 Essential (primary) hypertension: Secondary | ICD-10-CM | POA: Diagnosis not present

## 2017-02-04 DIAGNOSIS — E119 Type 2 diabetes mellitus without complications: Secondary | ICD-10-CM

## 2017-02-04 DIAGNOSIS — Z91199 Patient's noncompliance with other medical treatment and regimen due to unspecified reason: Secondary | ICD-10-CM

## 2017-02-04 MED ORDER — AMLODIPINE BESYLATE 5 MG PO TABS: 5.0000 mg | ORAL_TABLET | Freq: Every day | ORAL | 3 refills | Status: DC

## 2017-02-04 NOTE — Telephone Encounter (Signed)
Will forward to melinda to be aware to watch for the paperwork

## 2017-02-04 NOTE — Progress Notes (Signed)
Cardiology Office Note    Date:  02/04/2017   ID:  Jared, Green 1945/12/30, MRN [id number]  PCP:  [name], MD  Cardiologist:  Dr. Oval Linsey   Chief Complaint  Patient presents with  . Follow-up    seen for Dr. Oval Linsey, BP med titration    History of Present Illness:  [name] is a 71 y.o. male with PMH of CAD s/p RCA PCI 2016, PAD s/p stenting, HTN, poorly controlled DM, and noncompliance. He also had a history of GI bleed. He was hospitalized in July 2017 with melena, hemoglobin was 7.1 at the time. He declined further workup and restart her Plavix on his own. He eventually underwent upper and lower endoscopy and found to have diverticulosis but no source of bleeding. No bleeding was found on Endoscopy at [facility] either. Per family, he has a history of noncompliance. He had a Lexiscan Myoview on 10/24/2016 which showed EF 49%, no ischemia. He also had ABI that revealed greater than 50% right distal SFA stenosis status post angioplasty and the 30-49% left mid to distal superficial SFA stenosis. One year follow-up was recommended. He was admitted to the hospital in June 2018 with presyncope. This was thought to be due to orthostatic hypotension versus vasovagal as the episode occurred after taking a hot shower. He had stopped taking off his diabetes medication and had poor oral intake. Troponin was mildly elevated at 0.05. EKG showed no ischemia. His hemoglobin has been low again on recent hospitalization between 5.6-8.7. He was instructed to take iron therapy but has not. He also has melena and no hematochezia.  He was last seen by Dr. Oval Linsey on 01/28/2017, his lisinopril was discontinued. Bystolic was decreased to 20 mg daily. He was started on iron therapy twice a day. It was also recommended for him to repeat echocardiogram which is scheduled in 7 days. He presents today for cardiology office visit. He did bring a blood pressure diary. He denies any discomfort. Blood pressure  diary indicate his blood pressure has been ranging in the upper 130s to low 150s. He says he has been compliant with nebivolol. I recommended addition of 5 mg amlodipine for blood pressure control. I will bring him back in 5 weeks for follow-up and also repeat CBC at the time to make sure his anemia is improving.   Past Medical History:  Diagnosis Date  . Bleeding gastric ulcer 2000's  . Chronic anemia   . Coronary artery disease   . GERD (gastroesophageal reflux disease)   . History of blood transfusion 2000's   "related to bleeding gastric ulcer"  . Hyperlipidemia   . Hypertension   . PVD (peripheral vascular disease) ([place])   . S/P coronary artery stent placement 10/03/2016  . Type II diabetes mellitus ([place])     Past Surgical History:  Procedure Laterality Date  . CARDIAC CATHETERIZATION N/A 04/24/2015   Procedure: Left Heart Cath and Coronary Angiography;  Surgeon: [name], MD;  Location: [place] [facility];  Service: Cardiovascular;  Laterality: N/A;  . CARDIAC CATHETERIZATION N/A 04/24/2015   Procedure: Coronary Stent Intervention;  Surgeon: [name], MD;  Location: [place] [facility];  Service: Cardiovascular;  Laterality: N/A;  Prox RCA  . CARDIAC CATHETERIZATION Right 05/29/2015   Procedure: Angioplasty Perioneal and SFA;  Surgeon: [name], MD;  Location: [place] [facility];  Service: Cardiovascular;  Laterality: Right;  . COLONOSCOPY Left 02/29/2016   Procedure: COLONOSCOPY;  Surgeon: Carol Ada, MD;  Location:  [facility];  Service: Endoscopy;  Laterality: Left;  . COLONOSCOPY WITH PROPOFOL N/A 11/23/2015   Procedure: COLONOSCOPY WITH PROPOFOL;  Surgeon: Carol Ada, MD;  Location: [facility];  Service: Endoscopy;  Laterality: N/A;  . CORONARY ANGIOPLASTY    . ESOPHAGOGASTRODUODENOSCOPY (EGD) WITH PROPOFOL N/A 11/23/2015   Procedure: ESOPHAGOGASTRODUODENOSCOPY (EGD) WITH PROPOFOL;  Surgeon: Carol Ada, MD;  Location: [facility];  Service: Endoscopy;  Laterality: N/A;    . EXCISIONAL HEMORRHOIDECTOMY  1980's  . GIVENS CAPSULE STUDY N/A 02/29/2016   Procedure: GIVENS CAPSULE STUDY;  Surgeon: Carol Ada, MD;  Location: [facility];  Service: Endoscopy;  Laterality: N/A;  . PERIPHERAL VASCULAR CATHETERIZATION N/A 04/24/2015   Procedure: Lower Extremity Angiography;  Surgeon: [name], MD;  Location: [place] [facility];  Service: Cardiovascular;  Laterality: N/A;  . PERIPHERAL VASCULAR CATHETERIZATION N/A 05/29/2015   Procedure: Lower Extremity Angiography;  Surgeon: [name], MD;  Location: [place] [facility];  Service: Cardiovascular;  Laterality: N/A;  . PERIPHERAL VASCULAR CATHETERIZATION Left 05/29/2015   Procedure: Renal Angiography;  Surgeon: [name], MD;  Location: [place] [facility];  Service: Cardiovascular;  Laterality: Left;  . PERIPHERAL VASCULAR CATHETERIZATION Left 06/12/2015   Procedure: Peripheral Vascular Atherectomy;  Surgeon: [name], MD;  Location: [place] [facility];  Service: Cardiovascular;  Laterality: Left;  AT  . PERIPHERAL VASCULAR CATHETERIZATION Left 06/12/2015   Procedure: Peripheral Vascular Intervention;  Surgeon: [name], MD;  Location: [place] [facility];  Service: Cardiovascular;  Laterality: Left;  PTA TP TRUNK  . UPPER GI ENDOSCOPY  12/16/11   findings:  mild gastritis and duodenitis    Current Medications: Outpatient Medications Prior to Visit  Medication Sig Dispense Refill  . aspirin 81 MG tablet Take 1 tablet (81 mg total) by mouth daily. 30 tablet 11  . ferrous sulfate 325 (65 FE) MG EC tablet Take 325 mg by mouth 2 (two) times daily with a meal.    . glimepiride (AMARYL) 4 MG tablet Take 1 tablet (4 mg total) by mouth daily before breakfast. Additional refills from PCP 30 tablet 0  . metFORMIN (GLUCOPHAGE-XR) 500 MG 24 hr tablet Take 1 tablet (500 mg total) by mouth daily with breakfast. 30 tablet 11  . rosuvastatin (CRESTOR) 20 MG tablet Take 1 tablet (20 mg total) by mouth daily. 30 tablet 2  . magic  mouthwash w/lidocaine SOLN Take 5 mLs by mouth 4 (four) times daily. 100 mL 0  . Nebivolol HCl (BYSTOLIC) 20 MG TABS Take 1 tablet (20 mg total) by mouth daily. 28 tablet 0   No facility-administered medications prior to visit.      Allergies:   Patient has no known allergies.   Social History   Social History  . Marital status: Married    Spouse name: N/A  . Number of children: N/A  . Years of education: N/A   Social History Main Topics  . Smoking status: Former Smoker    Packs/day: 1.00    Years: 54.00    Types: Cigars, Cigarettes    Quit date: 02/16/2015  . Smokeless tobacco: Former Systems developer    Types: Chew     Comment: "chewed some when I was young"  . Alcohol use No     Comment: 04/24/2015 "no beer in 2 wks; ;no liquor in 3-4 weeks; I'm not a real heavy drinker" states he stopped drining 2 yrs ago  . Drug use: No  . Sexual activity: No   Other Topics Concern  .  None   Social History Narrative  . None     Family History:  The patient's family history includes Alcohol abuse in his father; Diabetes in his mother; Heart attack in his father; Hypertension in his father; Peripheral Artery Disease in his father.   ROS:   Please see the history of present illness.    ROS All other systems reviewed and are negative.   PHYSICAL EXAM:   VS:  BP (!) 144/68   Pulse 66   Ht 5\' 8"  (1.727 m)   Wt 191 lb 12.8 oz (87 kg)   BMI 29.16 kg/m    GEN: Well nourished, well developed, in no acute distress  HEENT: normal  Neck: no JVD, carotid bruits, or masses Cardiac: RRR; no rubs, or gallops, no edema  +7/5 systolic murmur Respiratory:  clear to auscultation bilaterally, normal work of breathing GI: soft, nontender, nondistended, + BS MS: no deformity or atrophy  Skin: warm and dry, no rash Neuro:  Alert and Oriented x 3, Strength and sensation are intact Psych: euthymic mood, full affect  Wt Readings from Last 3 Encounters:  02/04/17 191 lb 12.8 oz (87 kg)  01/28/17 192 lb (87.1  kg)  01/26/17 184 lb 9.6 oz (83.7 kg)      Studies/Labs Reviewed:   EKG:  EKG is not ordered today.   Recent Labs: 02/27/2016: B Natriuretic Peptide 21.0 01/25/2017: ALT 14; Magnesium 2.1 01/26/2017: BUN 12; Creatinine, Ser 1.25; Potassium 4.2; Sodium 135 01/28/2017: Hemoglobin 8.0; Platelets 388   Lipid Panel    Component Value Date/Time   CHOL 151 10/31/2015 1559   TRIG 136 10/31/2015 1559   HDL 41 10/31/2015 1559   CHOLHDL 3.7 10/31/2015 1559   VLDL 27 10/31/2015 1559   LDLCALC 83 10/31/2015 1559    Additional studies/ records that were reviewed today include:    Cath 04/24/2015 Conclusion   1. PTCA and stenting of the proximal dominant right coronary artery with 3.5 x 18 mm resolute integrity DES, 99% stenosis reduced to 0%. Mild to moderate disease in the proximal and mid circumflex and mild disease in the LAD and RCA, LVEF 55%. 2. Abdominal aortogram: No evidence of renal artery stenosis. Selective left renal arteriogram confirmed absence of renal artery stenosis. There was no evidence of abdominal aneurysm. Mild atherosclerotic changes. 3. Right SFA 99% stenosis in the distal segment, below the right knee one-vessel runoff in the form of peroneal artery. Midsegment of the peroneal artery has a focal 90% stenosis. The AT and PT reconstitute at the level of the ankle. 4. Left common femoral artery 40-50% stenosis. Left SFA in the distal segment 60-70% stenosis. Below the left knee two-vessel runoff with severely diseased AT with near occlusion in the distal segment and one-vessel peroneal runoff to the level of the ankle with mild disease.  Recommendation: Patient will be scheduled for elective angioplasty of the right SFA, might need angioplasty of the left SFA also depending on his symptoms. Patient has been started on Effient and will be continued on aspirin and Effient due to CAD and RCA stenting.      Cath 05/29/2015 Conclusion   Angiographic data: Selective left  renal arteriogram reveals widely patent renal artery.  right SFA 99% stenosis with one-vessel runoff in the form of peroneal artery, peroneal artery in the midsegment had a focal 90-95% stenosis. Anterior tibial and posterior tibial reconstitutes at the level of the ankle.  PTA and balloon angioplasty with a 6.0 x 1 20 mm chocolate  balloon followed by 6.0 x 1 50 mm drug-coated balloon angioplasty with Lutonix.  PTA and balloon angioplasty of the left peroneal artery with a 3.0 x 100 mm Saber. SFA stenosis reduced from 99% to less than 5-10% and peroneal artery stenosis reduced from 90% to less than 5-10% with brisk flow.  Patient has significant disease in the left, anterior tibial is diffusely diseased with peroneal artery being the single vessel runoff. The anterior tibial appears to be amenable for angioplasty and salvage, hence patient could benefit from elective repeat procedure especially if he continues to have claudication of the left leg      ASSESSMENT:    1. Essential hypertension   2. Iron deficiency anemia, unspecified iron deficiency anemia type   3. Coronary artery disease involving native coronary artery of native heart without angina pectoris   4. Controlled type 2 diabetes mellitus without complication, without long-term current use of insulin ([place])   5. Noncompliance   6. PAD (peripheral artery disease) ([facility])      PLAN:  In order of problems listed above:  1. Hypertension: Side effect associated with carvedilol, recently switched to nebivolol, nebivolol dose was decreased to 20 mg daily and lisinopril was discontinued. He has been compliant with nebivolol for the past 5 days since he started on the lower dose. He did bring up blood pressure diary with him, his systolic blood pressure has been ranging in the 130s to 150s. I added 5 mg amlodipine to his medication. He will continue to monitor daily blood pressure. He will bring his medication bottles with him and also  his blood pressure cuff with him for the next office visit. He is pending echocardiogram for heart murmur in one week.  2. CAD s/p PCI to RCA in 2016: No obvious chest pain.  3. PAD: S/p stenting in 2016 as well. No obvious leg pain.  4. Uncontrolled type 2 diabetes mellitus: Currently on metformin and glymepiride, hemoglobin A1c was > 10, will defer to primary care physician for further medication adjustment.  5. History of noncompliance: seems like he has been more compliant in the last week regarding his blood pressure medications.  6. Anemia: Baseline hemoglobin around 8.0, plan to repeat CBC in 5-6 weeks after initiation of iron therapy    Medication Adjustments/Labs and Tests Ordered: Current medicines are reviewed at length with the patient today.  Concerns regarding medicines are outlined above.  Medication changes, Labs and Tests ordered today are listed in the Patient Instructions below. Patient Instructions  Medication Instructions: START Amlodipine 5 mg tablet daily  Labwork: Please have the following lab drawn in 5 weeks at your next follow up: CBC   Follow-Up: Your physician recommends that you schedule a follow-up appointment in: 5 weeks with Dr. Oval Linsey or Almyra Deforest, [place].   Any Additional Special Instructions Will Be Listed Below (If Applicable).  At your next appointment please bring all your medication bottles Please continue to keep a daily log of your blood pressures and bring to your next appointment. Please bring your blood pressure cuff to your next appointment.     If you need a refill on your cardiac medications before your next appointment, please call your pharmacy.      Hilbert Corrigan, [place]  02/04/2017 9:26 AM    [address], [place], [place]  [address] Phone: [phone]; Fax: [phone]

## 2017-02-04 NOTE — Patient Instructions (Addendum)
Medication Instructions: START Amlodipine 5 mg tablet daily  Labwork: Please have the following lab drawn in 5 weeks at your next follow up: CBC   Follow-Up: Your physician recommends that you schedule a follow-up appointment in: 5 weeks with Dr. Oval Linsey or Almyra Deforest, [place].   Any Additional Special Instructions Will Be Listed Below (If Applicable).  At your next appointment please bring all your medication bottles Please continue to keep a daily log of your blood pressures and bring to your next appointment. Please bring your blood pressure cuff and monitor to your next appointment.     If you need a refill on your cardiac medications before your next appointment, please call your pharmacy.

## 2017-02-04 NOTE — Telephone Encounter (Signed)
Patient daughter [name]) calling, states that she is faxing over some paperwork for Dr. Oval Linsey in regards to the patient assistance program for the Bystolic medication and states that she would also need a copy of the prescription to turn in. [name] states that she would like to stop by office to pick up paperwork whenever it is completed (is not expecting it to be completed today.) Thanks.

## 2017-02-09 NOTE — Telephone Encounter (Signed)
Follow up    Pt daughter is calling asking for a call back about paper work that she faxed over. Please call.

## 2017-02-09 NOTE — Telephone Encounter (Signed)
Advised papers waiting for signature, will call when completed Samples of Bystolic 20 mg at front desk #4 for pick up

## 2017-02-11 ENCOUNTER — Ambulatory Visit (HOSPITAL_COMMUNITY): Payer: Medicare Other | Attending: Cardiovascular Disease

## 2017-02-11 ENCOUNTER — Other Ambulatory Visit: Payer: Self-pay

## 2017-02-11 DIAGNOSIS — R011 Cardiac murmur, unspecified: Secondary | ICD-10-CM | POA: Diagnosis not present

## 2017-02-11 DIAGNOSIS — I340 Nonrheumatic mitral (valve) insufficiency: Secondary | ICD-10-CM | POA: Diagnosis not present

## 2017-02-11 DIAGNOSIS — I10 Essential (primary) hypertension: Secondary | ICD-10-CM | POA: Insufficient documentation

## 2017-02-11 DIAGNOSIS — I371 Nonrheumatic pulmonary valve insufficiency: Secondary | ICD-10-CM | POA: Insufficient documentation

## 2017-02-11 DIAGNOSIS — D649 Anemia, unspecified: Secondary | ICD-10-CM | POA: Diagnosis not present

## 2017-02-11 DIAGNOSIS — E119 Type 2 diabetes mellitus without complications: Secondary | ICD-10-CM | POA: Diagnosis not present

## 2017-02-11 DIAGNOSIS — Z87891 Personal history of nicotine dependence: Secondary | ICD-10-CM | POA: Diagnosis not present

## 2017-02-11 DIAGNOSIS — I493 Ventricular premature depolarization: Secondary | ICD-10-CM | POA: Insufficient documentation

## 2017-02-16 MED ORDER — NEBIVOLOL HCL 20 MG PO TABS: 20.0000 mg | ORAL_TABLET | Freq: Every day | ORAL | 3 refills | Status: DC

## 2017-02-17 NOTE — Telephone Encounter (Signed)
No answer, will try again.

## 2017-02-19 ENCOUNTER — Telehealth: Payer: Self-pay

## 2017-02-19 NOTE — Telephone Encounter (Signed)
Pt here to p/u samples Bystolic, samples Samples of this drug were given to the patient, quantity 4, Lot Number [id number] exp 07-2018 logged in sample book. Medication Samples have been provided to the patient.  Drug name: Bystolic       Strength: 20mg         Qty: 4 LOT: [id number]  Exp.Date: 07-2018   Dosing instructions: one tablet daily  The patient has been instructed regarding the correct time, dose, and frequency of taking this medication, including desired effects and most common side effects.   Waylan Rocher 8:22 AM 02/19/2017

## 2017-02-20 NOTE — Telephone Encounter (Signed)
Advised daughter forms ready for pick up for patient portion to be filled out

## 2017-02-27 ENCOUNTER — Telehealth: Payer: Self-pay | Admitting: Cardiovascular Disease

## 2017-02-27 NOTE — Telephone Encounter (Signed)
Closed Encounter  °

## 2017-03-13 ENCOUNTER — Telehealth: Payer: Self-pay | Admitting: Cardiovascular Disease

## 2017-03-13 NOTE — Telephone Encounter (Signed)
Spoke with [employer] conservator/restorer Patient Assistance for [employer]) Informed her that on July 6, paperwork was left for daughter to pick up to complete patient portion Patient/family submitted application and they are calling to verify that MD wants patient to receive medication - Bystolic 20mg  Advised that this is correct - they will process application.

## 2017-03-13 NOTE — Telephone Encounter (Signed)
New message    Jeani Hawking is calling about the application for the pt about the medication. She said it did not come from the doctors office and they need confirmation to process it. Please call.

## 2017-03-16 ENCOUNTER — Encounter: Payer: Self-pay | Admitting: Cardiovascular Disease

## 2017-03-16 ENCOUNTER — Ambulatory Visit (INDEPENDENT_AMBULATORY_CARE_PROVIDER_SITE_OTHER): Payer: Medicare Other | Admitting: Cardiovascular Disease

## 2017-03-16 VITALS — BP 166/79 | HR 56 | Ht 68.0 in | Wt 191.6 lb

## 2017-03-16 DIAGNOSIS — Z9119 Patient's noncompliance with other medical treatment and regimen: Secondary | ICD-10-CM

## 2017-03-16 DIAGNOSIS — I10 Essential (primary) hypertension: Secondary | ICD-10-CM

## 2017-03-16 DIAGNOSIS — Z91199 Patient's noncompliance with other medical treatment and regimen due to unspecified reason: Secondary | ICD-10-CM

## 2017-03-16 DIAGNOSIS — K2961 Other gastritis with bleeding: Secondary | ICD-10-CM | POA: Diagnosis not present

## 2017-03-16 DIAGNOSIS — I2510 Atherosclerotic heart disease of native coronary artery without angina pectoris: Secondary | ICD-10-CM | POA: Diagnosis not present

## 2017-03-16 DIAGNOSIS — I739 Peripheral vascular disease, unspecified: Secondary | ICD-10-CM | POA: Diagnosis not present

## 2017-03-16 MED ORDER — CHLORTHALIDONE 25 MG PO TABS: ORAL_TABLET | ORAL | 5 refills | Status: DC

## 2017-03-16 MED ORDER — CHLORTHALIDONE 25 MG PO TABS: 25.0000 mg | ORAL_TABLET | Freq: Every day | ORAL | 5 refills | Status: DC

## 2017-03-16 MED ORDER — BISOPROLOL FUMARATE 5 MG PO TABS: 5.0000 mg | ORAL_TABLET | Freq: Every day | ORAL | 5 refills | Status: DC

## 2017-03-16 MED ORDER — FERROUS SULFATE 325 (65 FE) MG PO TBEC: 325.0000 mg | DELAYED_RELEASE_TABLET | Freq: Two times a day (BID) | ORAL | 5 refills | Status: DC

## 2017-03-16 NOTE — Patient Instructions (Addendum)
Medication Instructions:  STOP BYSTOLIC   START CHLORTHALIDONE 25 MG 1/2 TABLET DAILY   START BISOPROLOL 5 MG DAILY   Labwork: NONE  Testing/Procedures: NONE  Follow-Up: Your physician recommends that you schedule a follow-up appointment in: [place] 2 WEEKS FOR BLOOD PRESSURE  Your physician recommends that you schedule a follow-up appointment in: [place] [address] [facility]   If you need a refill on your cardiac medications before your next appointment, please call your pharmacy.

## 2017-03-16 NOTE — Progress Notes (Signed)
Cardiology Office Note     Date:  03/16/2017   ID:  [name] [birth year], MRN [id number]  PCP:  [name], MD  Cardiologist:   Skeet Latch, MD   Chief Complaint  Patient presents with  . Follow-up     History of Present Illness: [name] is a 71 y.o. male with CAD s/p LAD PCI, PAD s/p peripheral stenting, hypertension, poorly-controlled diabetes and non-compliance who presents for follow up.  He was first seen 09/2016. He was hospitalized 02/2016 with melena.   His hemoglobin was 7.1 at the time.  He declined work up and restarted Plavix on his own.  He underwent upper and lower endoscopy and was found to have diverticulosis but no source of bleeding.  No bleeding was found on capsule endoscopy at [facility].  He reports that an exploratory surgery was recommended but he declined.  He has decided to treat himself with increased fiber instead.  He ultimately restarted Plavix and denies recurrent bleeding.  Per noted with his PCP on 09/16/16 he admitted to missing several doses of clopidogrel.  However today he reports that he "never misses meds."   Per a letter from his daughter and talking with his wife he is very noncompliant with all medications. His most recent hemoblobin A1c is 10.2%.  [name] was previously a patient of Dr. [name]. He was instructed to follow up with Dr. [name] prior to restarting Plavix but never did.  [name] was referred for [facility] 10/24/16 that revealed LVEF 49% with no ischemia and global hypokinesis. He was referred for an echocardiogram which has not yet occurred. He also had ABIs that revealed greater than 50% right distal SFA stenosis status post angioplasty and 30-49% left mid to distal superficial SFA stenosis. One year follow-up was recommended.  At his last appointment [name] reported presyncope so he stopped taking all antihypertensives.  Nebivolol was reduced to 20mg  daily.  He also reported melena so clopidogrel was  discontinued.  A CBC showed that his hemoglobin was 8, which was up from 7.6 earlier that week. Since then he denies any recurrent melena or hematochezia.  He was noted to have a systolic murmur and had an echocardiogram 02/11/17 revealed LVEF 60-65% with grade 1 diastolic dysfunction. He also had moderate left ventricular hypertrophy.  He reports that his blood pressure has been labile.  He doesn't recall the exact values that he has seen.  He reports taking his medication as prescribed.  However he started varying the time of his nebivolol because his BP wasn't at goal.  He denies chest pain or shortness of breath.  He walks daily for exercise. He denies any exertional symptoms.  Past Medical History:  Diagnosis Date  . Bleeding gastric ulcer 2000's  . Chronic anemia   . Coronary artery disease   . GERD (gastroesophageal reflux disease)   . History of blood transfusion 2000's   "related to bleeding gastric ulcer"  . Hyperlipidemia   . Hypertension   . PVD (peripheral vascular disease) ([place])   . S/P coronary artery stent placement 10/03/2016  . Type II diabetes mellitus ([place])     Past Surgical History:  Procedure Laterality Date  . CARDIAC CATHETERIZATION N/A 04/24/2015   Procedure: Left Heart Cath and Coronary Angiography;  Surgeon: [name], MD;  Location: [place] [facility];  Service: Cardiovascular;  Laterality: N/A;  . CARDIAC CATHETERIZATION N/A 04/24/2015   Procedure: Coronary Stent Intervention;  Surgeon: [name], MD;  Location: [place] [facility];  Service: Cardiovascular;  Laterality: N/A;  Prox RCA  . CARDIAC CATHETERIZATION Right 05/29/2015   Procedure: Angioplasty Perioneal and SFA;  Surgeon: [name], MD;  Location: [place] [facility];  Service: Cardiovascular;  Laterality: Right;  . COLONOSCOPY Left 02/29/2016   Procedure: COLONOSCOPY;  Surgeon: Carol Ada, MD;  Location: [facility];  Service: Endoscopy;  Laterality: Left;  . COLONOSCOPY WITH PROPOFOL N/A 11/23/2015    Procedure: COLONOSCOPY WITH PROPOFOL;  Surgeon: Carol Ada, MD;  Location: [facility];  Service: Endoscopy;  Laterality: N/A;  . CORONARY ANGIOPLASTY    . ESOPHAGOGASTRODUODENOSCOPY (EGD) WITH PROPOFOL N/A 11/23/2015   Procedure: ESOPHAGOGASTRODUODENOSCOPY (EGD) WITH PROPOFOL;  Surgeon: Carol Ada, MD;  Location: [facility];  Service: Endoscopy;  Laterality: N/A;  . EXCISIONAL HEMORRHOIDECTOMY  1980's  . GIVENS CAPSULE STUDY N/A 02/29/2016   Procedure: GIVENS CAPSULE STUDY;  Surgeon: Carol Ada, MD;  Location: [facility];  Service: Endoscopy;  Laterality: N/A;  . PERIPHERAL VASCULAR CATHETERIZATION N/A 04/24/2015   Procedure: Lower Extremity Angiography;  Surgeon: [name], MD;  Location: [facility];  Service: Cardiovascular;  Laterality: N/A;  . PERIPHERAL VASCULAR CATHETERIZATION N/A 05/29/2015   Procedure: Lower Extremity Angiography;  Surgeon: [name], MD;  Location: [place] [facility];  Service: Cardiovascular;  Laterality: N/A;  . PERIPHERAL VASCULAR CATHETERIZATION Left 05/29/2015   Procedure: Renal Angiography;  Surgeon: [name], MD;  Location: [place] [facility];  Service: Cardiovascular;  Laterality: Left;  . PERIPHERAL VASCULAR CATHETERIZATION Left 06/12/2015   Procedure: Peripheral Vascular Atherectomy;  Surgeon: [name], MD;  Location: [place] [facility];  Service: Cardiovascular;  Laterality: Left;  AT  . PERIPHERAL VASCULAR CATHETERIZATION Left 06/12/2015   Procedure: Peripheral Vascular Intervention;  Surgeon: [name], MD;  Location: [place] [facility];  Service: Cardiovascular;  Laterality: Left;  PTA TP TRUNK  . UPPER GI ENDOSCOPY  12/16/11   findings:  mild gastritis and duodenitis     Current Outpatient Prescriptions  Medication Sig Dispense Refill  . amLODipine (NORVASC) 5 MG tablet Take 1 tablet (5 mg total) by mouth daily. 90 tablet 3  . aspirin 81 MG tablet Take 1 tablet (81 mg total) by mouth daily. 30 tablet 11  . ferrous sulfate 325 (65 FE)  MG EC tablet Take 325 mg by mouth 2 (two) times daily with a meal.    . glimepiride (AMARYL) 4 MG tablet Take 1 tablet (4 mg total) by mouth daily before breakfast. Additional refills from PCP 30 tablet 0  . metFORMIN (GLUCOPHAGE-XR) 500 MG 24 hr tablet Take 1 tablet (500 mg total) by mouth daily with breakfast. 30 tablet 11  . Nebivolol HCl (BYSTOLIC) 20 MG TABS Take 1 tablet (20 mg total) by mouth daily. 90 tablet 3  . rosuvastatin (CRESTOR) 20 MG tablet Take 1 tablet (20 mg total) by mouth daily. 30 tablet 2   No current facility-administered medications for this visit.     Allergies:   Patient has no known allergies.    Social History:  The patient  reports that he quit smoking about 2 years ago. His smoking use included Cigars and Cigarettes. He has a 54.00 pack-year smoking history. He has quit using smokeless tobacco. His smokeless tobacco use included Chew. He reports that he does not drink alcohol or use drugs.   Family History:  The patient's family history includes Alcohol abuse in his father; CVA in his unknown relative; Diabetes in his mother; Heart attack  in his father; Hypertension in his father and unknown relative; Peripheral Artery Disease in his father.    ROS:  Please see the history of present illness.   Otherwise, review of systems are positive for none.   All other systems are reviewed and negative.    PHYSICAL EXAM: VS:  BP (!) 166/79   Pulse (!) 56   Ht 5\' 8"  (1.727 m)   Wt 86.9 kg (191 lb 9.6 oz)   BMI 29.13 kg/m  , BMI Body mass index is 29.13 kg/m. GENERAL:  Well appearing HEENT: Pupils equal round and reactive, fundi not visualized, oral mucosa unremarkable NECK:  No jugular venous distention, waveform within normal limits, carotid upstroke brisk and symmetric, no bruits, no thyromegaly LUNGS:  Clear to auscultation bilaterally.  No crackles, wheezes or rhonchi. HEART:  RRR.  PMI not displaced or sustained,S1 and S2 within normal limits, no S3, no S4, no  clicks, no rubs, no murmurs ABD:  Flat, positive bowel sounds normal in frequency in pitch, no bruits, no rebound, no guarding, no midline pulsatile mass, no hepatomegaly, no splenomegaly EXT:  2 plus pulses throughout, no edema, no cyanosis no clubbing SKIN:  No rashes no nodules NEURO:  Cranial nerves II through XII grossly intact, motor grossly intact throughout PSYCH:  Cognitively intact, oriented to person place and time   EKG:  EKG is not ordered today.  Echo 6/27/8: Study Conclusions  - Left ventricle: The cavity size was normal. Wall thickness was   increased in a pattern of moderate LVH. Systolic function was   normal. The estimated ejection fraction was in the range of 60%   to 65%. Wall motion was normal; there were no regional wall   motion abnormalities. Doppler parameters are consistent with   abnormal left ventricular relaxation (grade 1 diastolic   dysfunction). - Left atrium: The atrium was mildly dilated. - Atrial septum: Mobile atrial septum no obvoius PFO consider f/u   bubble study if clinically indicated.  Lexiscan Myoview 10/24/16: The left ventricular ejection fraction is mildly decreased (45-54%).  Nuclear stress EF: 49%.  There was no ST segment deviation noted during stress.  The study is normal.  This is a low risk study.  ABI 10/21/16:  >50% right distal superficial femoral artery stenosis, s/p angioplasty. 30-49% left mid to distal superficial femoral artery stenosis. Three vessel run-off, bilaterally.   Recent Labs: 01/25/2017: ALT 14; Magnesium 2.1 01/26/2017: BUN 12; Creatinine, Ser 1.25; Potassium 4.2; Sodium 135 01/28/2017: Hemoglobin 8.0; Platelets 388    Lipid Panel    Component Value Date/Time   CHOL 151 10/31/2015 1559   TRIG 136 10/31/2015 1559   HDL 41 10/31/2015 1559   CHOLHDL 3.7 10/31/2015 1559   VLDL 27 10/31/2015 1559   LDLCALC 83 10/31/2015 1559      Wt Readings from Last 3 Encounters:  03/16/17 86.9 kg (191 lb 9.6  oz)  02/04/17 87 kg (191 lb 12.8 oz)  01/28/17 87.1 kg (192 lb)     ASSESSMENT AND PLAN:  # Hypertension:  Blood pressure is poorly-controlled. Mr. Ursin reports that his blood pressure has been labile. He also takes his blood pressures at different times each day. We discussed the importance of setting a regimen where he can take each pill at the same time daily. We will stop nebivolol due to cost concerns. Start bisoprolol 5 mg daily and chlorthalidone 12.5 mg daily.  Continue amlodipine.  His blood pressure cuff was checked against hours and was similar  in readings.   # Murmur: no valvular heart disease seen on echo.  # CAD s/p LAD PCI:  # Hyperlipidemia: Chest pain resolved.  Stress test was negative for ischemia.  Start bisoprolol as above. Continue aspirin and rosuvastatin 20 mg daily.  Check lipids and CMP at follow up.  # Leg numbness: # PAD: Mild PAD on the L and moderate on the R.  Repeat ABIs 10/2017.  Continue rosuvastatin and aspirin.  # Anemia:  # Recurrent GI bleed: H/H was stable on repeat.  He denies recurrent melena. Clopidogrel was discontinued. Continue iron supplementation.  He was again encouraged to follow up with GI.    Current medicines are reviewed at length with the patient today.  The patient does not have concerns regarding medicines.  The following changes have been made: Stop nebivolol.  Start chlorthalidone and bisoprolol.  Labs/ tests ordered today include:   No orders of the defined types were placed in this encounter.    Disposition:   FU with Jahni Nazar C. Oval Linsey, MD, [facility] in 3 months.  Pharmacy in 2 weeks.    This note was written with the assistance of speech recognition software.  Please excuse any transcriptional errors.  Signed, Tige Meas C. Oval Linsey, MD, [facility]  03/16/2017 1:39 PM    [place]

## 2017-03-25 IMAGING — CR DG CHEST 2V
2 series · 2 of 2 positions shown · non-contrast
Comparison: Chest radiograph dated 08/25/2010

CLINICAL DATA: 70-year-old male with shortness of breath

EXAM:
CHEST  2 VIEW

[w chest lat]
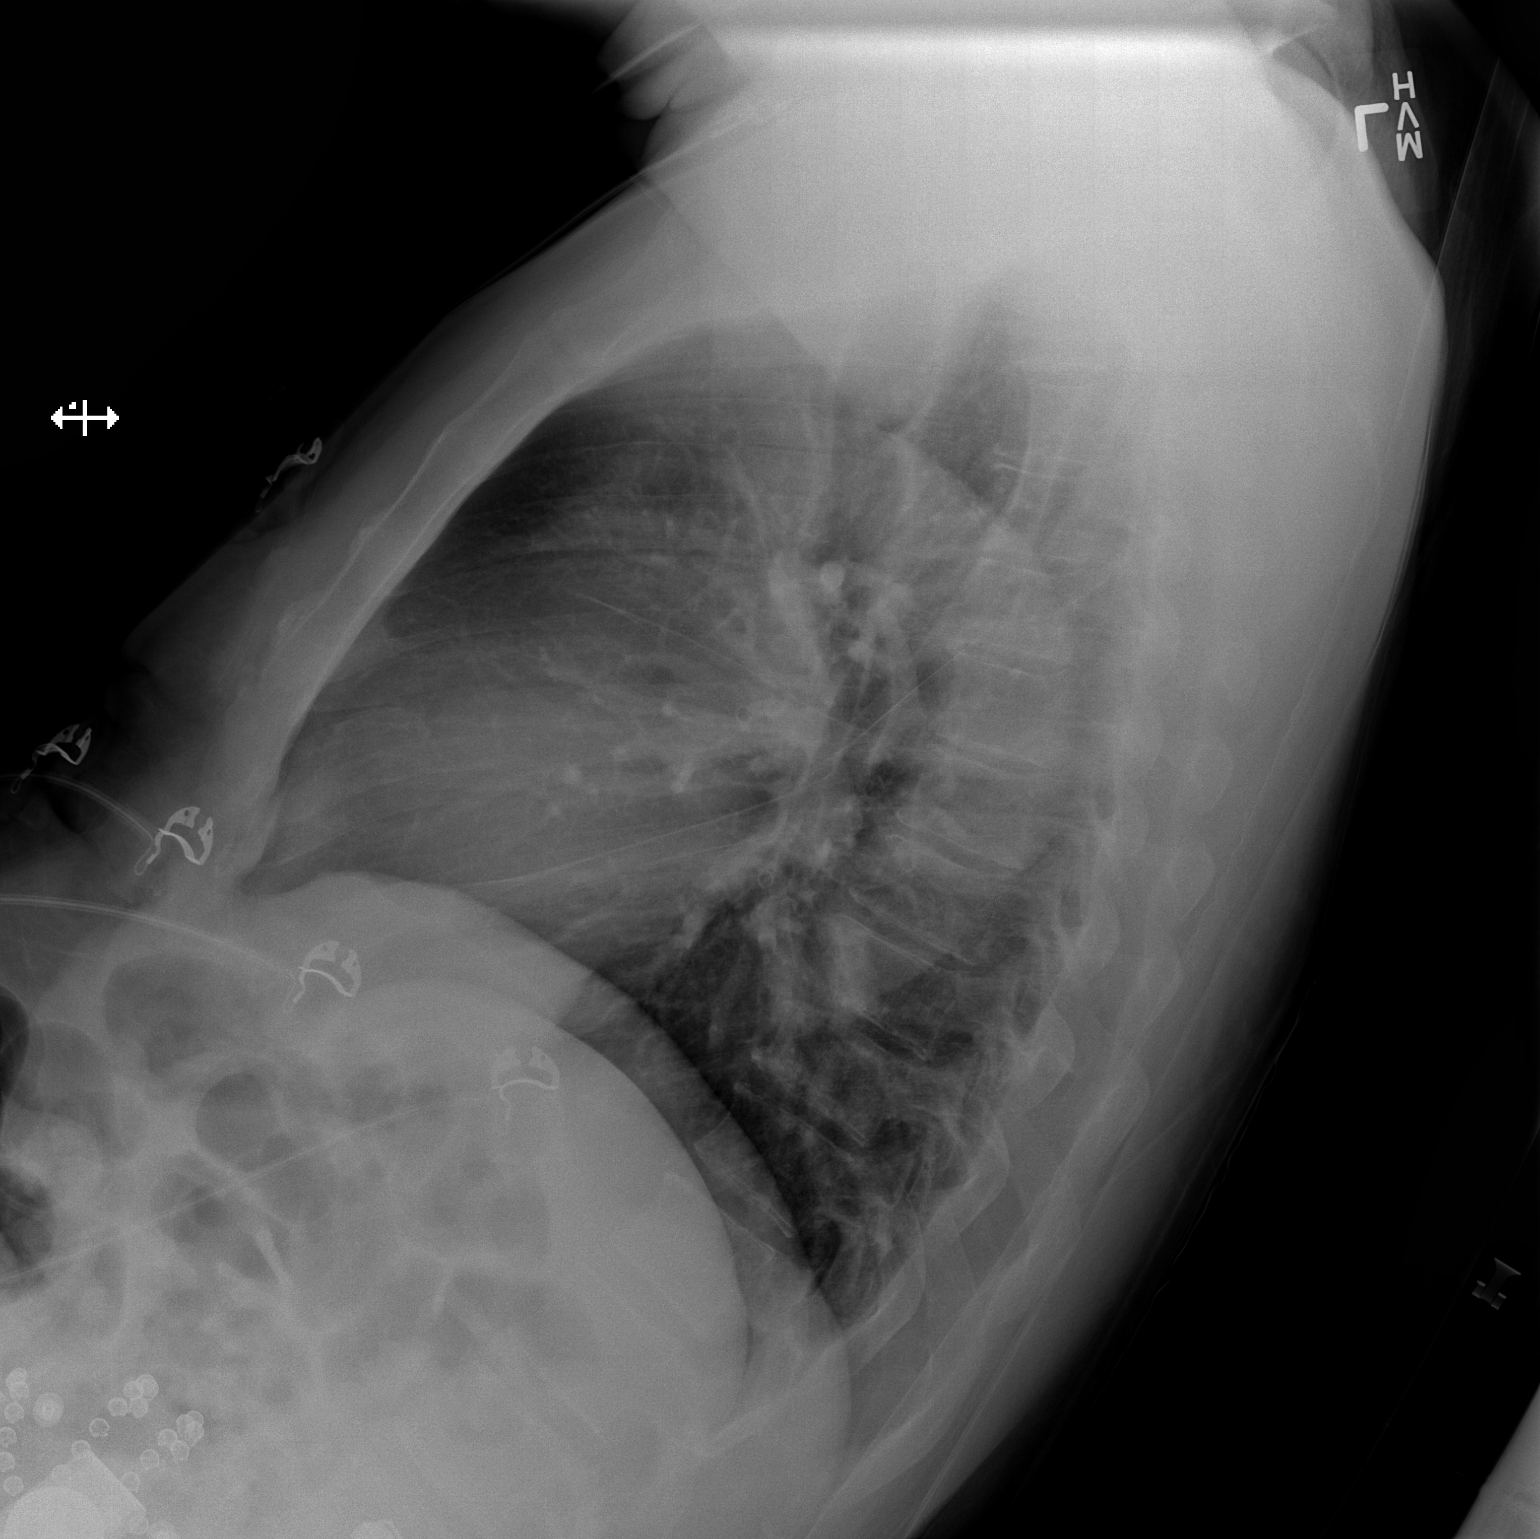

[x chest ap]
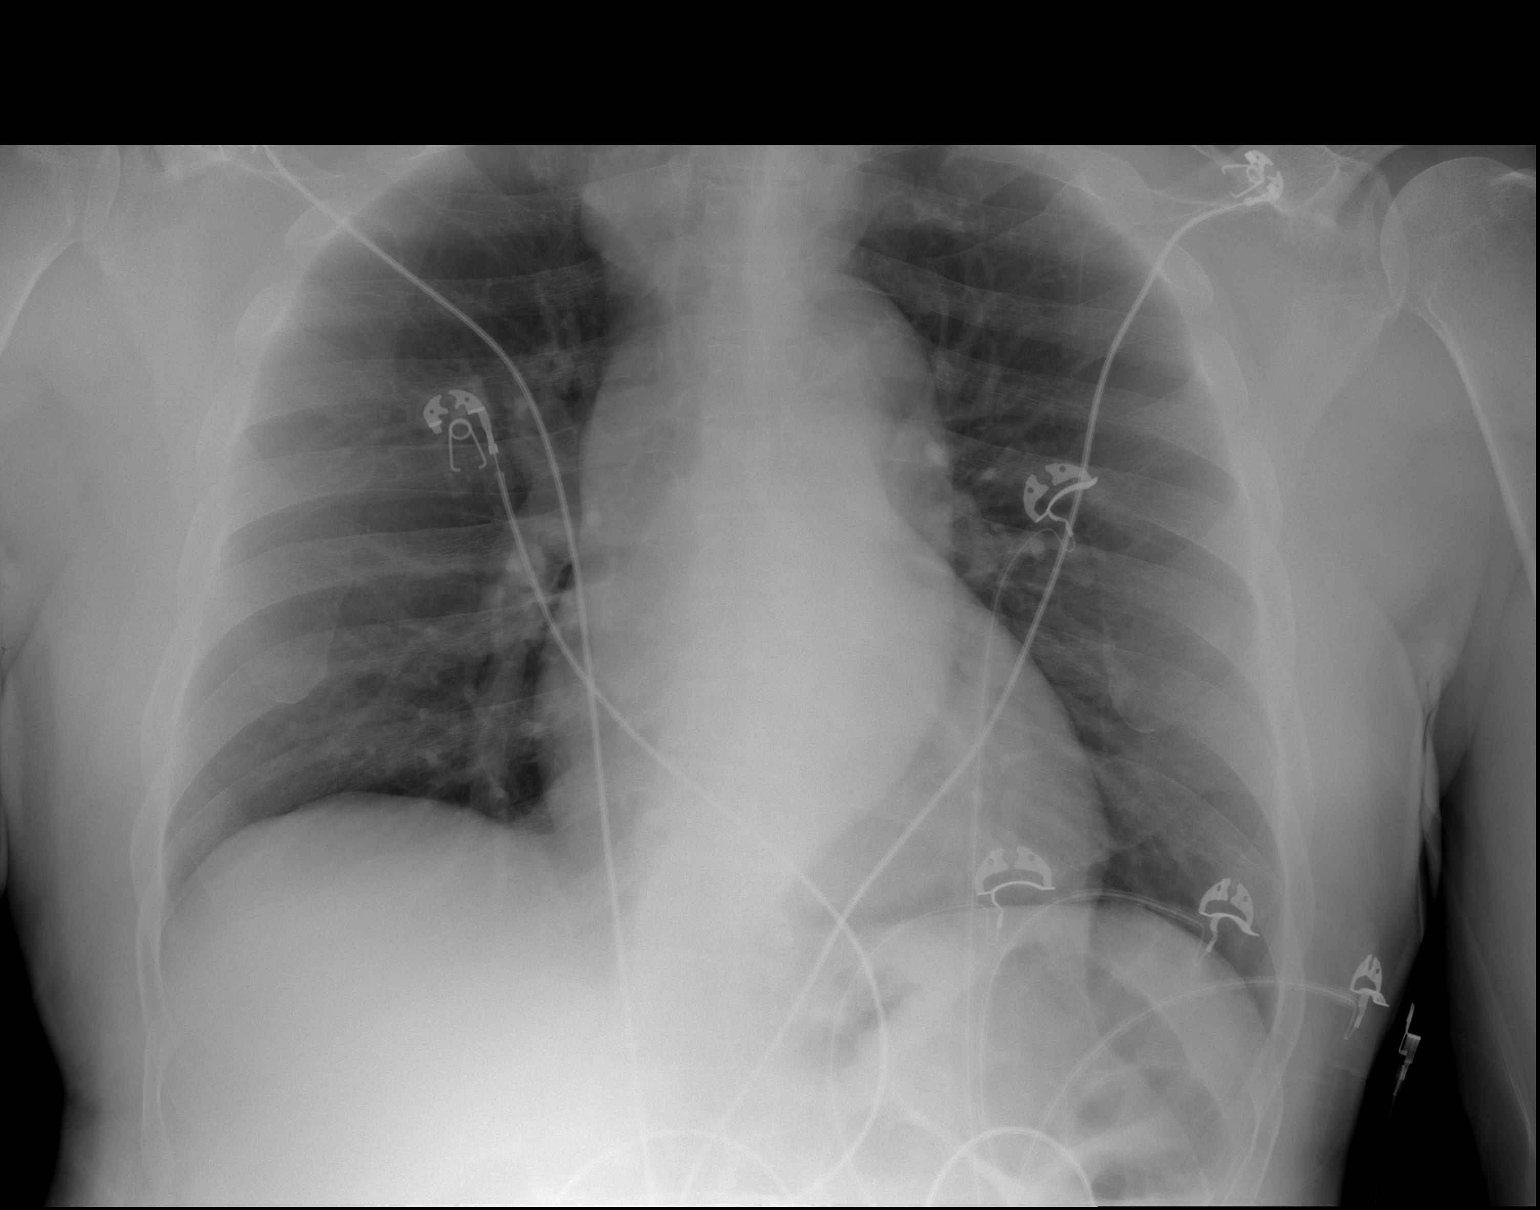

[2 of 2 positions shown; findings below may reference images not displayed]

FINDINGS: Two views of the chest demonstrate clear lungs. There is no pleural
effusion or pneumothorax the cardiac silhouette is within normal
limits. The aorta is tortuous. No acute osseous pathology.
IMPRESSION: No active cardiopulmonary disease.

## 2017-04-01 ENCOUNTER — Encounter: Payer: Self-pay | Admitting: Pharmacist Clinician (PhC)/ Clinical Pharmacy Specialist

## 2017-04-01 ENCOUNTER — Ambulatory Visit (INDEPENDENT_AMBULATORY_CARE_PROVIDER_SITE_OTHER): Payer: Medicare Other | Admitting: Pharmacist Clinician (PhC)/ Clinical Pharmacy Specialist

## 2017-04-01 DIAGNOSIS — I10 Essential (primary) hypertension: Secondary | ICD-10-CM | POA: Diagnosis not present

## 2017-04-01 DIAGNOSIS — I2510 Atherosclerotic heart disease of native coronary artery without angina pectoris: Secondary | ICD-10-CM | POA: Diagnosis not present

## 2017-04-01 NOTE — Progress Notes (Signed)
04/01/2017 [name] 07-06-1946 [id number]   HPI:  [name] is a 71 y.o. male patient of Dr Oval Linsey, with a [place] below who presents today for hypertension clinic evaluation. His medical history is significant for  CAD s/p LAD PCI; PAD s/p peripheral stent; hypertension, DM (A1c =10.2) and general non-compliance.  When he saw Dr. Oval Linsey on July 30, his BP was 166/79.   At that time she discontinued Bystolic because of cost issues.   He started bisoprolol 5 mg and chlorthalidone 12.5 mg daily and was to continue with the amlodipine.    Patient states he was into his mid 76's before he started taking any medications, and in general he doesn't like medications and would rather not take them unless he knew what they were "doing".  He takes all of his medications in the mornings, with the exception of rosuvastatin.  He takes that at bedtime because it makes him sleepy.     Blood Pressure Goal:  130/80  Current Medications:  Bisoprolol 5 mg qd  Chlorthalidone 12.5 mg qd  Amlodipine 5 mg qd  Family Hx:  Mother died at 41, father at 3; pt not sure of cause of death of either (father double amputee)  3 sisters DM, 1 "bad" - on insulin;    3 kids, no known issues by patient  Social Hx:  No tobacco, quit around 2010; drinks only on special occasions; coffee, sweet tea daily (cut coffee from 8-10 cups to 2-3 per/day  Diet:  Drinks lots of water, per patient a quart at a time; meals are mostly home cooked, rarely adds salt, states has cut back greatly over the past couple of years.  Avoids deep fried, except for occasional fish  Exercise:  Walk 1/16 -1/8 mile each am; takes 20 min depending on how often he stops to talk  golfs about 1-2 times per week  Home BP readings:  Has home cuff, but can't recall readings, doesn't know what's good or bad.  Does complain that they are always going up and down, but I'm not sure what range he is actually getting.    Intolerances:    NKDA  BMP 01/2017:   Na 135, K 4.2, glucose 255, SCr 1.25, GFR (African American) >60  Wt Readings from Last 3 Encounters:  03/16/17 191 lb 9.6 oz (86.9 kg)  02/04/17 191 lb 12.8 oz (87 kg)  01/28/17 192 lb (87.1 kg)   BP Readings from Last 3 Encounters:  04/01/17 (!) 142/76  03/16/17 (!) 166/79  02/04/17 (!) 144/68   Pulse Readings from Last 3 Encounters:  04/01/17 64  03/16/17 (!) 56  02/04/17 66    Current Outpatient Prescriptions  Medication Sig Dispense Refill  . amLODipine (NORVASC) 5 MG tablet Take 1 tablet (5 mg total) by mouth daily. 90 tablet 3  . aspirin 81 MG tablet Take 1 tablet (81 mg total) by mouth daily. 30 tablet 11  . bisoprolol (ZEBETA) 5 MG tablet Take 1 tablet (5 mg total) by mouth daily. 30 tablet 5  . chlorthalidone (HYGROTON) 25 MG tablet 1/2 TABLET BY MOUTH DAILY 15 tablet 5  . ferrous sulfate 325 (65 FE) MG EC tablet Take 1 tablet (325 mg total) by mouth 2 (two) times daily with a meal. 60 tablet 5  . glimepiride (AMARYL) 4 MG tablet Take 1 tablet (4 mg total) by mouth daily before breakfast. Additional refills from PCP 30 tablet 0  . metFORMIN (GLUCOPHAGE-XR) 500 MG 24 hr  tablet Take 1 tablet (500 mg total) by mouth daily with breakfast. 30 tablet 11  . rosuvastatin (CRESTOR) 20 MG tablet Take 1 tablet (20 mg total) by mouth daily. 30 tablet 2   No current facility-administered medications for this visit.     No Known Allergies  Past Medical History:  Diagnosis Date  . Bleeding gastric ulcer 2000's  . Chronic anemia   . Coronary artery disease   . GERD (gastroesophageal reflux disease)   . History of blood transfusion 2000's   "related to bleeding gastric ulcer"  . Hyperlipidemia   . Hypertension   . PVD (peripheral vascular disease) ([place])   . S/P coronary artery stent placement 10/03/2016  . Type II diabetes mellitus ([facility])     Blood pressure (!) 142/76, pulse 64.  Essential hypertension Patient with hypertension and poor  understanding of goals and medications.  We had a long discussion about what his medications do and the best time of day to take them.  This was spelled out on his AVS.  He is to check his BP at home twice daily for the next two weeks and write in on log.  He will bring the log and his cuff to next appointment.  I stressed not to worry about what the numbers are, but just to get them on paper and we will review together in 2 weeks.  Adjusted his amlodipine to evenings with rosuvastatin, aspirin and iron.  Once we get a better idea of what the home readings actually are, we can best determine a plan of action.  Patient appeared to agree to this plan.     Tommy Medal PharmD CPP [facility]

## 2017-04-01 NOTE — Patient Instructions (Addendum)
Return for a a follow up appointment in 2-3 weeks  Your blood pressure today is 142/76  (goal is < 130/80)  Check your blood pressure at home twice daily and keep record of the readings.  Take your BP meds as follows:  AM:      Amlodipine 5mg     Chlorthalidone 12.5 mg (1/2 tablet)   Glimeperide 4 mg   Metformin 500 mg   Iron 325 mg   PM:   Amlodipine 5 mg   Iron 325 mg   Rosuvastatin 20 mg   Aspirin 81 mg  Bring all of your meds, your BP cuff and your record of home blood pressures to your next appointment.  Exercise as you're able, try to walk approximately 30 minutes per day.  Keep salt intake to a minimum, especially watch canned and prepared boxed foods.  Eat more fresh fruits and vegetables and fewer canned items.  Avoid eating in fast food restaurants.    HOW TO TAKE YOUR BLOOD PRESSURE: . Rest 5 minutes before taking your blood pressure. .  Don't smoke or drink caffeinated beverages for at least 30 minutes before. . Take your blood pressure before (not after) you eat. . Sit comfortably with your back supported and both feet on the floor (don't cross your legs). . Elevate your arm to heart level on a table or a desk. . Use the proper sized cuff. It should fit smoothly and snugly around your bare upper arm. There should be enough room to slip a fingertip under the cuff. The bottom edge of the cuff should be 1 inch above the crease of the elbow. . Ideally, take 3 measurements at one sitting and record the average.

## 2017-04-01 NOTE — Assessment & Plan Note (Signed)
Patient with hypertension and poor understanding of goals and medications.  We had a long discussion about what his medications do and the best time of day to take them.  This was spelled out on his AVS.  He is to check his BP at home twice daily for the next two weeks and write in on log.  He will bring the log and his cuff to next appointment.  I stressed not to worry about what the numbers are, but just to get them on paper and we will review together in 2 weeks.  Adjusted his amlodipine to evenings with rosuvastatin, aspirin and iron.  Once we get a better idea of what the home readings actually are, we can best determine a plan of action.  Patient appeared to agree to this plan.

## 2017-04-15 ENCOUNTER — Ambulatory Visit (INDEPENDENT_AMBULATORY_CARE_PROVIDER_SITE_OTHER): Payer: Medicare Other | Admitting: Pharmacist

## 2017-04-15 VITALS — BP 118/62 | HR 62

## 2017-04-15 DIAGNOSIS — I10 Essential (primary) hypertension: Secondary | ICD-10-CM | POA: Diagnosis not present

## 2017-04-15 DIAGNOSIS — I2510 Atherosclerotic heart disease of native coronary artery without angina pectoris: Secondary | ICD-10-CM | POA: Diagnosis not present

## 2017-04-15 MED ORDER — AMLODIPINE BESYLATE 5 MG PO TABS: 5.0000 mg | ORAL_TABLET | Freq: Two times a day (BID) | ORAL | 3 refills | Status: DC

## 2017-04-15 MED ORDER — ROSUVASTATIN CALCIUM 20 MG PO TABS: 20.0000 mg | ORAL_TABLET | Freq: Every day | ORAL | 2 refills | Status: DC

## 2017-04-15 NOTE — Assessment & Plan Note (Signed)
Blood pressure well controlled today. Patient denies problems with current therapy and expressed compliance with treatment. Technique for appropriate home BP cuff usage also discussed during appointment. Will continue current therapy as previously prescribed and follow up as needed.

## 2017-04-15 NOTE — Progress Notes (Signed)
Patient ID: [name]                 DOB: 01/07/1946                      MRN: [id number]     HPI: [name] is a 71 y.o. male referred by Dr. Oval Green to HTN clinic.  PMH is significant for CAD s/p LAD PCI; PAD s/p peripheral stent; hypertension, DM (A1c =10.2) and general non-compliance. Current therapy includes bisoprolol, chlorthalidone, and amlodipine.  Patient states he was into his mid 44's before he started taking any medications, and in general he doesn't like medications and would rather not take them unless he knew what they were "doing".   During last OV amlodipine dose was changed to 5mg  every morning and 5mg  every evening.   Patient presents to HTN clinic for follow up. Denies dizziness, headaches, chest pain, swelling or shortness of breath.   Current HTN meds:  Bisoprolol 5mg  daily Chlorthalidone 12.5mg  daily Amlodipine 5mg  daily in morning and evening  BP goal: 130/80  Family History:   Mother died at 93, father at 87; pt not sure of cause of death of either (father double amputee)             3 sisters DM, 1 "bad" - on insulin;               3 kids, no known issues by patient  Social History: No tobacco, quit around 2010; drinks only on special occasions; coffee, sweet tea daily (cut coffee from 8-10 cups to 2-3 per/day  Diet:  Drinks lots of water, per patient a quart at a time; meals are mostly home cooked, rarely adds salt, states has cut back greatly over the past couple of years.  Avoids deep fried, except for occasional fish  Exercise:  Walk 1/16 -1/8 mile each am; takes 20 min depending on how often he stops to talk             golfs about 1-2 times per week  Home BP readings:   13 morning readings; average 150/70 (HR 58-88bpm) 12 evening readings; average 154/77 (HR 59-91bpm)  CV/pharmacy BP monitor (accurate within 73mmHg from manual reading) - correction on technique for proper BP assessment made during OV.  Wt Readings from Last 3 Encounters:    03/16/17 191 lb 9.6 oz (86.9 kg)  02/04/17 191 lb 12.8 oz (87 kg)  01/28/17 192 lb (87.1 kg)   BP Readings from Last 3 Encounters:  04/15/17 118/62  04/01/17 (!) 142/76  03/16/17 (!) 166/79   Pulse Readings from Last 3 Encounters:  04/15/17 62  04/01/17 64  03/16/17 (!) 56    Past Medical History:  Diagnosis Date  . Bleeding gastric ulcer 2000's  . Chronic anemia   . Coronary artery disease   . GERD (gastroesophageal reflux disease)   . History of blood transfusion 2000's   "related to bleeding gastric ulcer"  . Hyperlipidemia   . Hypertension   . PVD (peripheral vascular disease) ([place])   . S/P coronary artery stent placement 10/03/2016  . Type II diabetes mellitus ([place])     Current Outpatient Prescriptions on File Prior to Visit  Medication Sig Dispense Refill  . aspirin 81 MG tablet Take 1 tablet (81 mg total) by mouth daily. 30 tablet 11  . bisoprolol (ZEBETA) 5 MG tablet Take 1 tablet (5 mg total) by mouth daily. 30 tablet 5  . chlorthalidone (HYGROTON)  25 MG tablet 1/2 TABLET BY MOUTH DAILY 15 tablet 5  . ferrous sulfate 325 (65 FE) MG EC tablet Take 1 tablet (325 mg total) by mouth 2 (two) times daily with a meal. 60 tablet 5  . glimepiride (AMARYL) 4 MG tablet Take 1 tablet (4 mg total) by mouth daily before breakfast. Additional refills from PCP 30 tablet 0  . metFORMIN (GLUCOPHAGE-XR) 500 MG 24 hr tablet Take 1 tablet (500 mg total) by mouth daily with breakfast. 30 tablet 11   No current facility-administered medications on file prior to visit.     No Known Allergies  Blood pressure 118/62, pulse 62, SpO2 99 %.  Essential hypertension Blood pressure well controlled today. Patient denies problems with current therapy and expressed compliance with treatment. Technique for appropriate home BP cuff usage also discussed during appointment. Will continue current therapy as previously prescribed and follow up as needed.   [name] PharmD, BCPS,  [place] [id number] 04/15/2017 8:27 PM

## 2017-04-15 NOTE — Patient Instructions (Addendum)
Return for a a follow up appointment as needed  Your blood pressure today is 118/62 pulse 62  Check your blood pressure at home daily (if able) and keep record of the readings.  Take your BP meds as follows: *CONTINUE all Medication as prescribed*  Exercise as you're able, try to walk approximately 30 minutes per day.  Keep salt intake to a minimum, especially watch canned and prepared boxed foods.  Eat more fresh fruits and vegetables and fewer canned items.  Avoid eating in fast food restaurants.    HOW TO TAKE YOUR BLOOD PRESSURE: . Rest 5 minutes before taking your blood pressure. .  Don't smoke or drink caffeinated beverages for at least 30 minutes before. . Take your blood pressure before (not after) you eat. . Sit comfortably with your back supported and both feet on the floor (don't cross your legs). . Elevate your arm to heart level on a table or a desk. . Use the proper sized cuff. It should fit smoothly and snugly around your bare upper arm. There should be enough room to slip a fingertip under the cuff. The bottom edge of the cuff should be 1 inch above the crease of the elbow. . Ideally, take 3 measurements at one sitting and record the average.

## 2017-06-12 ENCOUNTER — Other Ambulatory Visit: Payer: Self-pay | Admitting: *Deleted

## 2017-06-12 DIAGNOSIS — I739 Peripheral vascular disease, unspecified: Secondary | ICD-10-CM

## 2017-06-16 ENCOUNTER — Encounter: Payer: Self-pay | Admitting: Cardiovascular Disease

## 2017-06-16 ENCOUNTER — Ambulatory Visit (INDEPENDENT_AMBULATORY_CARE_PROVIDER_SITE_OTHER): Payer: Medicare Other | Admitting: Cardiovascular Disease

## 2017-06-16 VITALS — BP 152/68 | HR 75 | Ht 68.0 in | Wt 196.6 lb

## 2017-06-16 DIAGNOSIS — I10 Essential (primary) hypertension: Secondary | ICD-10-CM | POA: Diagnosis not present

## 2017-06-16 DIAGNOSIS — Z91199 Patient's noncompliance with other medical treatment and regimen due to unspecified reason: Secondary | ICD-10-CM

## 2017-06-16 DIAGNOSIS — Z5181 Encounter for therapeutic drug level monitoring: Secondary | ICD-10-CM | POA: Diagnosis not present

## 2017-06-16 DIAGNOSIS — I739 Peripheral vascular disease, unspecified: Secondary | ICD-10-CM | POA: Diagnosis not present

## 2017-06-16 DIAGNOSIS — I2510 Atherosclerotic heart disease of native coronary artery without angina pectoris: Secondary | ICD-10-CM

## 2017-06-16 DIAGNOSIS — Z9119 Patient's noncompliance with other medical treatment and regimen: Secondary | ICD-10-CM

## 2017-06-16 DIAGNOSIS — E1151 Type 2 diabetes mellitus with diabetic peripheral angiopathy without gangrene: Secondary | ICD-10-CM

## 2017-06-16 MED ORDER — CHLORTHALIDONE 25 MG PO TABS: 25.0000 mg | ORAL_TABLET | Freq: Every day | ORAL | 5 refills | Status: DC

## 2017-06-16 MED ORDER — AMLODIPINE BESYLATE 5 MG PO TABS: 5.0000 mg | ORAL_TABLET | Freq: Every day | ORAL | 5 refills | Status: DC

## 2017-06-16 MED ORDER — METFORMIN HCL ER 500 MG PO TB24: 500.0000 mg | ORAL_TABLET | Freq: Every day | ORAL | 1 refills | Status: DC

## 2017-06-16 MED ORDER — FERROUS SULFATE 325 (65 FE) MG PO TBEC: 325.0000 mg | DELAYED_RELEASE_TABLET | Freq: Two times a day (BID) | ORAL | 5 refills | Status: DC

## 2017-06-16 MED ORDER — ROSUVASTATIN CALCIUM 20 MG PO TABS
20.0000 mg | ORAL_TABLET | Freq: Every day | ORAL | 5 refills | Status: DC
Start: 2017-06-16 — End: 2017-09-01

## 2017-06-16 NOTE — Patient Instructions (Addendum)
Medication Instructions:  TAKE AMLODIPINE 5 MG ONCE DAILY  INCREASE CHLORTHALIDONE TO 25 MG DAILY  CONTINUE BISOPROLOL AND OTHER MEDICATIONS   Labwork: FASTING LP/CMET IN 1 WEEK   Testing/Procedures: NONE  Follow-Up: Your physician recommends that you schedule a follow-up appointment in: 3 MONTH OV  CALL [facility] TO GET YOU LINED UP [place] [phone]

## 2017-06-16 NOTE — Addendum Note (Signed)
Addended by: Alvina Filbert B on: 06/16/2017 02:21 PM   Modules accepted: Orders

## 2017-06-16 NOTE — Progress Notes (Signed)
Cardiology Office Note     Date:  06/16/2017   ID:  Coulter, Oldaker April 09, 1946, MRN [id number]  PCP:  [name], MD  Cardiologist:   Skeet Latch, MD   Chief Complaint  Patient presents with  . Follow-up    3 months;     History of Present Illness: [name] is a 71 y.o. male with CAD s/p LAD PCI, PAD s/p peripheral stenting, hypertension, poorly-controlled diabetes and non-compliance who presents for follow up.  He was first seen 09/2016. He was hospitalized 02/2016 with melena.   His hemoglobin was 7.1 at the time.  He declined work up and restarted Plavix on his own.  He underwent upper and lower endoscopy and was found to have diverticulosis but no source of bleeding.  No bleeding was found on capsule endoscopy at [facility].  He reports that an exploratory surgery was recommended but he declined.  He has decided to treat himself with increased fiber instead.  He ultimately restarted Plavix and denies recurrent bleeding.  Per noted with his PCP on 09/16/16 he admitted to missing several doses of clopidogrel.  However today he reports that he "never misses meds."   Per a letter from his daughter and talking with his wife he is very noncompliant with all medications. His most recent hemoblobin A1c is 10.2%.  Mr. Borgen was previously a patient of Dr. [name]. He was instructed to follow up with Dr. [name] prior to restarting Plavix but never did.  Mr. Blazejewski was referred for [facility] 10/24/16 that revealed LVEF 49% with no ischemia and global hypokinesis. He was referred for an echocardiogram which has not yet occurred. He also had ABIs that revealed greater than 50% right distal SFA stenosis status post angioplasty and 30-49% left mid to distal superficial SFA stenosis. One year follow-up was recommended.  At his last appointment Mr. Knerr with noted to be taking his medications at different times of the day and sporadically.  Nebivolol was discontinued due to cost concerns and  he was switched to bisoprolol and hydrochlorothiazide.  He followed up with our pharmacist 03/2017 and his blood pressure was better-controlled.  Since his last appointment Mr. Mccrystal has been doing well.  He denies chest pain or shortness of breath.  He tries to walk for exercise a couple times per week.  He has not been doing this lately because of the weather.  He denies any chest pain and his breathing has been stable.  He has not noted any lower extremity edema, orthopnea, or PND.  He gets tired after walking long distances.  He does not check his blood pressure often at home.On reviewing his medications he notes that he has not been taking any amlodipine as he thought this was discontinued.  He limits the salt in his diet  Past Medical History:  Diagnosis Date  . Bleeding gastric ulcer 2000's  . Chronic anemia   . Coronary artery disease   . GERD (gastroesophageal reflux disease)   . History of blood transfusion 2000's   "related to bleeding gastric ulcer"  . Hyperlipidemia   . Hypertension   . PVD (peripheral vascular disease) ([place])   . S/P coronary artery stent placement 10/03/2016  . Type II diabetes mellitus ([place])     Past Surgical History:  Procedure Laterality Date  . CARDIAC CATHETERIZATION N/A 04/24/2015   Procedure: Left Heart Cath and Coronary Angiography;  Surgeon: [name], MD;  Location: [place] [facility];  Service: Cardiovascular;  Laterality: N/A;  . CARDIAC CATHETERIZATION N/A 04/24/2015   Procedure: Coronary Stent Intervention;  Surgeon: [name], MD;  Location: [place] [facility];  Service: Cardiovascular;  Laterality: N/A;  Prox RCA  . CARDIAC CATHETERIZATION Right 05/29/2015   Procedure: Angioplasty Perioneal and SFA;  Surgeon: [name], MD;  Location: [place] [facility];  Service: Cardiovascular;  Laterality: Right;  . COLONOSCOPY Left 02/29/2016   Procedure: COLONOSCOPY;  Surgeon: Carol Ada, MD;  Location: [facility];  Service: Endoscopy;  Laterality: Left;  .  COLONOSCOPY WITH PROPOFOL N/A 11/23/2015   Procedure: COLONOSCOPY WITH PROPOFOL;  Surgeon: Carol Ada, MD;  Location: [facility];  Service: Endoscopy;  Laterality: N/A;  . CORONARY ANGIOPLASTY    . ESOPHAGOGASTRODUODENOSCOPY (EGD) WITH PROPOFOL N/A 11/23/2015   Procedure: ESOPHAGOGASTRODUODENOSCOPY (EGD) WITH PROPOFOL;  Surgeon: Carol Ada, MD;  Location: [facility];  Service: Endoscopy;  Laterality: N/A;  . EXCISIONAL HEMORRHOIDECTOMY  1980's  . GIVENS CAPSULE STUDY N/A 02/29/2016   Procedure: GIVENS CAPSULE STUDY;  Surgeon: Carol Ada, MD;  Location: [facility];  Service: Endoscopy;  Laterality: N/A;  . PERIPHERAL VASCULAR CATHETERIZATION N/A 04/24/2015   Procedure: Lower Extremity Angiography;  Surgeon: [name], MD;  Location: [place] [facility];  Service: Cardiovascular;  Laterality: N/A;  . PERIPHERAL VASCULAR CATHETERIZATION N/A 05/29/2015   Procedure: Lower Extremity Angiography;  Surgeon: [name], MD;  Location: [place] [facility];  Service: Cardiovascular;  Laterality: N/A;  . PERIPHERAL VASCULAR CATHETERIZATION Left 05/29/2015   Procedure: Renal Angiography;  Surgeon: [name], MD;  Location: [place] [facility];  Service: Cardiovascular;  Laterality: Left;  . PERIPHERAL VASCULAR CATHETERIZATION Left 06/12/2015   Procedure: Peripheral Vascular Atherectomy;  Surgeon: [name], MD;  Location: [place] [facility];  Service: Cardiovascular;  Laterality: Left;  AT  . PERIPHERAL VASCULAR CATHETERIZATION Left 06/12/2015   Procedure: Peripheral Vascular Intervention;  Surgeon: [name], MD;  Location: [place] [facility];  Service: Cardiovascular;  Laterality: Left;  PTA TP TRUNK  . UPPER GI ENDOSCOPY  12/16/11   findings:  mild gastritis and duodenitis     Current Outpatient Prescriptions  Medication Sig Dispense Refill  . amLODipine (NORVASC) 5 MG tablet Take 1 tablet (5 mg total) by mouth daily. 30 tablet 5  . aspirin 81 MG tablet Take 1 tablet (81 mg total) by mouth daily. 30  tablet 11  . bisoprolol (ZEBETA) 5 MG tablet Take 1 tablet (5 mg total) by mouth daily. 30 tablet 5  . chlorthalidone (HYGROTON) 25 MG tablet Take 1 tablet (25 mg total) by mouth daily. 30 tablet 5  . ferrous sulfate 325 (65 FE) MG EC tablet Take 1 tablet (325 mg total) by mouth 2 (two) times daily with a meal. 60 tablet 5  . glimepiride (AMARYL) 4 MG tablet Take 1 tablet (4 mg total) by mouth daily before breakfast. Additional refills from PCP 30 tablet 0  . metFORMIN (GLUCOPHAGE-XR) 500 MG 24 hr tablet Take 1 tablet (500 mg total) by mouth daily with breakfast. 30 tablet 11  . rosuvastatin (CRESTOR) 20 MG tablet Take 1 tablet (20 mg total) by mouth daily. 30 tablet 5   No current facility-administered medications for this visit.     Allergies:   Patient has no known allergies.    Social History:  The patient  reports that he quit smoking about 2 years ago. His smoking use included Cigars and Cigarettes. He has a 54.00 pack-year smoking history. He has quit using smokeless tobacco. His  smokeless tobacco use included Chew. He reports that he does not drink alcohol or use drugs.   Family History:  The patient's family history includes Alcohol abuse in his father; CVA in his unknown relative; Diabetes in his mother; Heart attack in his father; Hypertension in his father and unknown relative; Peripheral Artery Disease in his father.    ROS:  Please see the history of present illness.   Otherwise, review of systems are positive for none.   All other systems are reviewed and negative.    PHYSICAL EXAM: VS:  BP (!) 152/68   Pulse 75   Ht 5\' 8"  (1.727 m)   Wt 89.2 kg (196 lb 9.6 oz)   BMI 29.89 kg/m  , BMI Body mass index is 29.89 kg/m. GENERAL:  Well appearing HEENT: Pupils equal round and reactive, fundi not visualized, oral mucosa unremarkable NECK:  No jugular venous distention, waveform within normal limits, carotid upstroke brisk and symmetric, no bruits, no thyromegaly LUNGS:  Clear  to auscultation bilaterally HEART:  RRR.  PMI not displaced or sustained,S1 and S2 within normal limits, no S3, no S4, no clicks, no rubs, no murmurs ABD:  Flat, positive bowel sounds normal in frequency in pitch, no bruits, no rebound, no guarding, no midline pulsatile mass, no hepatomegaly, no splenomegaly EXT:  2 plus pulses throughout, no edema, no cyanosis no clubbing SKIN:  No rashes no nodules NEURO:  Cranial nerves II through XII grossly intact, motor grossly intact throughout PSYCH:  Cognitively intact, oriented to person place and time   EKG:  EKG is not ordered today.  Echo 6/27/8: Study Conclusions  - Left ventricle: The cavity size was normal. Wall thickness was   increased in a pattern of moderate LVH. Systolic function was   normal. The estimated ejection fraction was in the range of 60%   to 65%. Wall motion was normal; there were no regional wall   motion abnormalities. Doppler parameters are consistent with   abnormal left ventricular relaxation (grade 1 diastolic   dysfunction). - Left atrium: The atrium was mildly dilated. - Atrial septum: Mobile atrial septum no obvoius PFO consider f/u   bubble study if clinically indicated.  Lexiscan Myoview 10/24/16: The left ventricular ejection fraction is mildly decreased (45-54%).  Nuclear stress EF: 49%.  There was no ST segment deviation noted during stress.  The study is normal.  This is a low risk study.   ABI 10/21/16:  >50% right distal superficial femoral artery stenosis, s/p angioplasty. 30-49% left mid to distal superficial femoral artery stenosis. Three vessel run-off, bilaterally.   Recent Labs: 01/25/2017: ALT 14; Magnesium 2.1 01/26/2017: BUN 12; Creatinine, Ser 1.25; Potassium 4.2; Sodium 135 01/28/2017: Hemoglobin 8.0; Platelets 388    Lipid Panel    Component Value Date/Time   CHOL 151 10/31/2015 1559   TRIG 136 10/31/2015 1559   HDL 41 10/31/2015 1559   CHOLHDL 3.7 10/31/2015 1559   VLDL  27 10/31/2015 1559   LDLCALC 83 10/31/2015 1559      Wt Readings from Last 3 Encounters:  06/16/17 89.2 kg (196 lb 9.6 oz)  03/16/17 86.9 kg (191 lb 9.6 oz)  02/04/17 87 kg (191 lb 12.8 oz)     ASSESSMENT AND PLAN:  # Hypertension:  Blood pressure remains above goal.  It appears that he has not been taking amlodipine.  We will restart 5 mg daily and increase chlorthalidone to 25 mg daily.  Continue bisoprolol.  He will return for a conference  of metabolic panel in about a week.    # Murmur: no valvular heart disease seen on echo.  # CAD s/p LAD PCI:  # Hyperlipidemia: No recurrent chest pain.  Stress test was negative for ischemia.  Start bisoprolol as above. Continue aspirin and rosuvastatin 20 mg daily.  Check lipids and CMP.  # Leg numbness: # PAD: Mild PAD on the L and moderate on the R.  Repeat ABIs 10/2017.  Continue rosuvastatin and aspirin.  # Anemia:  # Recurrent GI bleed: H/H was stable on repeat.  He denies recurrent melena. Clopidogrel was discontinued. Continue iron supplementation.  He was again encouraged to follow up with GI.  Refill iron    Current medicines are reviewed at length with the patient today.  The patient does not have concerns regarding medicines.  The following changes have been made: Stop nebivolol.  Start chlorthalidone and bisoprolol.  Labs/ tests ordered today include:   Orders Placed This Encounter  Procedures  . Lipid panel  . Comprehensive metabolic panel     Disposition:   FU with Ayame Rena C. Oval Linsey, MD, [facility] in 3 months.  Pharmacy in 2 weeks.    This note was written with the assistance of speech recognition software.  Please excuse any transcriptional errors.  Signed, Jorgen Wolfinger C. Oval Linsey, MD, [facility]  06/16/2017 2:16 PM    [facility]

## 2017-06-22 DIAGNOSIS — Z5181 Encounter for therapeutic drug level monitoring: Secondary | ICD-10-CM | POA: Diagnosis not present

## 2017-06-22 DIAGNOSIS — I10 Essential (primary) hypertension: Secondary | ICD-10-CM | POA: Diagnosis not present

## 2017-06-23 ENCOUNTER — Telehealth: Payer: Self-pay | Admitting: Physician Assistant

## 2017-06-23 ENCOUNTER — Encounter: Payer: Self-pay | Admitting: Cardiovascular Disease

## 2017-06-23 LAB — COMPREHENSIVE METABOLIC PANEL
ALT: 7 IU/L (ref 0–44)
AST: 10 IU/L (ref 0–40)
Albumin/Globulin Ratio: 1.4 (ref 1.2–2.2)
Albumin: 4 g/dL (ref 3.5–4.8)
Alkaline Phosphatase: 43 IU/L (ref 39–117)
BUN/Creatinine Ratio: 15 (ref 10–24)
BUN: 19 mg/dL (ref 8–27)
Bilirubin Total: 0.3 mg/dL (ref 0.0–1.2)
CO2: 23 mmol/L (ref 20–29)
Calcium: 9.3 mg/dL (ref 8.6–10.2)
Chloride: 100 mmol/L (ref 96–106)
Creatinine, Ser: 1.31 mg/dL — ABNORMAL HIGH (ref 0.76–1.27)
GFR calc Af Amer: 62 mL/min/{1.73_m2} (ref >59)
GFR calc non Af Amer: 54 mL/min/{1.73_m2} — ABNORMAL LOW (ref >59)
Globulin, Total: 2.9 g/dL (ref 1.5–4.5)
Glucose: 336 mg/dL — ABNORMAL HIGH (ref 65–99)
Potassium: 4.9 mmol/L (ref 3.5–5.2)
Sodium: 137 mmol/L (ref 134–144)
Total Protein: 6.9 g/dL (ref 6.0–8.5)

## 2017-06-23 LAB — LIPID PANEL
Chol/HDL Ratio: 3.9 ratio (ref 0.0–5.0)
Cholesterol, Total: 143 mg/dL (ref 100–199)
HDL: 37 mg/dL — ABNORMAL LOW (ref >39)
LDL Calculated: 81 mg/dL (ref 0–99)
Triglycerides: 126 mg/dL (ref 0–149)
VLDL Cholesterol Cal: 25 mg/dL (ref 5–40)

## 2017-06-23 NOTE — Telephone Encounter (Signed)
Paged by answering service. Patient has DOE x 1 week. Worsen today. He barley able to walk in house. No associated CP, dizziness, nausea, vomiting or diaphoresis.  No orthopnea, pnd, syncope, back or blood in stool. Hx of non compliance and prior anemia. CMP yesterday showed stable Scr however Blood glucose of ~350. Per daughter, Patient was out of diabetics medications for past 2 weeks however patient stated that he is taking daily.  His BP was 163/83 with pulse of 104 during my conversation. Patient stated that he has took his antihypertensive medications today. Advised to take extra 5mg  of Norvasc. He recently had chills but denies being sick.   Reccommended evaluation in ER if no improvement in few hours after taking meds. Otherwise, daughter will call office in AM.

## 2017-06-24 ENCOUNTER — Encounter: Payer: Self-pay | Admitting: *Deleted

## 2017-06-24 ENCOUNTER — Telehealth: Payer: Self-pay

## 2017-06-24 ENCOUNTER — Telehealth: Payer: Self-pay | Admitting: Cardiovascular Disease

## 2017-06-24 NOTE — Telephone Encounter (Signed)
Returned call to patient's daughter Jone Baseman.She stated she was not aware father was having chest numbness this morning.I just spoke to patient again and he stated he was not having chest numbness at present.Daughter stated father does not always tell her the truth.He does not want to go to ED.Advised he has appointment with Kerin Ransom PA 11/8 at 3:00 pm.Advised he needs to go to ED if he has any more chest numbness and sob.

## 2017-06-24 NOTE — Telephone Encounter (Signed)
Spoke to patient we received your email about your sob.Stated he has been having sob,chest numbness radiating down left arm off and on for the past 1 week.Stated he only has sob, and chest numbness with activity.No sob or chest numbness at present.Appointment offered with Dr.Saraland at 9:20 am this morning, but he is unable to come.Appointment scheduled with Kerin Ransom PA 06/25/17 at 3:00 pm.Advised if he has sob or chest numbness he needs to go to ED.

## 2017-06-24 NOTE — Telephone Encounter (Signed)
F/U Message  Daymon Larsen returning RN call .please call back to discuss

## 2017-06-25 ENCOUNTER — Encounter: Payer: Self-pay | Admitting: Cardiology

## 2017-06-25 ENCOUNTER — Ambulatory Visit (INDEPENDENT_AMBULATORY_CARE_PROVIDER_SITE_OTHER): Payer: Medicare Other | Admitting: Cardiology

## 2017-06-25 DIAGNOSIS — I5031 Acute diastolic (congestive) heart failure: Secondary | ICD-10-CM

## 2017-06-25 DIAGNOSIS — R0609 Other forms of dyspnea: Secondary | ICD-10-CM | POA: Diagnosis not present

## 2017-06-25 DIAGNOSIS — Z9861 Coronary angioplasty status: Secondary | ICD-10-CM

## 2017-06-25 DIAGNOSIS — R079 Chest pain, unspecified: Secondary | ICD-10-CM

## 2017-06-25 DIAGNOSIS — I2510 Atherosclerotic heart disease of native coronary artery without angina pectoris: Secondary | ICD-10-CM

## 2017-06-25 DIAGNOSIS — I110 Hypertensive heart disease with heart failure: Secondary | ICD-10-CM | POA: Diagnosis not present

## 2017-06-25 DIAGNOSIS — I119 Hypertensive heart disease without heart failure: Secondary | ICD-10-CM | POA: Insufficient documentation

## 2017-06-25 DIAGNOSIS — I10 Essential (primary) hypertension: Secondary | ICD-10-CM

## 2017-06-25 DIAGNOSIS — D509 Iron deficiency anemia, unspecified: Secondary | ICD-10-CM

## 2017-06-25 DIAGNOSIS — I739 Peripheral vascular disease, unspecified: Secondary | ICD-10-CM | POA: Diagnosis not present

## 2017-06-25 DIAGNOSIS — R0600 Dyspnea, unspecified: Secondary | ICD-10-CM | POA: Insufficient documentation

## 2017-06-25 MED ORDER — ISOSORBIDE MONONITRATE ER 30 MG PO TB24: 30.0000 mg | ORAL_TABLET | Freq: Every day | ORAL | 3 refills | Status: DC

## 2017-06-25 NOTE — Assessment & Plan Note (Signed)
RCA DES Sept 2016. Myoview March 2018 low risk

## 2017-06-25 NOTE — Assessment & Plan Note (Signed)
Normal LVF with moderate LVH by echo June 2018

## 2017-06-25 NOTE — Assessment & Plan Note (Signed)
GI work up in 2017 by Dr [name]. Check CBC today

## 2017-06-25 NOTE — Patient Instructions (Signed)
Medication Instructions:  INCREASE CHLORTHALIDONE 25MG  DAILY  START IMDUR 30MG  DAILY If you need a refill on your cardiac medications before your next appointment, please call your pharmacy.  Labwork: CBC TODAY HERE IN OUR OFFICE AT [facility]  Testing/Procedures: Your physician has requested that you have a lexiscan myoview. For further information please visit [email or web address]. Please follow instruction sheet, as given.  Follow-Up: Your physician wants you to follow-up in: [place] [facility]   Thank you for choosing [facility] at [facility]!!

## 2017-06-25 NOTE — Assessment & Plan Note (Signed)
Pt has evidence of CHF on exam

## 2017-06-25 NOTE — Assessment & Plan Note (Signed)
Sub optimal control 

## 2017-06-25 NOTE — Assessment & Plan Note (Signed)
Pt complains of new DOE for the past few weeks

## 2017-06-25 NOTE — Assessment & Plan Note (Signed)
Pt has noted vague chest discomfort and arm discomfort similar to his pre PCI symptoms

## 2017-06-25 NOTE — Progress Notes (Signed)
06/25/2017 [name]   05-Oct-1945  [id number]  Primary Physician [name], MD Primary Cardiologist: Dr Oval Linsey  HPI:  71 y/o male with a history of CAD, s/p PCI in 2016, Myoview low risk March 2018, PVD, s/p intervention in 2016, HTN with normal LVF and moderate LVH, NIDDM, past anemia, and unclear medication/ diet compliance in the past.  The pt's family has contradicted the pt on wether or not he is taking his medications. He just saw Dr Oval Linsey 06/16/17 and was doing well. He tells me 3 days later he went to [facility] then to a party and when he got home noted new DOE walking his driveway. He says the has persisted. No orthopnea, mainly DOE as well as vague chest discomfort and some Lt arm pain. He thinks his symptoms are similar to his pre PCI symptoms. He reports compliance with his medications.    Current Outpatient Medications  Medication Sig Dispense Refill  . amLODipine (NORVASC) 5 MG tablet Take 1 tablet (5 mg total) by mouth daily. 30 tablet 5  . aspirin 81 MG tablet Take 1 tablet (81 mg total) by mouth daily. 30 tablet 11  . bisoprolol (ZEBETA) 5 MG tablet Take 1 tablet (5 mg total) by mouth daily. 30 tablet 5  . chlorthalidone (HYGROTON) 25 MG tablet Take 1 tablet (25 mg total) by mouth daily. 30 tablet 5  . ferrous sulfate 325 (65 FE) MG EC tablet Take 1 tablet (325 mg total) by mouth 2 (two) times daily with a meal. 60 tablet 5  . glimepiride (AMARYL) 4 MG tablet Take 1 tablet (4 mg total) by mouth daily before breakfast. Additional refills from PCP 30 tablet 0  . isosorbide mononitrate (IMDUR) 30 MG 24 hr tablet Take 1 tablet (30 mg total) daily by mouth. 30 tablet 3  . metFORMIN (GLUCOPHAGE-XR) 500 MG 24 hr tablet Take 1 tablet (500 mg total) by mouth daily with breakfast. ADDITIONAL REFILLS FROM PCP 30 tablet 1  . rosuvastatin (CRESTOR) 20 MG tablet Take 1 tablet (20 mg total) by mouth daily. 30 tablet 5   No current facility-administered medications for this  visit.     No Known Allergies  Past Medical History:  Diagnosis Date  . Bleeding gastric ulcer 2000's  . Chronic anemia   . Coronary artery disease   . GERD (gastroesophageal reflux disease)   . History of blood transfusion 2000's   "related to bleeding gastric ulcer"  . Hyperlipidemia   . Hypertension   . PVD (peripheral vascular disease) ([place])   . S/P coronary artery stent placement 10/03/2016  . Type II diabetes mellitus ([place])     Social History   Socioeconomic History  . Marital status: Married    Spouse name: Not on file  . Number of children: Not on file  . Years of education: Not on file  . Highest education level: Not on file  Social Needs  . Financial resource strain: Not on file  . Food insecurity - worry: Not on file  . Food insecurity - inability: Not on file  . Transportation needs - medical: Not on file  . Transportation needs - non-medical: Not on file  Occupational History  . Not on file  Tobacco Use  . Smoking status: Former Smoker    Packs/day: 1.00    Years: 54.00    Pack years: 54.00    Types: Cigars, Cigarettes    Last attempt to quit: 02/16/2015    Years since  quitting: 2.3  . Smokeless tobacco: Former Systems developer    Types: Chew  . Tobacco comment: "chewed some when I was young"  Substance and Sexual Activity  . Alcohol use: No    Alcohol/week: 0.0 oz    Comment: 04/24/2015 "no beer in 2 wks; ;no liquor in 3-4 weeks; I'm not a real heavy drinker" states he stopped drining 2 yrs ago  . Drug use: No  . Sexual activity: No  Other Topics Concern  . Not on file  Social History Narrative  . Not on file     Family History  Problem Relation Age of Onset  . CVA Unknown   . Hypertension Unknown   . Diabetes Mother   . Hypertension Father   . Peripheral Artery Disease Father   . Alcohol abuse Father   . Heart attack Father      Review of Systems: General: negative for chills, fever, night sweats or weight changes.  Cardiovascular: negative for  chest pain, dyspnea on exertion, edema, orthopnea, palpitations, paroxysmal nocturnal dyspnea or shortness of breath Dermatological: negative for rash Respiratory: negative for cough or wheezing Urologic: negative for hematuria Abdominal: negative for nausea, vomiting, diarrhea, bright red blood per rectum, melena, or hematemesis Neurologic: negative for visual changes, syncope, or dizziness All other systems reviewed and are otherwise negative except as noted above.    There were no vitals taken for this visit.  General appearance: alert, cooperative and no distress Neck: no carotid bruit and JVD noted Lungs: clear to auscultation bilaterally Heart: regular rate and rhythm and soft systolic murmur AOV Abdomen: soft, non-tender; bowel sounds normal; no masses,  no organomegaly Extremities: extremities normal, atraumatic, no cyanosis or edema Skin: Skin color, texture, turgor normal. No rashes or lesions Neurologic: Grossly normal  EKG NSR, NSR, septal q  ASSESSMENT AND PLAN:   DOE (dyspnea on exertion) Pt complains of new DOE for the past few weeks  Chest pain with high risk for cardiac etiology Pt has noted vague chest discomfort and arm discomfort similar to his pre PCI symptoms  Essential hypertension Sub optimal control  Acute diastolic CHF (congestive heart failure) (HCC) Pt has evidence of CHF on exam  CAD S/P percutaneous coronary angioplasty RCA DES Sept 2016. Myoview March 2018 low risk  Anemia, iron deficiency GI work up in 2017 by Dr [name]. Check CBC today  Hypertensive cardiovascular disease Normal LVF with moderate LVH by echo June 2018   PLAN  I'm not sure if his symptoms are secondary to uncontrolled B/P and diastolic CHF or new angina. I have increased his chlorthalidone to 25 mg and added Imdur 30 mg. I'll get a Myoview and have him f/u with Dr Gwenlyn Found.   Kerin Ransom PA-C 06/25/2017 4:15 PM

## 2017-06-26 ENCOUNTER — Other Ambulatory Visit: Payer: Self-pay

## 2017-06-26 ENCOUNTER — Emergency Department (HOSPITAL_COMMUNITY): Payer: Medicare Other

## 2017-06-26 ENCOUNTER — Telehealth: Payer: Self-pay | Admitting: Cardiology

## 2017-06-26 ENCOUNTER — Inpatient Hospital Stay (HOSPITAL_COMMUNITY)
Admission: EM | Admit: 2017-06-26 | Discharge: 2017-07-04 | DRG: 330 | Disposition: A | Discharge status: Home / Self Care | Payer: Medicare Other | Attending: Nephrology | Admitting: Nephrology

## 2017-06-26 ENCOUNTER — Encounter (HOSPITAL_COMMUNITY): Payer: Self-pay | Admitting: Emergency Medicine

## 2017-06-26 DIAGNOSIS — D509 Iron deficiency anemia, unspecified: Secondary | ICD-10-CM | POA: Diagnosis present

## 2017-06-26 DIAGNOSIS — E785 Hyperlipidemia, unspecified: Secondary | ICD-10-CM | POA: Diagnosis not present

## 2017-06-26 DIAGNOSIS — N182 Chronic kidney disease, stage 2 (mild): Secondary | ICD-10-CM | POA: Diagnosis not present

## 2017-06-26 DIAGNOSIS — Z978 Presence of other specified devices: Secondary | ICD-10-CM | POA: Diagnosis not present

## 2017-06-26 DIAGNOSIS — Z9861 Coronary angioplasty status: Secondary | ICD-10-CM | POA: Diagnosis not present

## 2017-06-26 DIAGNOSIS — Z87891 Personal history of nicotine dependence: Secondary | ICD-10-CM

## 2017-06-26 DIAGNOSIS — I2510 Atherosclerotic heart disease of native coronary artery without angina pectoris: Secondary | ICD-10-CM | POA: Diagnosis present

## 2017-06-26 DIAGNOSIS — I110 Hypertensive heart disease with heart failure: Secondary | ICD-10-CM | POA: Diagnosis not present

## 2017-06-26 DIAGNOSIS — D490 Neoplasm of unspecified behavior of digestive system: Secondary | ICD-10-CM | POA: Diagnosis not present

## 2017-06-26 DIAGNOSIS — R0602 Shortness of breath: Secondary | ICD-10-CM

## 2017-06-26 DIAGNOSIS — Z7901 Long term (current) use of anticoagulants: Secondary | ICD-10-CM

## 2017-06-26 DIAGNOSIS — Z7982 Long term (current) use of aspirin: Secondary | ICD-10-CM

## 2017-06-26 DIAGNOSIS — Z833 Family history of diabetes mellitus: Secondary | ICD-10-CM | POA: Diagnosis not present

## 2017-06-26 DIAGNOSIS — K6389 Other specified diseases of intestine: Secondary | ICD-10-CM | POA: Diagnosis not present

## 2017-06-26 DIAGNOSIS — R933 Abnormal findings on diagnostic imaging of other parts of digestive tract: Secondary | ICD-10-CM

## 2017-06-26 DIAGNOSIS — Z8249 Family history of ischemic heart disease and other diseases of the circulatory system: Secondary | ICD-10-CM | POA: Diagnosis not present

## 2017-06-26 DIAGNOSIS — Z955 Presence of coronary angioplasty implant and graft: Secondary | ICD-10-CM

## 2017-06-26 DIAGNOSIS — I5031 Acute diastolic (congestive) heart failure: Secondary | ICD-10-CM | POA: Diagnosis not present

## 2017-06-26 DIAGNOSIS — I129 Hypertensive chronic kidney disease with stage 1 through stage 4 chronic kidney disease, or unspecified chronic kidney disease: Secondary | ICD-10-CM | POA: Diagnosis present

## 2017-06-26 DIAGNOSIS — D649 Anemia, unspecified: Secondary | ICD-10-CM

## 2017-06-26 DIAGNOSIS — D3A8 Other benign neuroendocrine tumors: Secondary | ICD-10-CM | POA: Diagnosis not present

## 2017-06-26 DIAGNOSIS — Z8711 Personal history of peptic ulcer disease: Secondary | ICD-10-CM | POA: Diagnosis not present

## 2017-06-26 DIAGNOSIS — E119 Type 2 diabetes mellitus without complications: Secondary | ICD-10-CM

## 2017-06-26 DIAGNOSIS — Z7902 Long term (current) use of antithrombotics/antiplatelets: Secondary | ICD-10-CM | POA: Diagnosis not present

## 2017-06-26 DIAGNOSIS — R0600 Dyspnea, unspecified: Secondary | ICD-10-CM | POA: Diagnosis not present

## 2017-06-26 DIAGNOSIS — R079 Chest pain, unspecified: Secondary | ICD-10-CM | POA: Diagnosis not present

## 2017-06-26 DIAGNOSIS — Z4682 Encounter for fitting and adjustment of non-vascular catheter: Secondary | ICD-10-CM | POA: Diagnosis not present

## 2017-06-26 DIAGNOSIS — E1122 Type 2 diabetes mellitus with diabetic chronic kidney disease: Secondary | ICD-10-CM | POA: Diagnosis not present

## 2017-06-26 DIAGNOSIS — R531 Weakness: Secondary | ICD-10-CM | POA: Diagnosis not present

## 2017-06-26 DIAGNOSIS — K922 Gastrointestinal hemorrhage, unspecified: Secondary | ICD-10-CM | POA: Diagnosis not present

## 2017-06-26 DIAGNOSIS — K561 Intussusception: Secondary | ICD-10-CM | POA: Diagnosis not present

## 2017-06-26 DIAGNOSIS — E1151 Type 2 diabetes mellitus with diabetic peripheral angiopathy without gangrene: Secondary | ICD-10-CM | POA: Diagnosis not present

## 2017-06-26 DIAGNOSIS — I10 Essential (primary) hypertension: Secondary | ICD-10-CM | POA: Diagnosis present

## 2017-06-26 DIAGNOSIS — D62 Acute posthemorrhagic anemia: Secondary | ICD-10-CM | POA: Diagnosis not present

## 2017-06-26 DIAGNOSIS — D3A019 Benign carcinoid tumor of the small intestine, unspecified portion: Secondary | ICD-10-CM | POA: Diagnosis not present

## 2017-06-26 DIAGNOSIS — Z7984 Long term (current) use of oral hypoglycemic drugs: Secondary | ICD-10-CM

## 2017-06-26 DIAGNOSIS — D3A098 Benign carcinoid tumors of other sites: Secondary | ICD-10-CM | POA: Diagnosis not present

## 2017-06-26 DIAGNOSIS — R0609 Other forms of dyspnea: Secondary | ICD-10-CM

## 2017-06-26 DIAGNOSIS — D500 Iron deficiency anemia secondary to blood loss (chronic): Secondary | ICD-10-CM | POA: Diagnosis not present

## 2017-06-26 HISTORY — DX: Patient's noncompliance with other medical treatment and regimen due to unspecified reason: Z91.199

## 2017-06-26 HISTORY — DX: Gastrointestinal hemorrhage, unspecified: K92.2

## 2017-06-26 HISTORY — DX: Patient's noncompliance with other medical treatment and regimen: Z91.19

## 2017-06-26 HISTORY — DX: Diverticulosis of intestine, part unspecified, without perforation or abscess without bleeding: K57.90

## 2017-06-26 HISTORY — DX: Angiodysplasia of colon without hemorrhage: K55.20

## 2017-06-26 LAB — CBC
HCT: 19.5 % — ABNORMAL LOW (ref 39.0–52.0)
Hematocrit: 21.6 % — ABNORMAL LOW (ref 37.5–51.0)
Hemoglobin: 6.5 g/dL — CL (ref 13.0–17.7)
Hemoglobin: 6 g/dL — CL (ref 13.0–17.0)
MCH: 22.8 pg — ABNORMAL LOW (ref 26.6–33.0)
MCH: 23.3 pg — ABNORMAL LOW (ref 26.0–34.0)
MCHC: 30.1 g/dL — ABNORMAL LOW (ref 31.5–35.7)
MCHC: 30.8 g/dL (ref 30.0–36.0)
MCV: 76 fL — ABNORMAL LOW (ref 79–97)
MCV: 75.6 fL — ABNORMAL LOW (ref 78.0–100.0)
Platelets: 296 10*3/uL (ref 150–379)
Platelets: 265 10*3/uL (ref 150–400)
RBC: 2.85 x10E6/uL — ABNORMAL LOW (ref 4.14–5.80)
RBC: 2.58 MIL/uL — ABNORMAL LOW (ref 4.22–5.81)
RDW: 18.7 % — ABNORMAL HIGH (ref 12.3–15.4)
RDW: 20.2 % — ABNORMAL HIGH (ref 11.5–15.5)
WBC: 7.7 10*3/uL (ref 3.4–10.8)
WBC: 7.5 10*3/uL (ref 4.0–10.5)

## 2017-06-26 LAB — BASIC METABOLIC PANEL
Anion gap: 10 (ref 5–15)
BUN: 20 mg/dL (ref 6–20)
CO2: 25 mmol/L (ref 22–32)
Calcium: 9.2 mg/dL (ref 8.9–10.3)
Chloride: 100 mmol/L — ABNORMAL LOW (ref 101–111)
Creatinine, Ser: 1.35 mg/dL — ABNORMAL HIGH (ref 0.61–1.24)
GFR calc Af Amer: 59 mL/min — ABNORMAL LOW (ref >60)
GFR calc non Af Amer: 51 mL/min — ABNORMAL LOW (ref >60)
Glucose, Bld: 303 mg/dL — ABNORMAL HIGH (ref 65–99)
Potassium: 4 mmol/L (ref 3.5–5.1)
Sodium: 135 mmol/L (ref 135–145)

## 2017-06-26 LAB — BRAIN NATRIURETIC PEPTIDE: B Natriuretic Peptide: 14.4 pg/mL (ref 0.0–100.0)

## 2017-06-26 LAB — GLUCOSE, CAPILLARY: Glucose-Capillary: 218 mg/dL — ABNORMAL HIGH (ref 65–99)

## 2017-06-26 LAB — CBG MONITORING, ED: Glucose-Capillary: 303 mg/dL — ABNORMAL HIGH (ref 65–99)

## 2017-06-26 LAB — PREPARE RBC (CROSSMATCH)

## 2017-06-26 LAB — I-STAT TROPONIN, ED: Troponin i, poc: 0 ng/mL (ref 0.00–0.08)

## 2017-06-26 LAB — LACTATE DEHYDROGENASE: LDH: 93 U/L — ABNORMAL LOW (ref 98–192)

## 2017-06-26 LAB — D-DIMER, QUANTITATIVE: D-Dimer, Quant: 0.48 ug/mL-FEU (ref 0.00–0.50)

## 2017-06-26 MED ORDER — AMLODIPINE BESYLATE 5 MG PO TABS
5.0000 mg | ORAL_TABLET | Freq: Every day | ORAL | Status: DC
  Administered 2017-06-27 – 2017-07-04 (×7): 5 mg via ORAL
  Filled 2017-06-26 (×7): qty 1

## 2017-06-26 MED ORDER — PANTOPRAZOLE SODIUM 40 MG IV SOLR
40.0000 mg | Freq: Two times a day (BID) | INTRAVENOUS | Status: DC
  Administered 2017-06-26 – 2017-06-27 (×2): 40 mg via INTRAVENOUS
  Filled 2017-06-26 (×2): qty 40

## 2017-06-26 MED ORDER — BISOPROLOL FUMARATE 5 MG PO TABS
5.0000 mg | ORAL_TABLET | Freq: Every day | ORAL | Status: DC
  Administered 2017-06-27 – 2017-07-04 (×7): 5 mg via ORAL
  Filled 2017-06-26 (×7): qty 1

## 2017-06-26 MED ORDER — CHLORTHALIDONE 25 MG PO TABS
25.0000 mg | ORAL_TABLET | Freq: Every day | ORAL | Status: DC
  Filled 2017-06-26: qty 1

## 2017-06-26 MED ORDER — INSULIN ASPART 100 UNIT/ML ~~LOC~~ SOLN
0.0000 [IU] | Freq: Three times a day (TID) | SUBCUTANEOUS | Status: DC
  Administered 2017-06-27: 3 [IU] via SUBCUTANEOUS
  Administered 2017-06-27: 2 [IU] via SUBCUTANEOUS
  Administered 2017-06-27 – 2017-06-28 (×3): 5 [IU] via SUBCUTANEOUS
  Administered 2017-06-28 – 2017-06-29 (×3): 3 [IU] via SUBCUTANEOUS

## 2017-06-26 MED ORDER — ROSUVASTATIN CALCIUM 10 MG PO TABS
20.0000 mg | ORAL_TABLET | Freq: Every day | ORAL | Status: DC
  Administered 2017-06-27 – 2017-07-04 (×7): 20 mg via ORAL
  Filled 2017-06-26 (×7): qty 2

## 2017-06-26 MED ORDER — ISOSORBIDE MONONITRATE ER 30 MG PO TB24
30.0000 mg | ORAL_TABLET | Freq: Every day | ORAL | Status: DC
  Administered 2017-06-27 – 2017-07-04 (×7): 30 mg via ORAL
  Filled 2017-06-26 (×7): qty 1

## 2017-06-26 MED ORDER — SODIUM CHLORIDE 0.9 % IV SOLN
10.0000 mL/h | Freq: Once | INTRAVENOUS | Status: AC
  Administered 2017-06-26: 10 mL/h via INTRAVENOUS

## 2017-06-26 MED ORDER — SODIUM CHLORIDE 0.9 % IV SOLN
Freq: Once | INTRAVENOUS | Status: AC
  Administered 2017-06-26: 22:00:00 via INTRAVENOUS

## 2017-06-26 MED ORDER — INSULIN ASPART 100 UNIT/ML ~~LOC~~ SOLN
0.0000 [IU] | Freq: Every day | SUBCUTANEOUS | Status: DC
  Administered 2017-06-26: 2 [IU] via SUBCUTANEOUS
  Administered 2017-06-29: 3 [IU] via SUBCUTANEOUS

## 2017-06-26 MED ORDER — POTASSIUM CHLORIDE IN NACL 20-0.9 MEQ/L-% IV SOLN
INTRAVENOUS | Status: DC
  Administered 2017-06-27 – 2017-06-30 (×4): via INTRAVENOUS
  Filled 2017-06-26 (×5): qty 1000

## 2017-06-26 NOTE — H&P (Signed)
History and Physical    [name] DGU:[id number] DOB: 01/07/1946 DOA: 06/26/2017  PCP: None Patient coming from: home    Chief Complaint: sob  HPI: [name] is a 71 y.o. male with medical history significant of cad s/p stent 2016, pvd, htn, rcent gi bleed, anemia, ckd 2/3, presenting with  Two days of progressively worsening dyspnea on exertion  Multiple hospitalizations last year or two for gi bleed. AVM and duodenal ulceration has been seen. Per patient has been told possibly a "mass" and suggested exploratory lap, but pt has declined.  Denies chest pain, denies LE swelling, denies cough, denies nausea/vomiting, denies fever  No hematochezia, no melena, no hematemesis, no maroon stools, no hematuria.  ED Course: 2 units PRBCs ordered, labs  Review of Systems: As per HPI otherwise 10 point review of systems negative.   Past Medical History:  Diagnosis Date  . Angiodysplasia of small intestine 02/2016   terminal ileum  . Bleeding gastric ulcer 2000's  . Chronic anemia   . Coronary artery disease   . Diverticulosis   . GERD (gastroesophageal reflux disease)   . GI bleeding   . History of blood transfusion 2000's   "related to bleeding gastric ulcer"  . Hyperlipidemia   . Hypertension   . Non-compliance   . PVD (peripheral vascular disease) ([place])   . S/P coronary artery stent placement 10/03/2016  . Type II diabetes mellitus ([place])     Past Surgical History:  Procedure Laterality Date  . CORONARY ANGIOPLASTY    . EXCISIONAL HEMORRHOIDECTOMY  1980's  . UPPER GI ENDOSCOPY  12/16/11   findings:  mild gastritis and duodenitis     reports that he quit smoking about 2 years ago. His smoking use included cigars and cigarettes. He has a 54.00 pack-year smoking history. He has quit using smokeless tobacco. His smokeless tobacco use included chew. He reports that he does not drink alcohol or use drugs.  No Known Allergies  Family History  Problem Relation Age of  Onset  . CVA Unknown   . Hypertension Unknown   . Diabetes Mother   . Hypertension Father   . Peripheral Artery Disease Father   . Alcohol abuse Father   . Heart attack Father      Prior to Admission medications   Medication Sig Start Date End Date Taking? Authorizing Provider  amLODipine (NORVASC) 5 MG tablet Take 1 tablet (5 mg total) by mouth daily. 06/16/17 09/14/17 Yes Skeet Latch, MD  aspirin 81 MG tablet Take 1 tablet (81 mg total) by mouth daily. 04/24/15  Yes [name], MD  atorvastatin (LIPITOR) 40 MG tablet Take 40 mg daily by mouth.   Yes [provider]  bisoprolol (ZEBETA) 5 MG tablet Take 1 tablet (5 mg total) by mouth daily. 03/16/17  Yes Skeet Latch, MD  chlorthalidone (HYGROTON) 25 MG tablet Take 1 tablet (25 mg total) by mouth daily. 06/16/17  Yes Skeet Latch, MD  clopidogrel (PLAVIX) 75 MG tablet Take 75 mg daily by mouth.   Yes [provider]  ferrous sulfate 325 (65 FE) MG EC tablet Take 1 tablet (325 mg total) by mouth 2 (two) times daily with a meal. 06/16/17  Yes Skeet Latch, MD  isosorbide mononitrate (IMDUR) 30 MG 24 hr tablet Take 1 tablet (30 mg total) daily by mouth. 06/25/17  Yes Kilroy, Luke K, PA-C  metFORMIN (GLUCOPHAGE-XR) 500 MG 24 hr tablet Take 1 tablet (500 mg total) by mouth daily with breakfast. ADDITIONAL REFILLS FROM  PCP 06/16/17  Yes Skeet Latch, MD  glimepiride (AMARYL) 4 MG tablet Take 1 tablet (4 mg total) by mouth daily before breakfast. Additional refills from PCP 11/27/16   Skeet Latch, MD  rosuvastatin (CRESTOR) 20 MG tablet Take 1 tablet (20 mg total) by mouth daily. 06/16/17 09/14/17  Skeet Latch, MD    Physical Exam: Vitals:   06/26/17 1748  BP: (!) 162/87  Pulse: 97  Temp: 98.7 F (37.1 C)  TempSrc: Oral  SpO2: 98%    Constitutional: NAD, calm, comfortable Vitals:   06/26/17 1748  BP: (!) 162/87  Pulse: 97  Temp: 98.7 F (37.1 C)  TempSrc: Oral  SpO2: 98%    Eyes: PERRL, lids and conjunctivae normal ENMT: Mucous membranes are moist. Neck: normal, supple, no masses, no thyromegaly Respiratory: clear to auscultation bilaterally, no wheezing, no crackles. Normal respiratory effort. No accessory muscle use.  Cardiovascular: Regular rate and rhythm, no murmurs / rubs / gallops. No extremity edema. 2+ pedal pulses. No carotid bruits.  Abdomen: no tenderness, no masses palpated. No hepatosplenomegaly. Bowel sounds positive.  Musculoskeletal: no clubbing / cyanosis. Skin: no rashes, lesions, ulcers. No induration Neurologic: moving all extremities Psychiatric: alert   Labs on Admission: I have personally reviewed following labs and imaging studies  CBC: Recent Labs  Lab 06/25/17 1546 06/26/17 1812  WBC 7.7 7.5  HGB 6.5* 6.0*  HCT 21.6* 19.5*  MCV 76* 75.6*  PLT 296 425   Basic Metabolic Panel: Recent Labs  Lab 06/22/17 0946 06/26/17 1812  NA 137 135  K 4.9 4.0  CL 100 100*  CO2 23 25  GLUCOSE 336* 303*  BUN 19 20  CREATININE 1.31* 1.35*  CALCIUM 9.3 9.2   GFR: Estimated Creatinine Clearance: 53.7 mL/min (A) (by C-G formula based on SCr of 1.35 mg/dL (H)). Liver Function Tests: Recent Labs  Lab 06/22/17 0946  AST 10  ALT 7  ALKPHOS 43  BILITOT 0.3  PROT 6.9  ALBUMIN 4.0   No results for input(s): LIPASE, AMYLASE in the last 168 hours. No results for input(s): AMMONIA in the last 168 hours. Coagulation Profile: No results for input(s): INR, PROTIME in the last 168 hours. Cardiac Enzymes: No results for input(s): CKTOTAL, CKMB, CKMBINDEX, TROPONINI in the last 168 hours. BNP (last 3 results) No results for input(s): PROBNP in the last 8760 hours. HbA1C: No results for input(s): HGBA1C in the last 72 hours. CBG: Recent Labs  Lab 06/26/17 1748  GLUCAP 303*   Lipid Profile: No results for input(s): CHOL, HDL, LDLCALC, TRIG, CHOLHDL, LDLDIRECT in the last 72 hours. Thyroid Function Tests: No results for  input(s): TSH, T4TOTAL, FREET4, T3FREE, THYROIDAB in the last 72 hours. Anemia Panel: No results for input(s): VITAMINB12, FOLATE, FERRITIN, TIBC, IRON, RETICCTPCT in the last 72 hours. Urine analysis:    Component Value Date/Time   COLORURINE YELLOW 02/27/2016 1700   APPEARANCEUR CLEAR 02/27/2016 1700   LABSPEC 1.012 02/27/2016 1700   PHURINE 5.0 02/27/2016 1700   GLUCOSEU NEGATIVE 02/27/2016 1700   HGBUR NEGATIVE 02/27/2016 1700   BILIRUBINUR NEGATIVE 02/27/2016 1700   BILIRUBINUR neg 10/17/2014 2042   KETONESUR NEGATIVE 02/27/2016 1700   PROTEINUR NEGATIVE 02/27/2016 1700   UROBILINOGEN 0.2 10/17/2014 2042   UROBILINOGEN 0.2 08/26/2010 0414   NITRITE NEGATIVE 02/27/2016 1700   LEUKOCYTESUR NEGATIVE 02/27/2016 1700    Radiological Exams on Admission: Dg Chest 2 View  Result Date: 06/26/2017 CLINICAL DATA:  Initial evaluation for acute chest pain. EXAM: CHEST  2 VIEW  COMPARISON:  Prior radiograph from 02/16/2016. FINDINGS: Mild cardiomegaly, stable. Mediastinal silhouette normal. Aortic atherosclerosis. The lungs are normally inflated. No airspace consolidation, pleural effusion, or pulmonary edema is identified. There is no pneumothorax. No acute osseous abnormality identified. IMPRESSION: 1. No active cardiopulmonary disease. 2. Mild cardiomegaly, stable. 3. Aortic atherosclerosis. Electronically Signed   By: Jeannine Boga M.D.   On: 06/26/2017 19:49    EKG: Independently reviewed. Nsr, lad  Assessment/Plan Active Problems:   DM2 (diabetes mellitus, type 2) (HCC)   Anemia, iron deficiency   Essential hypertension   CAD S/P percutaneous coronary angioplasty   DOE (dyspnea on exertion)   Iron deficiency anemia due to chronic blood loss  #Microcytic anemia #Dyspnea on exertion - hx of GI bleed, baseline hemlgobin in the 8s, now low 6s. Hemodynamically stable, no pt report of GI or other bleeding - 2 units PRBcs ordered, 2 to be held - pantoprazole 40 IV bid - likely  GI consult in AM - f/u stool occult blood - f/u iron studies, LDH - telemetry - npo past midnight for possible procedure tomorrow - IV fluids after prbc transfusion - f/u d-dimer (anemia is not significantly lower than baseline, though it is severe)  #CAD - no cp or sob, troponin and bnp wnl - hold aspirin and plavix given possible active bleeding, has been > 6 months since last intervention - continue statin  # HTN - here bp elevated to 160s - continue home meds, low threshold to hold if anemia worsens or bp lowers or frank bleeding begins  # Diabetes - uncontrolled, glucose 300. Bicarb wnl, asymptoamtic - SSI regular - a1c    DVT prophylaxis: mechanical, holding pharmacologic for now Code Status: full Family Communication: daughter khaden gater [phone] Disposition Plan: home Consults called: none Admission status: tele   Desma Maxim MD [facility] Pager [phone]  If 7PM-7AM, please contact night-coverage [email or web address] Password [email or web address]  06/26/2017, 8:45 PM

## 2017-06-26 NOTE — ED Notes (Signed)
Attempted report x1. 

## 2017-06-26 NOTE — Telephone Encounter (Signed)
I called and spoke with the pt's daughter. She actually suspected that he was anemic-"this what happened last time". I suggested with a Hgb of 6.5 he should go to the ED at [facility] and she agreed with this.  Kerin Ransom PA-C 06/26/2017 4:43 PM

## 2017-06-26 NOTE — ED Provider Notes (Signed)
[facility] DEPT Provider Note   CSN: [id number] Arrival date & time: 06/26/17  1739     History   Chief Complaint Chief Complaint  Patient presents with  . Shortness of Breath    HPI [name] is a 71 y.o. male.  HPI  Pt was seen at 1900. Per pt and his family, c/o gradual onset and worsening of persistent SOB for the past 5 days. SOB worsens on exertion. Pt was evaluated by his Cards MD and was told to come to the ED for further evaluation/admission for "low hemoglobin."  Pt not forthcoming regarding any further information with ED staff. Pt's family states pt told them he was having vague chest discomfort ("numbness") and left arm pain. Pt denies this to ED staff. Pt endorses hx of low Hgb level previously but "they couldn't find where" after extensive GI workup. Denies black or bloody stools, denies palpitations, no fevers, no abd pain, no N/V/D, no back pain, no focal motor weakness.    Past Medical History:  Diagnosis Date  . Angiodysplasia of small intestine 02/2016   terminal ileum  . Bleeding gastric ulcer 2000's  . Chronic anemia   . Coronary artery disease   . Diverticulosis   . GERD (gastroesophageal reflux disease)   . GI bleeding   . History of blood transfusion 2000's   "related to bleeding gastric ulcer"  . Hyperlipidemia   . Hypertension   . Non-compliance   . PVD (peripheral vascular disease) ([place])   . S/P coronary artery stent placement 10/03/2016  . Type II diabetes mellitus [facility])     Patient Active Problem List   Diagnosis Date Noted  . DOE (dyspnea on exertion) 06/25/2017  . Acute diastolic CHF (congestive heart failure) ([place]) 06/25/2017  . Hypertensive cardiovascular disease 06/25/2017  . PVC's (premature ventricular contractions) 01/26/2017  . Pre-syncope 01/25/2017  . CAD S/P percutaneous coronary angioplasty 10/03/2016  . Anemia due to chronic blood loss 02/28/2016  . Chest pain 02/28/2016  . Chest pain  with high risk for cardiac etiology 04/22/2015  . Claudication in peripheral vascular disease ([place]) 04/22/2015  . Anemia, iron deficiency 10/17/2014  . Essential hypertension 10/17/2014  . DM2 (diabetes mellitus, type 2) ([place]) 04/08/2012  . PUD (peptic ulcer disease) 11/11/2011    Past Surgical History:  Procedure Laterality Date  . CORONARY ANGIOPLASTY    . EXCISIONAL HEMORRHOIDECTOMY  1980's  . UPPER GI ENDOSCOPY  12/16/11   findings:  mild gastritis and duodenitis       Home Medications    Prior to Admission medications   Medication Sig Start Date End Date Taking? Authorizing Provider  amLODipine (NORVASC) 5 MG tablet Take 1 tablet (5 mg total) by mouth daily. 06/16/17 09/14/17 Yes Skeet Latch, MD  aspirin 81 MG tablet Take 1 tablet (81 mg total) by mouth daily. 04/24/15  Yes [name], MD  atorvastatin (LIPITOR) 40 MG tablet Take 40 mg daily by mouth.   Yes [provider]  bisoprolol (ZEBETA) 5 MG tablet Take 1 tablet (5 mg total) by mouth daily. 03/16/17  Yes Skeet Latch, MD  chlorthalidone (HYGROTON) 25 MG tablet Take 1 tablet (25 mg total) by mouth daily. 06/16/17  Yes Skeet Latch, MD  clopidogrel (PLAVIX) 75 MG tablet Take 75 mg daily by mouth.   Yes [provider]  ferrous sulfate 325 (65 FE) MG EC tablet Take 1 tablet (325 mg total) by mouth 2 (two) times daily with a meal. 06/16/17  Yes  Skeet Latch, MD  isosorbide mononitrate (IMDUR) 30 MG 24 hr tablet Take 1 tablet (30 mg total) daily by mouth. 06/25/17  Yes Kilroy, Luke K, PA-C  metFORMIN (GLUCOPHAGE-XR) 500 MG 24 hr tablet Take 1 tablet (500 mg total) by mouth daily with breakfast. ADDITIONAL REFILLS FROM PCP 06/16/17  Yes Skeet Latch, MD  glimepiride (AMARYL) 4 MG tablet Take 1 tablet (4 mg total) by mouth daily before breakfast. Additional refills from PCP 11/27/16   Skeet Latch, MD  rosuvastatin (CRESTOR) 20 MG tablet Take 1 tablet (20 mg total) by mouth daily.  06/16/17 09/14/17  Skeet Latch, MD    Family History Family History  Problem Relation Age of Onset  . CVA Unknown   . Hypertension Unknown   . Diabetes Mother   . Hypertension Father   . Peripheral Artery Disease Father   . Alcohol abuse Father   . Heart attack Father     Social History Social History   Tobacco Use  . Smoking status: Former Smoker    Packs/day: 1.00    Years: 54.00    Pack years: 54.00    Types: Cigars, Cigarettes    Last attempt to quit: 02/16/2015    Years since quitting: 2.3  . Smokeless tobacco: Former Systems developer    Types: Chew  . Tobacco comment: "chewed some when I was young"  Substance Use Topics  . Alcohol use: No    Alcohol/week: 0.0 oz    Comment: 04/24/2015 "no beer in 2 wks; ;no liquor in 3-4 weeks; I'm not a real heavy drinker" states he stopped drining 2 yrs ago  . Drug use: No     Allergies   Patient has no known allergies.   Review of Systems Review of Systems ROS: Statement: All systems negative except as marked or noted in the HPI; Constitutional: Negative for fever and chills. ; ; Eyes: Negative for eye pain, redness and discharge. ; ; ENMT: Negative for ear pain, hoarseness, nasal congestion, sinus pressure and sore throat. ; ; Cardiovascular: +vague chest pain. Negative for palpitations, diaphoresis, and peripheral edema. ; ; Respiratory: +SOB. Negative for cough, wheezing and stridor. ; ; Gastrointestinal: Negative for nausea, vomiting, diarrhea, abdominal pain, blood in stool, hematemesis, jaundice and rectal bleeding. . ; ; Genitourinary: Negative for dysuria, flank pain and hematuria. ; ; Musculoskeletal: Negative for back pain and neck pain. Negative for swelling and trauma.; ; Skin: Negative for pruritus, rash, abrasions, blisters, bruising and skin lesion.; ; Neuro: Negative for headache, lightheadedness and neck stiffness. Negative for weakness, altered level of consciousness, altered mental status, extremity weakness, paresthesias,  involuntary movement, seizure and syncope.      Physical Exam Updated Vital Signs BP (!) 162/87 (BP Location: Left Arm)   Pulse 97   Temp 98.7 F (37.1 C) (Oral)   SpO2 98%   Physical Exam 1905: Physical examination:  Nursing notes reviewed; Vital signs and O2 SAT reviewed;  Constitutional: Well developed, Well nourished, Well hydrated, In no acute distress; Head:  Normocephalic, atraumatic; Eyes: EOMI, PERRL, No scleral icterus. Conjunctiva pale.; ENMT: Mouth and pharynx normal, Mucous membranes moist; Neck: Supple, Full range of motion, No lymphadenopathy; Cardiovascular: Regular rate and rhythm, No gallop; Respiratory: Breath sounds clear & equal bilaterally, No wheezes.  Speaking full sentences with ease, Normal respiratory effort/excursion; Chest: Nontender, Movement normal; Abdomen: Soft, Nontender, Nondistended, Normal bowel sounds; Genitourinary: No CVA tenderness; Extremities: Pulses normal, No tenderness, No edema, No calf edema or asymmetry.; Neuro: AA&Ox3, variable historian. Major CN  grossly intact.  Speech clear. No gross focal motor or sensory deficits in extremities.; Skin: Color normal, Warm, Dry.   ED Treatments / Results  Labs (all labs ordered are listed, but only abnormal results are displayed)   EKG  EKG Interpretation  Date/Time:  Friday June 26 2017 17:46:35 EST Ventricular Rate:  97 PR Interval:    QRS Duration: 97 QT Interval:  339 QTC Calculation: 431 R Axis:   -20 Text Interpretation:  Sinus rhythm Borderline left axis deviation Anterior infarct, old Nonspecific T abnormalities, lateral leads Baseline wander When compared with ECG of 01/26/2017 Nonspecific T wave abnormality is now Present Confirmed by Francine Graven [phone]) on 06/26/2017 7:12:43 PM       Radiology   Procedures Procedures (including critical care time)  Medications Ordered in ED Medications  0.9 %  sodium chloride infusion (not administered)     Initial Impression /  Assessment and Plan / ED Course  I have reviewed the triage vital signs and the nursing notes.  Pertinent labs & imaging results that were available during my care of the patient were reviewed by me and considered in my medical decision making (see chart for details).  MDM Reviewed: previous chart, nursing note and vitals Reviewed previous: labs and ECG Interpretation: labs, ECG and x-ray Total time providing critical care: 30-74 minutes. This excludes time spent performing separately reportable procedures and services. Consults: admitting MD   CRITICAL CARE Performed by: Alfonzo Feller Total critical care time: 35 minutes Critical care time was exclusive of separately billable procedures and treating other patients. Critical care was necessary to treat or prevent imminent or life-threatening deterioration. Critical care was time spent personally by me on the following activities: development of treatment plan with patient and/or surrogate as well as nursing, discussions with consultants, evaluation of patient's response to treatment, examination of patient, obtaining history from patient or surrogate, ordering and performing treatments and interventions, ordering and review of laboratory studies, ordering and review of radiographic studies, pulse oximetry and re-evaluation of patient's condition.   Results for orders placed or performed during the hospital encounter of 19/14/78  Basic metabolic panel  Result Value Ref Range   Sodium 135 135 - 145 mmol/L   Potassium 4.0 3.5 - 5.1 mmol/L   Chloride 100 (L) 101 - 111 mmol/L   CO2 25 22 - 32 mmol/L   Glucose, Bld 303 (H) 65 - 99 mg/dL   BUN 20 6 - 20 mg/dL   Creatinine, Ser 1.35 (H) 0.61 - 1.24 mg/dL   Calcium 9.2 8.9 - 10.3 mg/dL   GFR calc non Af Amer 51 (L) >60 mL/min   GFR calc Af Amer 59 (L) >60 mL/min   Anion gap 10 5 - 15  CBC  Result Value Ref Range   WBC 7.5 4.0 - 10.5 K/uL   RBC 2.58 (L) 4.22 - 5.81 MIL/uL    Hemoglobin 6.0 (LL) 13.0 - 17.0 g/dL   HCT 19.5 (L) 39.0 - 52.0 %   MCV 75.6 (L) 78.0 - 100.0 fL   MCH 23.3 (L) 26.0 - 34.0 pg   MCHC 30.8 30.0 - 36.0 g/dL   RDW 20.2 (H) 11.5 - 15.5 %   Platelets 265 150 - 400 K/uL  Brain natriuretic peptide  Result Value Ref Range   B Natriuretic Peptide 14.4 0.0 - 100.0 pg/mL  CBG monitoring, ED  Result Value Ref Range   Glucose-Capillary 303 (H) 65 - 99 mg/dL   Comment 1 Notify RN  I-stat troponin, ED  Result Value Ref Range   Troponin i, poc 0.00 0.00 - 0.08 ng/mL   Comment 3          Type and screen [place]  Result Value Ref Range   ABO/RH(D) B POS    Antibody Screen NEG    Sample Expiration 06/29/2017    Dg Chest 2 View Result Date: 06/26/2017 CLINICAL DATA:  Initial evaluation for acute chest pain. EXAM: CHEST  2 VIEW COMPARISON:  Prior radiograph from 02/16/2016. FINDINGS: Mild cardiomegaly, stable. Mediastinal silhouette normal. Aortic atherosclerosis. The lungs are normally inflated. No airspace consolidation, pleural effusion, or pulmonary edema is identified. There is no pneumothorax. No acute osseous abnormality identified. IMPRESSION: 1. No active cardiopulmonary disease. 2. Mild cardiomegaly, stable. 3. Aortic atherosclerosis. Electronically Signed   By: Jeannine Boga M.D.   On: 06/26/2017 19:49    Results for GATES, JIVIDEN (MRN [id number]) as of 06/26/2017 20:17  Ref. Range 01/25/2017 16:51 01/26/2017 05:28 01/28/2017 09:57 06/25/2017 15:46 06/26/2017 18:12  Hemoglobin Latest Ref Range: 13.0 - 17.0 g/dL 8.7 (L) 7.6 (L) 8.0 (L) 6.5 (LL) 6.0 (LL)  HCT Latest Ref Range: 39.0 - 52.0 % 28.7 (L) 25.4 (L) 27.0 (L) 21.6 (L) 19.5 (L)      2010:  Pt has had previous EGD/colonoscopy studies for GI bleeding without definitive source (Dr. [name]). Pt did have a bleeding scan 02/2016 that revealed a small bowel bleeding site RLQ with f/u colonoscopy angiodysplastic lesion in terminal ileum. Unclear if pt had f/u capsule  study.  Pt himself is a variable historian regarding his symptoms and PMHx, as well as medication compliance.  Will transfuse PRBC's, admit. T/C to [facility] Dr. Si Raider, case discussed, including:  HPI, pertinent PM/SHx, VS/PE, dx testing, ED course and treatment:  Agreeable to admit.     Final Clinical Impressions(s) / ED Diagnoses   Final diagnoses:  None    ED Discharge Orders    None       Francine Graven, DO 06/30/17 2423

## 2017-06-26 NOTE — ED Triage Notes (Addendum)
Pt sent by cardiology related to worsening SOB, "chest numbness", and hemoglobin 6.5 drawn yesterday. Pt denies CP but verbalizes "leg, arm, and chest numbness when in motion." Pt denies "knowing of bleeding anywhere."

## 2017-06-26 NOTE — ED Notes (Signed)
Date and time results received: 06/26/17 1903 (use smartphrase ".now" to insert current time)  Test: Hgb Critical Value: 6.0  Name of Provider Notified: Thurnell Garbe  Orders Received? Or Actions Taken?: Actions Taken: Notified provider

## 2017-06-26 NOTE — ED Notes (Signed)
Bed: WA14 Expected date:  Expected time:  Means of arrival:  Comments: Triage 1 

## 2017-06-27 ENCOUNTER — Inpatient Hospital Stay (HOSPITAL_COMMUNITY): Payer: Medicare Other

## 2017-06-27 DIAGNOSIS — D500 Iron deficiency anemia secondary to blood loss (chronic): Secondary | ICD-10-CM

## 2017-06-27 DIAGNOSIS — I10 Essential (primary) hypertension: Secondary | ICD-10-CM

## 2017-06-27 DIAGNOSIS — R0600 Dyspnea, unspecified: Secondary | ICD-10-CM

## 2017-06-27 LAB — GLUCOSE, CAPILLARY
Glucose-Capillary: 217 mg/dL — ABNORMAL HIGH (ref 65–99)
Glucose-Capillary: 128 mg/dL — ABNORMAL HIGH (ref 65–99)
Glucose-Capillary: 185 mg/dL — ABNORMAL HIGH (ref 65–99)

## 2017-06-27 LAB — PREPARE RBC (CROSSMATCH)

## 2017-06-27 LAB — CBC
HCT: 22.4 % — ABNORMAL LOW (ref 39.0–52.0)
Hemoglobin: 7 g/dL — ABNORMAL LOW (ref 13.0–17.0)
MCH: 24.3 pg — ABNORMAL LOW (ref 26.0–34.0)
MCHC: 31.3 g/dL (ref 30.0–36.0)
MCV: 77.8 fL — ABNORMAL LOW (ref 78.0–100.0)
Platelets: 223 10*3/uL (ref 150–400)
RBC: 2.88 MIL/uL — ABNORMAL LOW (ref 4.22–5.81)
RDW: 21.6 % — ABNORMAL HIGH (ref 11.5–15.5)
WBC: 6.1 10*3/uL (ref 4.0–10.5)

## 2017-06-27 LAB — IRON AND TIBC
Iron: 13 ug/dL — ABNORMAL LOW (ref 45–182)
Saturation Ratios: 3 % — ABNORMAL LOW (ref 17.9–39.5)
TIBC: 444 ug/dL (ref 250–450)
UIBC: 431 ug/dL

## 2017-06-27 LAB — OCCULT BLOOD X 1 CARD TO LAB, STOOL: Fecal Occult Bld: POSITIVE — AB

## 2017-06-27 LAB — HEMOGLOBIN A1C
Hgb A1c MFr Bld: 8.6 % — ABNORMAL HIGH (ref 4.8–5.6)
Mean Plasma Glucose: 200.12 mg/dL

## 2017-06-27 LAB — FERRITIN: Ferritin: 27 ng/mL (ref 24–336)

## 2017-06-27 MED ORDER — SODIUM CHLORIDE 0.9 % IV SOLN
Freq: Once | INTRAVENOUS | Status: AC
  Administered 2017-06-27: 15:00:00 via INTRAVENOUS

## 2017-06-27 MED ORDER — BARIUM SULFATE 0.1 % PO SUSP
ORAL | Status: AC
  Administered 2017-06-27: 450 mL via ORAL
  Filled 2017-06-27: qty 3

## 2017-06-27 MED ORDER — IOPAMIDOL (ISOVUE-300) INJECTION 61%
125.0000 mL | Freq: Once | INTRAVENOUS | Status: AC | PRN
  Administered 2017-06-27: 125 mL via INTRAVENOUS

## 2017-06-27 MED ORDER — FERUMOXYTOL INJECTION 510 MG/17 ML
510.0000 mg | INTRAVENOUS | Status: DC
  Administered 2017-06-27: 510 mg via INTRAVENOUS
  Filled 2017-06-27 (×2): qty 17

## 2017-06-27 MED ORDER — BARIUM SULFATE 0.1 % PO SUSP
450.0000 mL | Freq: Once | ORAL | Status: AC
  Administered 2017-06-27: 450 mL via ORAL

## 2017-06-27 MED ORDER — BARIUM SULFATE 0.1 % PO SUSP
450.0000 mL | Freq: Two times a day (BID) | ORAL | Status: DC
Start: 2017-06-27 — End: 2017-06-30
  Administered 2017-06-27: 900 mL via ORAL

## 2017-06-27 MED ORDER — IOPAMIDOL (ISOVUE-300) INJECTION 61%
INTRAVENOUS | Status: AC
  Filled 2017-06-27: qty 30

## 2017-06-27 MED ORDER — PANTOPRAZOLE SODIUM 40 MG PO TBEC
40.0000 mg | DELAYED_RELEASE_TABLET | Freq: Every day | ORAL | Status: DC
  Administered 2017-06-28 – 2017-06-30 (×3): 40 mg via ORAL
  Filled 2017-06-27 (×3): qty 1

## 2017-06-27 MED ORDER — IOPAMIDOL (ISOVUE-300) INJECTION 61%
INTRAVENOUS | Status: AC
  Filled 2017-06-27: qty 100

## 2017-06-27 NOTE — Consult Note (Signed)
Consultation  Referring Provider: Dr. Carolin Sicks, [facility] Primary Care Physician:  [name], MD Primary Gastroenterologist:  Dr. [name]  Reason for Consultation:  Acute on chronic anemia  HPI: [name] is a 71 y.o. male with history of recurrent iron deficiency anemia, history of occult small bowel bleeding, concern for small bowel polyp versus mass in 2017, CAD with history of PCI on Plavix, peripheral vascular disease, chronic kidney disease, hypertension, hyperlipidemia and diabetes admitted with 2 days of worsening dyspnea on exertion found to have symptomatic acute on chronic anemia.  On admission his hemoglobin was noted to be 6.5 and when rechecked was 6.0.  He received 1 unit of blood and hemoglobin improved to 7.0  He reports that other than dyspnea of exertion he has not had any recent complaints.  He specifically denies all GI symptoms.  He denies heartburn and trouble swallowing.  Denies abdominal pain.  Good appetite.  No weight loss.  Bowel habits have been regular without blood or melena.  He has not seen any black or tarry stool though he has been taking oral iron which darkens his stool.  Bowel habits have not changed.  He reports and recalls his extensive GI workup in the summer 2017 extending to September 2017 when he had a spiral enteroscopy at [facility] with Dr. Malissa Hippo.  This was negative and he recalls being told he should consider exploratory laparotomy but he states this confused him given that the enteroscopy was negative.  He did not have further evaluation.  He has had multiple prior endoscopies and colonoscopies as documented below.  I have reviewed the studies.  Of note he had a tagged red cell scan which was positive for active bleeding in the small bowel near the right lower quadrant in June 2017.  In the same month the capsule endoscopy suggested polypoid lesions concerning for mass.  This led to the deep enteroscopy at [facility] which was negative.   Due to the fact that this was negative surgery was recommended.    Past Medical History:  Diagnosis Date  . Angiodysplasia of small intestine 02/2016   terminal ileum  . Bleeding gastric ulcer 2000's  . Chronic anemia   . Coronary artery disease   . Diverticulosis   . GERD (gastroesophageal reflux disease)   . GI bleeding   . History of blood transfusion 2000's   "related to bleeding gastric ulcer"  . Hyperlipidemia   . Hypertension   . Non-compliance   . PVD (peripheral vascular disease) ([place])   . S/P coronary artery stent placement 10/03/2016  . Type II diabetes mellitus ([place])     Past Surgical History:  Procedure Laterality Date  . CORONARY ANGIOPLASTY    . EXCISIONAL HEMORRHOIDECTOMY  1980's  . UPPER GI ENDOSCOPY  12/16/11   findings:  mild gastritis and duodenitis    Prior to Admission medications   Medication Sig Start Date End Date Taking? Authorizing Provider  amLODipine (NORVASC) 5 MG tablet Take 1 tablet (5 mg total) by mouth daily. 06/16/17 09/14/17 Yes Skeet Latch, MD  aspirin 81 MG tablet Take 1 tablet (81 mg total) by mouth daily. 04/24/15  Yes [name], MD  atorvastatin (LIPITOR) 40 MG tablet Take 40 mg daily by mouth.   Yes [provider]  bisoprolol (ZEBETA) 5 MG tablet Take 1 tablet (5 mg total) by mouth daily. 03/16/17  Yes Skeet Latch, MD  chlorthalidone (HYGROTON) 25 MG tablet Take 1 tablet (25 mg total) by mouth  daily. 06/16/17  Yes Skeet Latch, MD  clopidogrel (PLAVIX) 75 MG tablet Take 75 mg daily by mouth.   Yes [provider]  ferrous sulfate 325 (65 FE) MG EC tablet Take 1 tablet (325 mg total) by mouth 2 (two) times daily with a meal. 06/16/17  Yes Skeet Latch, MD  isosorbide mononitrate (IMDUR) 30 MG 24 hr tablet Take 1 tablet (30 mg total) daily by mouth. 06/25/17  Yes Kilroy, Luke K, PA-C  metFORMIN (GLUCOPHAGE-XR) 500 MG 24 hr tablet Take 1 tablet (500 mg total) by mouth daily with breakfast. ADDITIONAL  REFILLS FROM PCP 06/16/17  Yes Skeet Latch, MD  glimepiride (AMARYL) 4 MG tablet Take 1 tablet (4 mg total) by mouth daily before breakfast. Additional refills from PCP 11/27/16   Skeet Latch, MD  rosuvastatin (CRESTOR) 20 MG tablet Take 1 tablet (20 mg total) by mouth daily. 06/16/17 09/14/17  Skeet Latch, MD    Current Facility-Administered Medications  Medication Dose Route Frequency Provider Last Rate Last Dose  . 0.9 % NaCl with KCl 20 mEq/ L  infusion   Intravenous Continuous Gwynne Edinger, MD 100 mL/hr at 06/27/17 0150    . amLODipine (NORVASC) tablet 5 mg  5 mg Oral Daily Wouk, Ailene Rud, MD      . bisoprolol (ZEBETA) tablet 5 mg  5 mg Oral Daily Wouk, Ailene Rud, MD      . chlorthalidone (HYGROTON) tablet 25 mg  25 mg Oral Daily Wouk, Ailene Rud, MD      . insulin aspart (novoLOG) injection 0-15 Units  0-15 Units Subcutaneous TID WC Gwynne Edinger, MD   5 Units at 06/27/17 [phone]  . insulin aspart (novoLOG) injection 0-5 Units  0-5 Units Subcutaneous QHS Gwynne Edinger, MD   2 Units at 06/26/17 2323  . isosorbide mononitrate (IMDUR) 24 hr tablet 30 mg  30 mg Oral Daily Wouk, Ailene Rud, MD      . pantoprazole (PROTONIX) injection 40 mg  40 mg Intravenous Q12H Gwynne Edinger, MD   40 mg at 06/26/17 2323  . rosuvastatin (CRESTOR) tablet 20 mg  20 mg Oral Daily Wouk, Ailene Rud, MD        Allergies as of 06/26/2017  . (No Known Allergies)    Family History  Problem Relation Age of Onset  . CVA Unknown   . Hypertension Unknown   . Diabetes Mother   . Hypertension Father   . Peripheral Artery Disease Father   . Alcohol abuse Father   . Heart attack Father     Social History   Socioeconomic History  . Marital status: Married    Spouse name: Not on file  . Number of children: Not on file  . Years of education: Not on file  . Highest education level: Not on file  Social Needs  . Financial resource strain: Not on file  . Food  insecurity - worry: Not on file  . Food insecurity - inability: Not on file  . Transportation needs - medical: Not on file  . Transportation needs - non-medical: Not on file  Occupational History  . Not on file  Tobacco Use  . Smoking status: Former Smoker    Packs/day: 1.00    Years: 54.00    Pack years: 54.00    Types: Cigars, Cigarettes    Last attempt to quit: 02/16/2015    Years since quitting: 2.3  . Smokeless tobacco: Former Systems developer    Types: Chew  . Tobacco  comment: "chewed some when I was young"  Substance and Sexual Activity  . Alcohol use: No    Alcohol/week: 0.0 oz    Comment: 04/24/2015 "no beer in 2 wks; ;no liquor in 3-4 weeks; I'm not a real heavy drinker" states he stopped drining 2 yrs ago  . Drug use: No  . Sexual activity: No  Other Topics Concern  . Not on file  Social History Narrative  . Not on file    Review of Systems: As per HPI, otherwise normal  Physical Exam: Vital signs in last 24 hours: Temp:  [97.8 F (36.6 C)-98.7 F (37.1 C)] 98.4 F (36.9 C) (11/10 0700) Pulse Rate:  [76-97] 81 (11/10 0700) Resp:  [16-20] 19 (11/10 0700) BP: (128-162)/(66-87) 129/68 (11/10 0700) SpO2:  [97 %-100 %] 100 % (11/10 0700) Weight:  [89.8 kg (198 lb)] 89.8 kg (198 lb) (11/09 2154) Last BM Date: 06/26/17 General:   Alert,  Well-developed, well-nourished, pleasant and cooperative in NAD Head:  Normocephalic and atraumatic. Eyes:  Sclera clear, no icterus.   Conjunctiva pale. Mouth:  No deformity or lesions.   Neck:  Supple; no masses or thyromegaly. Lungs:  Clear throughout to auscultation.   No wheezes, crackles, or rhonchi. Heart:  Regular rate and rhythm; no murmurs, clicks, rubs,  or gallops. Abdomen:  Soft,nontender, BS active,nonpalp mass or hsm.   Rectal:  Deferred  Msk:  Symmetrical without gross deformities. . Pulses:  Normal pulses noted. Extremities:  Without clubbing or edema. Neurologic:  Alert and  oriented x4;  grossly normal  neurologically. Skin:  Intact without significant lesions or rashes.. Psych:  Alert and cooperative. Normal mood and affect.  Intake/Output from previous day: 11/09 0701 - 11/10 0700 In: 861.7 [I.V.:416.7; Blood:445] Out: -  Intake/Output this shift: Total I/O In: 0  Out: 300 [Urine:300]  Lab Results: Recent Labs    06/25/17 1546 06/26/17 1812 06/27/17 0632  WBC 7.7 7.5 6.1  HGB 6.5* 6.0* 7.0*  HCT 21.6* 19.5* 22.4*  PLT 296 265 223   BMET Recent Labs    06/26/17 1812  NA 135  K 4.0  CL 100*  CO2 25  GLUCOSE 303*  BUN 20  CREATININE 1.35*  CALCIUM 9.2   Iron/TIBC/Ferritin/ %Sat    Component Value Date/Time   IRON 13 (L) 06/26/2017 2232   TIBC 444 06/26/2017 2232   FERRITIN 27 06/26/2017 2232   IRONPCTSAT 3 (L) 06/26/2017 2232   IRONPCTSAT 14 (L) 10/17/2014 2014     Studies/Results: Deep Spiral Enteroscopy Sept 2017 at [facility] with Dr. Malissa Hippo "I had a lengthy discussion with Mr. Julian and his wife in Recovery.   I reiterated the fact that his previous VCE showed convincing evidence of a mass lesion in his small intestine that we were unable to reach with today's procedure. Though the lesion may be benign, I told them that I recommend that he see a surgeon to discuss an Ex-Lap to find and remove this lesion and that this intervention could now be targeted to the small bowel distal to the tattoo placed today. I told them that I could facilitate this at [facility] or that his local GI, Dr. [name], could arrange this locally in [place].  It was apparent that Mr. Pickney was having trouble processing and accepting this situation and so I urged him to return to see Dr. [name] in clinic or that I would be happy to see him in clinic as well.  I spoke to Dr. [name] [phone]) who  is now fully aware of the situation and says he will bring Mr. Grissett back into clinic to discuss this again.  I personally performed the service. (TP)"  Annita Brod, MD  Video capsule endoscopy  July 2017 --1-3 polypoid lesions within the small bowel, no active bleeding  Colonoscopy July 2017 --deep intubation of the TI to 40 cm, ablation of 1 ileal AVM; cecal diverticulosis; otherwise normal  Tagged Red cell scan July 2017 --positive for active bleeding in the small bowel of the right lower quadrant  Upper endoscopy April 2017 --mild duodenitis, not bleeding  EGD colonoscopy January 2012; Dr. Watt Climes, gastritis; small colon polyps which were removed  IMPRESSION:  71 y.o. male with history of recurrent iron deficiency anemia, history of occult small bowel bleeding, concern for small bowel polyp versus mass in 2017, CAD with history of PCI on Plavix, peripheral vascular disease, chronic kidney disease, hypertension, hyperlipidemia and diabetes admitted with 2 days of worsening dyspnea on exertion found to have symptomatic acute on chronic anemia.  PLAN: 1. Acute on chronic anemia/history of occult small bowel bleeding/? Of small bowel polyp versus mass lesion --   He has had no overt bleeding though is iron deficient now with acute on chronic anemia.  Prior, extensive, evaluation suggesting small bowel polyps versus mass lesion not found at deep enteroscopy.  No role for repeat endoscopy here given extensive workup previously.  Deep enteroscopy is not available here and was previously unrevealing --CT enterography to evaluate small bowel for mass lesion --Replace iron with Feraheme today; will need a second IV dose in 1 week presumably after discharge --Okay for clear liquid diet which can advance after CT --We discussed surgical exploration to "run" the small bowel but will await CT enterography findings first.  Patient not keen/excited for any surgery at this time. --We will give 1 additional unit of packed red cells to target hemoglobin of at least 8 g/dL   Lajuan Lines Schneur Crowson  06/27/2017, 9:29 AM

## 2017-06-27 NOTE — Progress Notes (Signed)
2200 dose for barium not actually due at this time per pharmacy. Pharmacy is to dc.

## 2017-06-27 NOTE — Progress Notes (Signed)
PROGRESS NOTE    [name]  GYI:[id number] DOB: November 04, 1945 DOA: 06/26/2017 PCP: [name], MD   Brief Narrative: 71 year old male with history of chronic iron deficiency anemia, occult small bowel bleeding concern for possible small bowel polyp versus mass in 2017, coronary artery disease with history of PCI on Plavix, peripheral vascular disease, chronic kidney disease, hypertension, hyperlipidemia, type 2 diabetes presented with shortness of breath, dyspnea on exertion in the setting of symptomatic acute on chronic anemia.  In ER patient was found to have hemoglobin of 6 received a unit of red blood cells with hemoglobin improved to 7.  Assessment & Plan:   #Acute on chronic anemia due to occult blood loss from his small bowel: Small bowel polyp versus mass lesion as per GI.  Patient with symptomatic anemia causing dyspnea on exertion.  Plan for another unit of blood cell transfusion today.  Patient with no bowel movement.  I consulted GI and discussed with Dr. Hilarie Fredrickson. -Plan for CT abdomen -Starting soft diet, no plan for endoscopy as per GI. -Continue Protonix -Monitor CBC.  #Chronic iron deficiency anemia: Dose of IV iron today.  Plan to discharge with oral iron.  #Hypertension: Blood pressure acceptable.  Continue current medication. -I will reduce the rate of IV fluid to 50 cc only.  Hold chlorthalidone.  Likely resume when patient has good oral intake.  #History of coronary artery disease: Patient does not have chest pain.  I will continue bisoprolol,  Imdur and Crestor.  Aspirin and Plavix currently on hold.  #Type 2 diabetes: Continue to monitor blood sugar level.  Continue sliding scale.  A1c 8.6.  DVT prophylaxis:SCD Code Status: Full code Family Communication: No family at bedside Disposition Plan: Currently admitted    Consultants:   GI  Procedures: None Antimicrobials: None  Subjective: Seen and examined at bedside.  Reported hungry and asking for  breakfast.  Denies headache, dizziness, nausea vomiting chest pain.  Has generalized weakness.  Objective: Vitals:   06/27/17 0153 06/27/17 0525 06/27/17 0700 06/27/17 1000  BP: 128/66 (!) 141/70 129/68 138/68  Pulse: 76 80 81   Resp: 18 18 19    Temp: 98.3 F (36.8 C) 98.2 F (36.8 C) 98.4 F (36.9 C)   TempSrc: Oral Oral Oral   SpO2: 100% 97% 100%   Weight:      Height:        Intake/Output Summary (Last 24 hours) at 06/27/2017 1149 Last data filed at 06/27/2017 0800 Gross per 24 hour  Intake 861.67 ml  Output 300 ml  Net 561.67 ml   Filed Weights   06/26/17 2154  Weight: 89.8 kg (198 lb)    Examination:  General exam: Appears calm and comfortable  Respiratory system: Clear to auscultation. Respiratory effort normal. No wheezing or crackle Cardiovascular system: S1 & S2 heard, RRR.  No pedal edema. Gastrointestinal system: Abdomen is nondistended, soft and nontender. Normal bowel sounds heard. Central nervous system: Alert and oriented. No focal neurological deficits. Extremities: Symmetric 5 x 5 power. Skin: No rashes, lesions or ulcers Psychiatry: Judgement and insight appear normal. Mood & affect appropriate.     Data Reviewed: I have personally reviewed following labs and imaging studies  CBC: Recent Labs  Lab 06/25/17 1546 06/26/17 1812 06/27/17 0632  WBC 7.7 7.5 6.1  HGB 6.5* 6.0* 7.0*  HCT 21.6* 19.5* 22.4*  MCV 76* 75.6* 77.8*  PLT 296 265 350   Basic Metabolic Panel: Recent Labs  Lab 06/22/17 0946 06/26/17 1812  NA  137 135  K 4.9 4.0  CL 100 100*  CO2 23 25  GLUCOSE 336* 303*  BUN 19 20  CREATININE 1.31* 1.35*  CALCIUM 9.3 9.2   GFR: Estimated Creatinine Clearance: 53.9 mL/min (A) (by C-G formula based on SCr of 1.35 mg/dL (H)). Liver Function Tests: Recent Labs  Lab 06/22/17 0946  AST 10  ALT 7  ALKPHOS 43  BILITOT 0.3  PROT 6.9  ALBUMIN 4.0   No results for input(s): LIPASE, AMYLASE in the last 168 hours. No results for  input(s): AMMONIA in the last 168 hours. Coagulation Profile: No results for input(s): INR, PROTIME in the last 168 hours. Cardiac Enzymes: No results for input(s): CKTOTAL, CKMB, CKMBINDEX, TROPONINI in the last 168 hours. BNP (last 3 results) No results for input(s): PROBNP in the last 8760 hours. HbA1C: Recent Labs    06/26/17 2232  HGBA1C 8.6*   CBG: Recent Labs  Lab 06/26/17 1748 06/26/17 2230 06/27/17 0752  GLUCAP 303* 218* 217*   Lipid Profile: No results for input(s): CHOL, HDL, LDLCALC, TRIG, CHOLHDL, LDLDIRECT in the last 72 hours. Thyroid Function Tests: No results for input(s): TSH, T4TOTAL, FREET4, T3FREE, THYROIDAB in the last 72 hours. Anemia Panel: Recent Labs    06/26/17 2232  FERRITIN 27  TIBC 444  IRON 13*   Sepsis Labs: No results for input(s): PROCALCITON, LATICACIDVEN in the last 168 hours.  No results found for this or any previous visit (from the past 240 hour(s)).       Radiology Studies: Dg Chest 2 View  Result Date: 06/26/2017 CLINICAL DATA:  Initial evaluation for acute chest pain. EXAM: CHEST  2 VIEW COMPARISON:  Prior radiograph from 02/16/2016. FINDINGS: Mild cardiomegaly, stable. Mediastinal silhouette normal. Aortic atherosclerosis. The lungs are normally inflated. No airspace consolidation, pleural effusion, or pulmonary edema is identified. There is no pneumothorax. No acute osseous abnormality identified. IMPRESSION: 1. No active cardiopulmonary disease. 2. Mild cardiomegaly, stable. 3. Aortic atherosclerosis. Electronically Signed   By: Jeannine Boga M.D.   On: 06/26/2017 19:49        Scheduled Meds: . amLODipine  5 mg Oral Daily  . barium      . bisoprolol  5 mg Oral Daily  . chlorthalidone  25 mg Oral Daily  . insulin aspart  0-15 Units Subcutaneous TID WC  . insulin aspart  0-5 Units Subcutaneous QHS  . iopamidol      . iopamidol      . isosorbide mononitrate  30 mg Oral Daily  . pantoprazole  40 mg Oral  Q0600  . rosuvastatin  20 mg Oral Daily   Continuous Infusions: . sodium chloride    . 0.9 % NaCl with KCl 20 mEq / L 100 mL/hr at 06/27/17 0150  . ferumoxytol       LOS: 1 day    Elynore Dolinski Tanna Furry, MD [facility] Pager [phone]  If 7PM-7AM, please contact night-coverage [email or web address] Password [email or web address] 06/27/2017, 11:49 AM

## 2017-06-28 DIAGNOSIS — Z7901 Long term (current) use of anticoagulants: Secondary | ICD-10-CM

## 2017-06-28 DIAGNOSIS — K6389 Other specified diseases of intestine: Secondary | ICD-10-CM

## 2017-06-28 DIAGNOSIS — R933 Abnormal findings on diagnostic imaging of other parts of digestive tract: Secondary | ICD-10-CM

## 2017-06-28 DIAGNOSIS — R531 Weakness: Secondary | ICD-10-CM

## 2017-06-28 LAB — GLUCOSE, CAPILLARY
Glucose-Capillary: 173 mg/dL — ABNORMAL HIGH (ref 65–99)
Glucose-Capillary: 237 mg/dL — ABNORMAL HIGH (ref 65–99)
Glucose-Capillary: 221 mg/dL — ABNORMAL HIGH (ref 65–99)
Glucose-Capillary: 160 mg/dL — ABNORMAL HIGH (ref 65–99)

## 2017-06-28 LAB — CBC
HCT: 25.9 % — ABNORMAL LOW (ref 39.0–52.0)
Hemoglobin: 8.2 g/dL — ABNORMAL LOW (ref 13.0–17.0)
MCH: 25.5 pg — ABNORMAL LOW (ref 26.0–34.0)
MCHC: 31.7 g/dL (ref 30.0–36.0)
MCV: 80.4 fL (ref 78.0–100.0)
Platelets: 215 10*3/uL (ref 150–400)
RBC: 3.22 MIL/uL — ABNORMAL LOW (ref 4.22–5.81)
RDW: 21.5 % — ABNORMAL HIGH (ref 11.5–15.5)
WBC: 6.6 10*3/uL (ref 4.0–10.5)

## 2017-06-28 NOTE — Progress Notes (Signed)
Patient ID: [name], male   DOB: 04-Mar-1946, 71 y.o.   MRN: [id number]     Progress Note   Subjective    Up in chair - says he feels ok - no GI complaints No Bm's HGB 8.2 after transfusions Last plavix dose Thursday    Objective   Vital signs in last 24 hours: Temp:  [98.2 F (36.8 C)-99 F (37.2 C)] 98.5 F (36.9 C) (11/11 0536) Pulse Rate:  [60-70] 60 (11/11 0536) Resp:  [18] 18 (11/11 0536) BP: (120-142)/(52-84) 126/68 (11/11 0536) SpO2:  [99 %-100 %] 100 % (11/11 0536) Last BM Date: 06/27/17 General:    AA male in NAD, up in chair Heart:  Regular rate and rhythm; no murmurs Lungs: Respirations even and unlabored, lungs CTA bilaterally Abdomen:  Soft, nontender and nondistended. Normal bowel sounds. Extremities:  Without edema. Neurologic:  Alert and oriented,  grossly normal neurologically. Psych:  Cooperative. Normal mood and affect.  Intake/Output from previous day: 11/10 0701 - 11/11 0700 In: 2589.2 [P.O.:460; I.V.:1499.2; Blood:630] Out: 1252 [Urine:1251; Stool:1] Intake/Output this shift: Total I/O In: 240 [P.O.:240] Out: -   Lab Results: Recent Labs    06/26/17 1812 06/27/17 0632 06/28/17 0635  WBC 7.5 6.1 6.6  HGB 6.0* 7.0* 8.2*  HCT 19.5* 22.4* 25.9*  PLT 265 223 215   BMET Recent Labs    06/26/17 1812  NA 135  K 4.0  CL 100*  CO2 25  GLUCOSE 303*  BUN 20  CREATININE 1.35*  CALCIUM 9.2   LFT No results for input(s): PROT, ALBUMIN, AST, ALT, ALKPHOS, BILITOT, BILIDIR, IBILI in the last 72 hours. PT/INR No results for input(s): LABPROT, INR in the last 72 hours.  Studies/Results: Dg Chest 2 View  Result Date: 06/26/2017 CLINICAL DATA:  Initial evaluation for acute chest pain. EXAM: CHEST  2 VIEW COMPARISON:  Prior radiograph from 02/16/2016. FINDINGS: Mild cardiomegaly, stable. Mediastinal silhouette normal. Aortic atherosclerosis. The lungs are normally inflated. No airspace consolidation, pleural effusion, or pulmonary edema  is identified. There is no pneumothorax. No acute osseous abnormality identified. IMPRESSION: 1. No active cardiopulmonary disease. 2. Mild cardiomegaly, stable. 3. Aortic atherosclerosis. Electronically Signed   By: Jeannine Boga M.D.   On: 06/26/2017 19:49   Ct Entero Abd/pelvis W Contast  Result Date: 06/27/2017 CLINICAL DATA:  Low hemoglobin. Small bowel GI bleed on nuclear medicine bleeding scan 02/28/2016. Negative endoscopy. EXAM: CT ABDOMEN AND PELVIS WITH CONTRAST (ENTEROGRAPHY) TECHNIQUE: Multidetector CT of the abdomen and pelvis during bolus administration of intravenous contrast. Negative oral contrast was given. CONTRAST:  189mL ISOVUE-300 IOPAMIDOL (ISOVUE-300) INJECTION 61% COMPARISON:  Bleeding scan 02/28/2016 FINDINGS: Lower chest: Lung bases are clear. Hepatobiliary: Hepatic cyst the RIGHT hepatic lobe. No duct dilatation. Normal gallbladder Pancreas: Pancreas is normal. No ductal dilatation. No pancreatic inflammation. Spleen: Normal spleen Adrenals/urinary tract: Adrenal glands and kidneys are normal. Nonenhancing cysts in the lower pole RIGHT kidney The ureters and bladder normal. Stomach/Bowel: The GI tract is distended by the negative oral contrast. Stomach and duodenum are normal. Normal mucosa of the jejunum. Normal mucosal pattern of the ileum. No evidence of bowel dilatation. No foci of active gastrointestinal bleeding are identified. No Mass lesion identified within the small bowel. Terminal ileum is normal. One short segment of small bowel wall thickening with enhancement enhancement found in the RIGHT lower quad quadrant (image 339, series 5. ; sagittal image 43). There is no dilatation proximal or distal to this question lesion. This is the same location as  the the mild active small bowel bleeding identified on bleeding scan 02/28/2016. Terminal ileum is normal. Appendix normal. The ascending, transverse, and descending colon are normal. Vascular/Lymphatic: Abdominal aorta  is normal caliber with atherosclerotic calcification. There is no retroperitoneal or periportal lymphadenopathy. No pelvic lymphadenopathy. Reproductive: Prostate normal Other: No free fluid. Musculoskeletal: No aggressive osseous lesion. IMPRESSION: 1. Short 2 cm segment of focal small bowel wall thickening and enhancement. No specific mass lesion identified. No obstruction associated this lesion. Differential would include angioectasia (or other vascular lesion), physiologic nondistention, primary small-bowel neoplasm and lymphoma. Same location as positive small bowel bleeding scan on 02/28/2016. 2. Otherwise no abnormality small bowel or colon. 3.  Aortic Atherosclerosis (ICD10-I70.0). Electronically Signed   By: Suzy Bouchard M.D.   On: 06/27/2017 14:59       Assessment / Plan:    #1  71 yo AA male on chronic Plavix with hx of recurrent Fe  deficiency anemia, admitted Friday with worsening weakness and dyspnea on exertion and found to have hemoglobin 6.5. Patient had extensive GI workup in the summer of 2017 and was eventually referred to [facility] for spiral enteroscopy. This was negative and patient was told that he should have exploratory laparotomy, he did not seek further evaluation as he was confused by the "exploratory" terminology.  Tagged bleeding scan summer 2017 was positive for activity in the small bowel near the right lower quadrant.  CT enterography done yesterday confirms a short segment of small bowel wall thickening with enhancement found in the right lower quadrant, there is no dilation proximal or distal to this questionable lesion, this is the same location that was identified to be positive on previous bleeding scan.. Terminal ileum normal, appendix normal.  #2 history of coronary artery disease status post previous PCI #2 chronic kidney disease #3 hypertension #4 adult-onset diabetes mellitus  Plan; Patient needs to have surgery and resection of the diseased segment of  small bowel found in the right lower quadrant on CT enterography. It is not clear whether this represents a small tumor.  Long discussion with the patient today and he is agreeable to surgery now that he understands that there is a lesion that is causing his recurrent bleeding that needs to be removed. He is willing to stay in the hospital. He has been off of Plavix for 4 days today Have asked general surgery to see him. Continue serial hemoglobins and transfuse to keep hemoglobin in the 8-9 range given  his underlying coronary artery disease     Contact  Hermena Swint, P.A.-C               [phone]      Active Problems:   DM2 (diabetes mellitus, type 2) (HCC)   Anemia, iron deficiency   Essential hypertension   CAD S/P percutaneous coronary angioplasty   DOE (dyspnea on exertion)   Iron deficiency anemia due to chronic blood loss   Mass of small intestine with bleeding ?angiodysplasia   Chronic anticoagulation     LOS: 2 days   Chaylee Ehrsam  06/28/2017, 10:31 AM

## 2017-06-28 NOTE — Progress Notes (Signed)
PROGRESS NOTE    [name]  NID:[id number] DOB: 1946/08/14 DOA: 06/26/2017 PCP: [name], MD   Brief Narrative: 71 year old male with history of chronic iron deficiency anemia, occult small bowel bleeding concern for possible small bowel polyp versus mass in 2017, coronary artery disease with history of PCI on Plavix, peripheral vascular disease, chronic kidney disease, hypertension, hyperlipidemia, type 2 diabetes presented with shortness of breath, dyspnea on exertion in the setting of symptomatic acute on chronic anemia.  In ER patient was found to have hemoglobin of 6 received a unit of red blood cells with hemoglobin improved to 7.  Assessment & Plan:   #Acute on chronic anemia due to occult blood loss from his small bowel: Small bowel polyp versus mass lesion as per GI.  Patient with symptomatic anemia causing dyspnea on exertion on admission.  -Patient received 2 units of red blood cell transfusion with stable hemoglobin. -CT scan with a small bowel lesion concerning for neoplasm, site of bleeding.  General surgery was consulted by GI.  Continue current management and follow-up with GI and surgery's recommendation. -Patient with no bowel movement and denies any bleeding.  Monitor serial CBCs. -Continue Protonix -I have discussed with the GI team this morning.  #Chronic iron deficiency anemia: Received IV iron and 2 units of red blood cell transfusion.  Monitor CBC.  #Hypertension: Blood pressure acceptable.  Continue current medication.  #History of coronary artery disease: Patient does not have chest pain.  I will continue bisoprolol,  Imdur and Crestor.  Aspirin and Plavix currently on hold.  #Type 2 diabetes: Continue to monitor blood sugar level.  Continue sliding scale.  A1c 8.6.  DVT prophylaxis:SCD Code Status: Full code Family Communication: No family at bedside Disposition Plan: Currently admitted    Consultants:   GI  Procedures: None Antimicrobials:  None  Subjective: Seen and examined at bedside.  Denies headache, dizziness, nausea vomiting chest pain shortness of breath.  Objective: Vitals:   06/27/17 1456 06/27/17 1757 06/27/17 2153 06/28/17 0536  BP: (!) 127/52 140/74 127/62 126/68  Pulse: 70 64 64 60  Resp: 18 18 18 18   Temp: 98.3 F (36.8 C) 98.7 F (37.1 C) 99 F (37.2 C) 98.5 F (36.9 C)  TempSrc: Oral Oral Oral Oral  SpO2: 100% 100% 99% 100%  Weight:      Height:        Intake/Output Summary (Last 24 hours) at 06/28/2017 1108 Last data filed at 06/28/2017 1443 Gross per 24 hour  Intake 2829.17 ml  Output 952 ml  Net 1877.17 ml   Filed Weights   06/26/17 2154  Weight: 89.8 kg (198 lb)    Examination:  General exam: Sitting on chair comfortable, not in distress Respiratory system: Clear bilateral, respiratory effort normal Cardiovascular system: Regular rate rhythm, S1-S2 normal.  No erythema Gastrointestinal system: Abdomen soft, nontender, nondistended.  Bowel sounds positive. Central nervous system: Alert and oriented. No focal neurological deficits. Skin: No rashes, lesions or ulcers Psychiatry: Judgement and insight appear normal. Mood & affect appropriate.     Data Reviewed: I have personally reviewed following labs and imaging studies  CBC: Recent Labs  Lab 06/25/17 1546 06/26/17 1812 06/27/17 0632 06/28/17 0635  WBC 7.7 7.5 6.1 6.6  HGB 6.5* 6.0* 7.0* 8.2*  HCT 21.6* 19.5* 22.4* 25.9*  MCV 76* 75.6* 77.8* 80.4  PLT 296 265 223 154   Basic Metabolic Panel: Recent Labs  Lab 06/22/17 0946 06/26/17 1812  NA 137 135  K 4.9  4.0  CL 100 100*  CO2 23 25  GLUCOSE 336* 303*  BUN 19 20  CREATININE 1.31* 1.35*  CALCIUM 9.3 9.2   GFR: Estimated Creatinine Clearance: 53.9 mL/min (A) (by C-G formula based on SCr of 1.35 mg/dL (H)). Liver Function Tests: Recent Labs  Lab 06/22/17 0946  AST 10  ALT 7  ALKPHOS 43  BILITOT 0.3  PROT 6.9  ALBUMIN 4.0   No results for input(s):  LIPASE, AMYLASE in the last 168 hours. No results for input(s): AMMONIA in the last 168 hours. Coagulation Profile: No results for input(s): INR, PROTIME in the last 168 hours. Cardiac Enzymes: No results for input(s): CKTOTAL, CKMB, CKMBINDEX, TROPONINI in the last 168 hours. BNP (last 3 results) No results for input(s): PROBNP in the last 8760 hours. HbA1C: Recent Labs    06/26/17 2232  HGBA1C 8.6*   CBG: Recent Labs  Lab 06/26/17 2230 06/27/17 0752 06/27/17 1259 06/27/17 1700 06/28/17 0746  GLUCAP 218* 217* 128* 185* 173*   Lipid Profile: No results for input(s): CHOL, HDL, LDLCALC, TRIG, CHOLHDL, LDLDIRECT in the last 72 hours. Thyroid Function Tests: No results for input(s): TSH, T4TOTAL, FREET4, T3FREE, THYROIDAB in the last 72 hours. Anemia Panel: Recent Labs    06/26/17 2232  FERRITIN 27  TIBC 444  IRON 13*   Sepsis Labs: No results for input(s): PROCALCITON, LATICACIDVEN in the last 168 hours.  No results found for this or any previous visit (from the past 240 hour(s)).       Radiology Studies: Dg Chest 2 View  Result Date: 06/26/2017 CLINICAL DATA:  Initial evaluation for acute chest pain. EXAM: CHEST  2 VIEW COMPARISON:  Prior radiograph from 02/16/2016. FINDINGS: Mild cardiomegaly, stable. Mediastinal silhouette normal. Aortic atherosclerosis. The lungs are normally inflated. No airspace consolidation, pleural effusion, or pulmonary edema is identified. There is no pneumothorax. No acute osseous abnormality identified. IMPRESSION: 1. No active cardiopulmonary disease. 2. Mild cardiomegaly, stable. 3. Aortic atherosclerosis. Electronically Signed   By: Jeannine Boga M.D.   On: 06/26/2017 19:49   Ct Entero Abd/pelvis W Contast  Result Date: 06/27/2017 CLINICAL DATA:  Low hemoglobin. Small bowel GI bleed on nuclear medicine bleeding scan 02/28/2016. Negative endoscopy. EXAM: CT ABDOMEN AND PELVIS WITH CONTRAST (ENTEROGRAPHY) TECHNIQUE:  Multidetector CT of the abdomen and pelvis during bolus administration of intravenous contrast. Negative oral contrast was given. CONTRAST:  173mL ISOVUE-300 IOPAMIDOL (ISOVUE-300) INJECTION 61% COMPARISON:  Bleeding scan 02/28/2016 FINDINGS: Lower chest: Lung bases are clear. Hepatobiliary: Hepatic cyst the RIGHT hepatic lobe. No duct dilatation. Normal gallbladder Pancreas: Pancreas is normal. No ductal dilatation. No pancreatic inflammation. Spleen: Normal spleen Adrenals/urinary tract: Adrenal glands and kidneys are normal. Nonenhancing cysts in the lower pole RIGHT kidney The ureters and bladder normal. Stomach/Bowel: The GI tract is distended by the negative oral contrast. Stomach and duodenum are normal. Normal mucosa of the jejunum. Normal mucosal pattern of the ileum. No evidence of bowel dilatation. No foci of active gastrointestinal bleeding are identified. No Mass lesion identified within the small bowel. Terminal ileum is normal. One short segment of small bowel wall thickening with enhancement enhancement found in the RIGHT lower quad quadrant (image 339, series 5. ; sagittal image 43). There is no dilatation proximal or distal to this question lesion. This is the same location as the the mild active small bowel bleeding identified on bleeding scan 02/28/2016. Terminal ileum is normal. Appendix normal. The ascending, transverse, and descending colon are normal. Vascular/Lymphatic: Abdominal aorta is normal  caliber with atherosclerotic calcification. There is no retroperitoneal or periportal lymphadenopathy. No pelvic lymphadenopathy. Reproductive: Prostate normal Other: No free fluid. Musculoskeletal: No aggressive osseous lesion. IMPRESSION: 1. Short 2 cm segment of focal small bowel wall thickening and enhancement. No specific mass lesion identified. No obstruction associated this lesion. Differential would include angioectasia (or other vascular lesion), physiologic nondistention, primary  small-bowel neoplasm and lymphoma. Same location as positive small bowel bleeding scan on 02/28/2016. 2. Otherwise no abnormality small bowel or colon. 3.  Aortic Atherosclerosis (ICD10-I70.0). Electronically Signed   By: Suzy Bouchard M.D.   On: 06/27/2017 14:59        Scheduled Meds: . amLODipine  5 mg Oral Daily  . barium  450 mL Oral BID  . bisoprolol  5 mg Oral Daily  . insulin aspart  0-15 Units Subcutaneous TID WC  . insulin aspart  0-5 Units Subcutaneous QHS  . isosorbide mononitrate  30 mg Oral Daily  . pantoprazole  40 mg Oral Q0600  . rosuvastatin  20 mg Oral Daily   Continuous Infusions: . 0.9 % NaCl with KCl 20 mEq / L 50 mL/hr at 06/27/17 1814  . ferumoxytol Stopped (06/27/17 1356)     LOS: 2 days    Keishawn Rajewski Tanna Furry, MD [facility] Pager [phone]  If 7PM-7AM, please contact night-coverage [email or web address] Password [email or web address] 06/28/2017, 11:08 AM

## 2017-06-28 NOTE — Consult Note (Signed)
Reason for Consult: GI bleed Referring Physician: [name] is an 71 y.o. male.  HPI:  Pt is a 71 yo M who was admitted to the hospital 11/9 with dyspnea on exertion.  He was found to be quite anemic.  He has had GI bleeds before.  He was workup at [place] in the summer of 2017.  He had an enteroscopy at that point.  He had a positive bleeding scan last summer in the RLQ, but bleeding stopped.  He denies pain, nausea, or vomiting.  He denies weight loss.  He has had several colonoscopies.  He has history of ulcer in the past.  He had EGD with Dr. [name] last April that was negative other than mild duodenitis.  He had a capsule endoscopy in July 2017 which was positive for 3 polypoid lesions in the small bowel.  Push enteroscopy performed at [facility], but I am not able to see the report.  Last year he had a significant bleed all at once.  This time he just started getting weak and short of breath.    Past Medical History:  Diagnosis Date  . Angiodysplasia of small intestine 02/2016   terminal ileum  . Bleeding gastric ulcer 2000's  . Chronic anemia   . Coronary artery disease   . Diverticulosis   . GERD (gastroesophageal reflux disease)   . GI bleeding   . History of blood transfusion 2000's   "related to bleeding gastric ulcer"  . Hyperlipidemia   . Hypertension   . Non-compliance   . PVD (peripheral vascular disease) ([place])   . S/P coronary artery stent placement 10/03/2016  . Type II diabetes mellitus ([place])     Past Surgical History:  Procedure Laterality Date  . CORONARY ANGIOPLASTY    . EXCISIONAL HEMORRHOIDECTOMY  1980's  . UPPER GI ENDOSCOPY  12/16/11   findings:  mild gastritis and duodenitis    Family History  Problem Relation Age of Onset  . CVA Unknown   . Hypertension Unknown   . Diabetes Mother   . Hypertension Father   . Peripheral Artery Disease Father   . Alcohol abuse Father   . Heart attack Father     Social History:  reports that he quit smoking about  2 years ago. His smoking use included cigars and cigarettes. He has a 54.00 pack-year smoking history. He has quit using smokeless tobacco. His smokeless tobacco use included chew. He reports that he does not drink alcohol or use drugs.  Allergies: No Known Allergies  Medications:  Prior to Admission:  Medications Prior to Admission  Medication Sig Dispense Refill Last Dose  . amLODipine (NORVASC) 5 MG tablet Take 1 tablet (5 mg total) by mouth daily. 30 tablet 5 06/26/2017 at Unknown time  . aspirin 81 MG tablet Take 1 tablet (81 mg total) by mouth daily. 30 tablet 11 06/26/2017 at Unknown time  . atorvastatin (LIPITOR) 40 MG tablet Take 40 mg daily by mouth.   06/25/2017 at Unknown time  . bisoprolol (ZEBETA) 5 MG tablet Take 1 tablet (5 mg total) by mouth daily. 30 tablet 5 06/25/2017 at 0900  . chlorthalidone (HYGROTON) 25 MG tablet Take 1 tablet (25 mg total) by mouth daily. 30 tablet 5 06/25/2017 at Unknown time  . clopidogrel (PLAVIX) 75 MG tablet Take 75 mg daily by mouth.   06/26/2017 at Unknown time  . ferrous sulfate 325 (65 FE) MG EC tablet Take 1 tablet (325 mg total) by mouth 2 (two)  times daily with a meal. 60 tablet 5 06/26/2017 at Unknown time  . isosorbide mononitrate (IMDUR) 30 MG 24 hr tablet Take 1 tablet (30 mg total) daily by mouth. 30 tablet 3 06/26/2017 at Unknown time  . metFORMIN (GLUCOPHAGE-XR) 500 MG 24 hr tablet Take 1 tablet (500 mg total) by mouth daily with breakfast. ADDITIONAL REFILLS FROM PCP 30 tablet 1 06/26/2017 at Unknown time  . glimepiride (AMARYL) 4 MG tablet Take 1 tablet (4 mg total) by mouth daily before breakfast. Additional refills from PCP 30 tablet 0 Taking  . rosuvastatin (CRESTOR) 20 MG tablet Take 1 tablet (20 mg total) by mouth daily. 30 tablet 5     Results for orders placed or performed during the hospital encounter of 06/26/17 (from the past 48 hour(s))  CBG monitoring, ED     Status: Abnormal   Collection Time: 06/26/17  5:48 PM  Result Value  Ref Range   Glucose-Capillary 303 (H) 65 - 99 mg/dL   Comment 1 Notify RN   Basic metabolic panel     Status: Abnormal   Collection Time: 06/26/17  6:12 PM  Result Value Ref Range   Sodium 135 135 - 145 mmol/L   Potassium 4.0 3.5 - 5.1 mmol/L   Chloride 100 (L) 101 - 111 mmol/L   CO2 25 22 - 32 mmol/L   Glucose, Bld 303 (H) 65 - 99 mg/dL   BUN 20 6 - 20 mg/dL   Creatinine, Ser 1.35 (H) 0.61 - 1.24 mg/dL   Calcium 9.2 8.9 - 10.3 mg/dL   GFR calc non Af Amer 51 (L) >60 mL/min   GFR calc Af Amer 59 (L) >60 mL/min    Comment: (NOTE) The eGFR has been calculated using the CKD EPI equation. This calculation has not been validated in all clinical situations. eGFR's persistently <60 mL/min signify possible Chronic Kidney Disease.    Anion gap 10 5 - 15  CBC     Status: Abnormal   Collection Time: 06/26/17  6:12 PM  Result Value Ref Range   WBC 7.5 4.0 - 10.5 K/uL   RBC 2.58 (L) 4.22 - 5.81 MIL/uL   Hemoglobin 6.0 (LL) 13.0 - 17.0 g/dL    Comment: CRITICAL RESULT CALLED TO, READ BACK BY AND VERIFIED WITH: S THORNTON AT 1847 ON 11.09.2018 BY NBROOKS    HCT 19.5 (L) 39.0 - 52.0 %   MCV 75.6 (L) 78.0 - 100.0 fL   MCH 23.3 (L) 26.0 - 34.0 pg   MCHC 30.8 30.0 - 36.0 g/dL   RDW 20.2 (H) 11.5 - 15.5 %   Platelets 265 150 - 400 K/uL  Type and screen [place]     Status: None (Preliminary result)   Collection Time: 06/26/17  6:12 PM  Result Value Ref Range   ABO/RH(D) B POS    Antibody Screen NEG    Sample Expiration 06/29/2017    Unit Number [id number]    Blood Component Type RED CELLS,LR    Unit division 00    Status of Unit ISSUED    Transfusion Status OK TO TRANSFUSE    Crossmatch Result Compatible    Unit Number [id number]    Blood Component Type RED CELLS,LR    Unit division 00    Status of Unit ISSUED,FINAL    Transfusion Status OK TO TRANSFUSE    Crossmatch Result Compatible   Brain natriuretic peptide     Status: None   Collection Time:  06/26/17  6:12  PM  Result Value Ref Range   B Natriuretic Peptide 14.4 0.0 - 100.0 pg/mL  I-stat troponin, ED     Status: None   Collection Time: 06/26/17  6:31 PM  Result Value Ref Range   Troponin i, poc 0.00 0.00 - 0.08 ng/mL   Comment 3            Comment: Due to the release kinetics of cTnI, a negative result within the first hours of the onset of symptoms does not rule out myocardial infarction with certainty. If myocardial infarction is still suspected, repeat the test at appropriate intervals.   Prepare RBC     Status: None   Collection Time: 06/26/17  9:08 PM  Result Value Ref Range   Order Confirmation ORDER PROCESSED BY BLOOD BANK   Glucose, capillary     Status: Abnormal   Collection Time: 06/26/17 10:30 PM  Result Value Ref Range   Glucose-Capillary 218 (H) 65 - 99 mg/dL  Hemoglobin A1c     Status: Abnormal   Collection Time: 06/26/17 10:32 PM  Result Value Ref Range   Hgb A1c MFr Bld 8.6 (H) 4.8 - 5.6 %    Comment: (NOTE) Pre diabetes:          5.7%-6.4% Diabetes:              >6.4% Glycemic control for   <7.0% adults with diabetes    Mean Plasma Glucose 200.12 mg/dL    Comment: Performed at [facility], [place] [address] [place], [place] [address]  Iron and TIBC     Status: Abnormal   Collection Time: 06/26/17 10:32 PM  Result Value Ref Range   Iron 13 (L) 45 - 182 ug/dL   TIBC 444 250 - 450 ug/dL   Saturation Ratios 3 (L) 17.9 - 39.5 %   UIBC 431 ug/dL    Comment: Performed at [facility], [place] [address] [place], [place] [address]  Ferritin     Status: None   Collection Time: 06/26/17 10:32 PM  Result Value Ref Range   Ferritin 27 24 - 336 ng/mL    Comment: Performed at [facility] [address] [place], [place] [address]  Lactate dehydrogenase     Status: Abnormal   Collection Time: 06/26/17 10:32 PM  Result Value Ref Range   LDH 93 (L) 98 - 192 U/L  D-dimer, quantitative (not at [facility])     Status: None   Collection Time:  06/26/17 10:32 PM  Result Value Ref Range   D-Dimer, Quant 0.48 0.00 - 0.50 ug/mL-FEU    Comment: (NOTE) At the manufacturer cut-off of 0.50 ug/mL FEU, this assay has been documented to exclude PE with a sensitivity and negative predictive value of 97 to 99%.  At this time, this assay has not been approved by the FDA to exclude DVT/VTE. Results should be correlated with clinical presentation.   Prepare RBC     Status: None   Collection Time: 06/26/17 10:33 PM  Result Value Ref Range   Order Confirmation ORDER PROCESSED BY BLOOD BANK   CBC     Status: Abnormal   Collection Time: 06/27/17  6:32 AM  Result Value Ref Range   WBC 6.1 4.0 - 10.5 K/uL   RBC 2.88 (L) 4.22 - 5.81 MIL/uL   Hemoglobin 7.0 (L) 13.0 - 17.0 g/dL   HCT 22.4 (L) 39.0 - 52.0 %   MCV 77.8 (L) 78.0 - 100.0 fL  MCH 24.3 (L) 26.0 - 34.0 pg   MCHC 31.3 30.0 - 36.0 g/dL   RDW 21.6 (H) 11.5 - 15.5 %   Platelets 223 150 - 400 K/uL  Glucose, capillary     Status: Abnormal   Collection Time: 06/27/17  7:52 AM  Result Value Ref Range   Glucose-Capillary 217 (H) 65 - 99 mg/dL  Prepare RBC     Status: None   Collection Time: 06/27/17 11:24 AM  Result Value Ref Range   Order Confirmation ORDER PROCESSED BY BLOOD BANK   Glucose, capillary     Status: Abnormal   Collection Time: 06/27/17 12:59 PM  Result Value Ref Range   Glucose-Capillary 128 (H) 65 - 99 mg/dL   Comment 1 Notify RN   Occult blood card to lab, stool     Status: Abnormal   Collection Time: 06/27/17  2:54 PM  Result Value Ref Range   Fecal Occult Bld POSITIVE (A) NEGATIVE  Glucose, capillary     Status: Abnormal   Collection Time: 06/27/17  5:00 PM  Result Value Ref Range   Glucose-Capillary 185 (H) 65 - 99 mg/dL   Comment 1 Notify RN   CBC     Status: Abnormal   Collection Time: 06/28/17  6:35 AM  Result Value Ref Range   WBC 6.6 4.0 - 10.5 K/uL   RBC 3.22 (L) 4.22 - 5.81 MIL/uL   Hemoglobin 8.2 (L) 13.0 - 17.0 g/dL   HCT 25.9 (L) 39.0 - 52.0 %    MCV 80.4 78.0 - 100.0 fL   MCH 25.5 (L) 26.0 - 34.0 pg   MCHC 31.7 30.0 - 36.0 g/dL   RDW 21.5 (H) 11.5 - 15.5 %   Platelets 215 150 - 400 K/uL  Glucose, capillary     Status: Abnormal   Collection Time: 06/28/17  7:46 AM  Result Value Ref Range   Glucose-Capillary 173 (H) 65 - 99 mg/dL   Comment 1 Notify RN   Glucose, capillary     Status: Abnormal   Collection Time: 06/28/17 11:59 AM  Result Value Ref Range   Glucose-Capillary 237 (H) 65 - 99 mg/dL   Comment 1 Notify RN     Dg Chest 2 View  Result Date: 06/26/2017 CLINICAL DATA:  Initial evaluation for acute chest pain. EXAM: CHEST  2 VIEW COMPARISON:  Prior radiograph from 02/16/2016. FINDINGS: Mild cardiomegaly, stable. Mediastinal silhouette normal. Aortic atherosclerosis. The lungs are normally inflated. No airspace consolidation, pleural effusion, or pulmonary edema is identified. There is no pneumothorax. No acute osseous abnormality identified. IMPRESSION: 1. No active cardiopulmonary disease. 2. Mild cardiomegaly, stable. 3. Aortic atherosclerosis. Electronically Signed   By: Jeannine Boga M.D.   On: 06/26/2017 19:49   Ct Entero Abd/pelvis W Contast  Result Date: 06/27/2017 CLINICAL DATA:  Low hemoglobin. Small bowel GI bleed on nuclear medicine bleeding scan 02/28/2016. Negative endoscopy. EXAM: CT ABDOMEN AND PELVIS WITH CONTRAST (ENTEROGRAPHY) TECHNIQUE: Multidetector CT of the abdomen and pelvis during bolus administration of intravenous contrast. Negative oral contrast was given. CONTRAST:  148m ISOVUE-300 IOPAMIDOL (ISOVUE-300) INJECTION 61% COMPARISON:  Bleeding scan 02/28/2016 FINDINGS: Lower chest: Lung bases are clear. Hepatobiliary: Hepatic cyst the RIGHT hepatic lobe. No duct dilatation. Normal gallbladder Pancreas: Pancreas is normal. No ductal dilatation. No pancreatic inflammation. Spleen: Normal spleen Adrenals/urinary tract: Adrenal glands and kidneys are normal. Nonenhancing cysts in the lower pole RIGHT  kidney The ureters and bladder normal. Stomach/Bowel: The GI tract is distended by the negative oral  contrast. Stomach and duodenum are normal. Normal mucosa of the jejunum. Normal mucosal pattern of the ileum. No evidence of bowel dilatation. No foci of active gastrointestinal bleeding are identified. No Mass lesion identified within the small bowel. Terminal ileum is normal. One short segment of small bowel wall thickening with enhancement enhancement found in the RIGHT lower quad quadrant (image 339, series 5. ; sagittal image 43). There is no dilatation proximal or distal to this question lesion. This is the same location as the the mild active small bowel bleeding identified on bleeding scan 02/28/2016. Terminal ileum is normal. Appendix normal. The ascending, transverse, and descending colon are normal. Vascular/Lymphatic: Abdominal aorta is normal caliber with atherosclerotic calcification. There is no retroperitoneal or periportal lymphadenopathy. No pelvic lymphadenopathy. Reproductive: Prostate normal Other: No free fluid. Musculoskeletal: No aggressive osseous lesion. IMPRESSION: 1. Short 2 cm segment of focal small bowel wall thickening and enhancement. No specific mass lesion identified. No obstruction associated this lesion. Differential would include angioectasia (or other vascular lesion), physiologic nondistention, primary small-bowel neoplasm and lymphoma. Same location as positive small bowel bleeding scan on 02/28/2016. 2. Otherwise no abnormality small bowel or colon. 3.  Aortic Atherosclerosis (ICD10-I70.0). Electronically Signed   By: Suzy Bouchard M.D.   On: 06/27/2017 14:59    Review of Systems  Constitutional: Negative for chills, diaphoresis, fever, malaise/fatigue and weight loss.  HENT: Negative.   Eyes: Negative.   Respiratory: Negative.   Cardiovascular: Negative.   Gastrointestinal: Positive for blood in stool.  Genitourinary: Negative.   Musculoskeletal: Negative.    Skin: Negative.   Neurological: Negative.  Negative for weakness.  Endo/Heme/Allergies: Negative.   Psychiatric/Behavioral: Negative.   All other systems reviewed and are negative.    Blood pressure 126/68, pulse 60, temperature 98.5 F (36.9 C), temperature source Oral, resp. rate 18, height '5\' 8"'  (1.727 m), weight 89.8 kg (198 lb), SpO2 100 %.   Physical Exam  Constitutional: He is oriented to person, place, and time. He appears well-developed and well-nourished. No distress.  HENT:  Head: Normocephalic and atraumatic.  Mouth/Throat: Oropharynx is clear and moist.  Eyes: Conjunctivae are normal. Pupils are equal, round, and reactive to light.  Neck: Normal range of motion. Neck supple. No tracheal deviation present. No thyromegaly present.  Cardiovascular: Normal rate, regular rhythm and intact distal pulses.  Respiratory: Effort normal and breath sounds normal. No respiratory distress. He exhibits no tenderness.  GI: Soft. He exhibits no distension and no mass. There is no tenderness. There is no rebound and no guarding.  Musculoskeletal: Normal range of motion. He exhibits no edema, tenderness or deformity.  Neurological: He is alert and oriented to person, place, and time. Coordination normal.  Skin: Skin is warm and dry. No rash noted. He is not diaphoretic. No erythema. No pallor.  Psychiatric: He has a normal mood and affect. His behavior is normal. Judgment and thought content normal.     Assessment/Plan: Chronic blood loss anemia Abnormal section of small bowel  The CT enterography showed a segment of small bowel that appears to be exactly at the location of the positive bleeding scan from last year.  He also had a positive capsule endoscopy.  Will discuss with Dr. Marcello Moores consideration of resection this week.   It is possible that abnormality would not be found.   I reviewed with the patient that this could be a tumor or related to chronic inflammation.  I also  discussed risk of negative surgery.   Will follow.  [name] 06/28/2017, 12:50 PM

## 2017-06-29 DIAGNOSIS — R933 Abnormal findings on diagnostic imaging of other parts of digestive tract: Secondary | ICD-10-CM

## 2017-06-29 LAB — TYPE AND SCREEN
ABO/RH(D): B POS
Antibody Screen: NEGATIVE
Unit division: 0
Unit division: 0

## 2017-06-29 LAB — BPAM RBC
Blood Product Expiration Date: 201812012359
Blood Product Expiration Date: 201812012359
ISSUE DATE / TIME: 201811092255
ISSUE DATE / TIME: 201811101430
Unit Type and Rh: 7300
Unit Type and Rh: 7300

## 2017-06-29 LAB — CBC
HCT: 27.1 % — ABNORMAL LOW (ref 39.0–52.0)
Hemoglobin: 8.5 g/dL — ABNORMAL LOW (ref 13.0–17.0)
MCH: 25.4 pg — ABNORMAL LOW (ref 26.0–34.0)
MCHC: 31.4 g/dL (ref 30.0–36.0)
MCV: 81.1 fL (ref 78.0–100.0)
Platelets: 215 10*3/uL (ref 150–400)
RBC: 3.34 MIL/uL — ABNORMAL LOW (ref 4.22–5.81)
RDW: 22.5 % — ABNORMAL HIGH (ref 11.5–15.5)
WBC: 7.1 10*3/uL (ref 4.0–10.5)

## 2017-06-29 LAB — SURGICAL PCR SCREEN
MRSA, PCR: NEGATIVE
Staphylococcus aureus: NEGATIVE

## 2017-06-29 LAB — GLUCOSE, CAPILLARY
Glucose-Capillary: 174 mg/dL — ABNORMAL HIGH (ref 65–99)
Glucose-Capillary: 200 mg/dL — ABNORMAL HIGH (ref 65–99)
Glucose-Capillary: 107 mg/dL — ABNORMAL HIGH (ref 65–99)
Glucose-Capillary: 277 mg/dL — ABNORMAL HIGH (ref 65–99)

## 2017-06-29 MED ORDER — CEFAZOLIN SODIUM-DEXTROSE 2-4 GM/100ML-% IV SOLN
2.0000 g | INTRAVENOUS | Status: AC
  Administered 2017-06-30: 2 g via INTRAVENOUS

## 2017-06-29 MED ORDER — CEFAZOLIN SODIUM-DEXTROSE 2-4 GM/100ML-% IV SOLN
2.0000 g | INTRAVENOUS | Status: DC
  Filled 2017-06-29: qty 100

## 2017-06-29 NOTE — Progress Notes (Signed)
GI bleeding  Subjective: Pt with no complaints  Objective: Vital signs in last 24 hours: Temp:  [98.2 F (36.8 C)-98.4 F (36.9 C)] 98.3 F (36.8 C) (11/12 0446) Pulse Rate:  [58-68] 62 (11/12 0446) Resp:  [18-20] 18 (11/12 0446) BP: (119-151)/(64-84) 151/76 (11/12 0446) SpO2:  [97 %-99 %] 99 % (11/12 0805) Last BM Date: 06/27/17  Intake/Output from previous day: 11/11 0701 - 11/12 0700 In: 1940 [P.O.:720; I.V.:1220] Out: 1800 [Urine:1800] Intake/Output this shift: Total I/O In: -  Out: 850 [Urine:850]  General appearance: alert and cooperative GI: soft, non-distended  Lab Results:  Results for orders placed or performed during the hospital encounter of 06/26/17 (from the past 24 hour(s))  Glucose, capillary     Status: Abnormal   Collection Time: 06/28/17 11:59 AM  Result Value Ref Range   Glucose-Capillary 237 (H) 65 - 99 mg/dL   Comment 1 Notify RN   Glucose, capillary     Status: Abnormal   Collection Time: 06/28/17  5:17 PM  Result Value Ref Range   Glucose-Capillary 221 (H) 65 - 99 mg/dL   Comment 1 Notify RN   Glucose, capillary     Status: Abnormal   Collection Time: 06/28/17  9:16 PM  Result Value Ref Range   Glucose-Capillary 160 (H) 65 - 99 mg/dL  CBC     Status: Abnormal   Collection Time: 06/29/17  5:22 AM  Result Value Ref Range   WBC 7.1 4.0 - 10.5 K/uL   RBC 3.34 (L) 4.22 - 5.81 MIL/uL   Hemoglobin 8.5 (L) 13.0 - 17.0 g/dL   HCT 27.1 (L) 39.0 - 52.0 %   MCV 81.1 78.0 - 100.0 fL   MCH 25.4 (L) 26.0 - 34.0 pg   MCHC 31.4 30.0 - 36.0 g/dL   RDW 22.5 (H) 11.5 - 15.5 %   Platelets 215 150 - 400 K/uL  Glucose, capillary     Status: Abnormal   Collection Time: 06/29/17  7:45 AM  Result Value Ref Range   Glucose-Capillary 174 (H) 65 - 99 mg/dL     Studies/Results Radiology     MEDS, Scheduled . amLODipine  5 mg Oral Daily  . barium  450 mL Oral BID  . bisoprolol  5 mg Oral Daily  . insulin aspart  0-15 Units Subcutaneous TID WC  .  insulin aspart  0-5 Units Subcutaneous QHS  . isosorbide mononitrate  30 mg Oral Daily  . pantoprazole  40 mg Oral Q0600  . rosuvastatin  20 mg Oral Daily     Assessment: GI bleeding: CT enterography shows a possible mass in his distal small bowel.  Plan: I have reviewed the CT.  Mass appears resectable.  Will plan on OR either tom or Wed depending on OR schedule for lap small bowel resection.    LOS: 3 days    [name], [facility], [place] [phone]   06/29/2017 10:00 AM

## 2017-06-29 NOTE — Progress Notes (Signed)
Inpatient Diabetes Program Recommendations  AACE/ADA: New Consensus Statement on Inpatient Glycemic Control (2015)  Target Ranges:  Prepandial:   less than 140 mg/dL      Peak postprandial:   less than 180 mg/dL (1-2 hours)      Critically ill patients:  140 - 180 mg/dL   Results for JEREMIAH, CURCI (MRN [id number]) as of 06/29/2017 12:55  Ref. Range 06/28/2017 07:46 06/28/2017 11:59 06/28/2017 17:17 06/28/2017 21:16  Glucose-Capillary Latest Ref Range: 65 - 99 mg/dL 173 (H) 237 (H) 221 (H) 160 (H)   Results for LEONARDO, MAKRIS (MRN [id number]) as of 06/29/2017 12:55  Ref. Range 06/29/2017 07:45 06/29/2017 11:57  Glucose-Capillary Latest Ref Range: 65 - 99 mg/dL 174 (H) 200 (H)    Home DM Meds: Amaryl 4 mg daily       Metformin 500 mg daily  Current Insulin Orders: Novolog Moderate Correction Scale/ SSI (0-15 units) TID AC + HS       MD- Please consider the following in-hospital insulin adjustments while home oral DM meds are on hold:  Start low dose basal insulin:  Recommend Lantus 9 units daily (0.1 units/kg dosing)      --Will follow patient during hospitalization--  Wyn Quaker RN, MSN, CDE Diabetes Coordinator Inpatient Glycemic Control Team Team Pager: [phone] (8a-5p)

## 2017-06-29 NOTE — Progress Notes (Signed)
PROGRESS NOTE    [name]  ZDG:[id number] DOB: 1945/11/14 DOA: 06/26/2017 PCP: [name], MD   Brief Narrative: 71 year old male with history of chronic iron deficiency anemia, occult small bowel bleeding concern for possible small bowel polyp versus mass in 2017, coronary artery disease with history of PCI on Plavix, peripheral vascular disease, chronic kidney disease, hypertension, hyperlipidemia, type 2 diabetes presented with shortness of breath, dyspnea on exertion in the setting of symptomatic acute on chronic anemia.  In ER patient was found to have hemoglobin of 6 received a unit of red blood cells with hemoglobin improved to 7.  Assessment & Plan:   #Acute on chronic anemia due to occult blood loss from his small bowel: Small bowel polyp versus mass lesion as per GI.  Patient with symptomatic anemia causing dyspnea on exertion on admission.  -Patient received 2 units of red blood cell transfusion with stable hemoglobin. -CT scan with a small bowel lesion concerning for neoplasm, site of bleeding.   -Plan for a small bowel resection at the site of lesion by general surgery.  Continue supportive care.  Tolerating soft diet.  #Chronic iron deficiency anemia: Received IV iron and 2 units of red blood cell transfusion.  Monitor CBC.  #Hypertension: Blood pressure acceptable.  Continue current medication.  #History of coronary artery disease: Patient does not have chest pain.  I will continue bisoprolol,  Imdur and Crestor.  Aspirin and Plavix currently on hold.  #Type 2 diabetes: Continue to monitor blood sugar level.  Continue sliding scale.  A1c 8.6.  DVT prophylaxis:SCD Code Status: Full code Family Communication: No family at bedside Disposition Plan: Currently admitted    Consultants:   GI  Procedures: None Antimicrobials: None  Subjective: Seen and examined at bedside.  Denies nausea vomiting chest pain shortness of breath or abdominal  pain. Objective: Vitals:   06/28/17 1408 06/28/17 2118 06/29/17 0446 06/29/17 0805  BP: 119/64 (!) 151/84 (!) 151/76   Pulse: (!) 58 68 62   Resp: 20 18 18    Temp: 98.2 F (36.8 C) 98.4 F (36.9 C) 98.3 F (36.8 C)   TempSrc: Oral Oral Oral   SpO2: 98% 98% 97% 99%  Weight:      Height:        Intake/Output Summary (Last 24 hours) at 06/29/2017 1303 Last data filed at 06/29/2017 0900 Gross per 24 hour  Intake 1700 ml  Output 1950 ml  Net -250 ml   Filed Weights   06/26/17 2154  Weight: 89.8 kg (198 lb)    Examination:  General exam: Not in distress Respiratory system: Clear bilateral, respiratory effort normal Cardiovascular system: Regular rate rhythm, S1 is normal. Gastrointestinal system: Abdomen soft, nontender, nondistended.  Bowel sounds sluggish. Central nervous system: Alert and oriented. No focal neurological deficits. Skin: No rashes, lesions or ulcers Psychiatry: Judgement and insight appear normal. Mood & affect appropriate.     Data Reviewed: I have personally reviewed following labs and imaging studies  CBC: Recent Labs  Lab 06/25/17 1546 06/26/17 1812 06/27/17 0632 06/28/17 0635 06/29/17 0522  WBC 7.7 7.5 6.1 6.6 7.1  HGB 6.5* 6.0* 7.0* 8.2* 8.5*  HCT 21.6* 19.5* 22.4* 25.9* 27.1*  MCV 76* 75.6* 77.8* 80.4 81.1  PLT 296 265 223 215 951   Basic Metabolic Panel: Recent Labs  Lab 06/26/17 1812  NA 135  K 4.0  CL 100*  CO2 25  GLUCOSE 303*  BUN 20  CREATININE 1.35*  CALCIUM 9.2   GFR:  Estimated Creatinine Clearance: 53.9 mL/min (A) (by C-G formula based on SCr of 1.35 mg/dL (H)). Liver Function Tests: No results for input(s): AST, ALT, ALKPHOS, BILITOT, PROT, ALBUMIN in the last 168 hours. No results for input(s): LIPASE, AMYLASE in the last 168 hours. No results for input(s): AMMONIA in the last 168 hours. Coagulation Profile: No results for input(s): INR, PROTIME in the last 168 hours. Cardiac Enzymes: No results for input(s):  CKTOTAL, CKMB, CKMBINDEX, TROPONINI in the last 168 hours. BNP (last 3 results) No results for input(s): PROBNP in the last 8760 hours. HbA1C: Recent Labs    06/26/17 2232  HGBA1C 8.6*   CBG: Recent Labs  Lab 06/28/17 1159 06/28/17 1717 06/28/17 2116 06/29/17 0745 06/29/17 1157  GLUCAP 237* 221* 160* 174* 200*   Lipid Profile: No results for input(s): CHOL, HDL, LDLCALC, TRIG, CHOLHDL, LDLDIRECT in the last 72 hours. Thyroid Function Tests: No results for input(s): TSH, T4TOTAL, FREET4, T3FREE, THYROIDAB in the last 72 hours. Anemia Panel: Recent Labs    06/26/17 2232  FERRITIN 27  TIBC 444  IRON 13*   Sepsis Labs: No results for input(s): PROCALCITON, LATICACIDVEN in the last 168 hours.  No results found for this or any previous visit (from the past 240 hour(s)).       Radiology Studies: No results found.      Scheduled Meds: . amLODipine  5 mg Oral Daily  . barium  450 mL Oral BID  . bisoprolol  5 mg Oral Daily  . insulin aspart  0-15 Units Subcutaneous TID WC  . insulin aspart  0-5 Units Subcutaneous QHS  . isosorbide mononitrate  30 mg Oral Daily  . pantoprazole  40 mg Oral Q0600  . rosuvastatin  20 mg Oral Daily   Continuous Infusions: . 0.9 % NaCl with KCl 20 mEq / L 50 mL/hr at 06/29/17 1003  . [START ON 06/30/2017]  ceFAZolin (ANCEF) IV    . ferumoxytol Stopped (06/27/17 1356)     LOS: 3 days    Darnel Mchan Tanna Furry, MD [facility] Pager [phone]  If 7PM-7AM, please contact night-coverage [email or web address] Password [email or web address] 06/29/2017, 1:03 PM

## 2017-06-30 ENCOUNTER — Inpatient Hospital Stay (HOSPITAL_COMMUNITY): Payer: Medicare Other | Admitting: Certified Registered Nurse Anesthetist

## 2017-06-30 ENCOUNTER — Inpatient Hospital Stay (HOSPITAL_COMMUNITY): Payer: Medicare Other

## 2017-06-30 ENCOUNTER — Encounter (HOSPITAL_COMMUNITY)
Admission: EM | Disposition: A | Discharge status: Home / Self Care | Payer: Self-pay | Source: Home / Self Care | Attending: Nephrology

## 2017-06-30 ENCOUNTER — Encounter (HOSPITAL_COMMUNITY): Payer: Self-pay | Admitting: Certified Registered Nurse Anesthetist

## 2017-06-30 HISTORY — PX: LAPAROSCOPIC SMALL BOWEL RESECTION: SHX5929

## 2017-06-30 LAB — CBC
HCT: 28.3 % — ABNORMAL LOW (ref 39.0–52.0)
Hemoglobin: 8.8 g/dL — ABNORMAL LOW (ref 13.0–17.0)
MCH: 25.6 pg — ABNORMAL LOW (ref 26.0–34.0)
MCHC: 31.1 g/dL (ref 30.0–36.0)
MCV: 82.3 fL (ref 78.0–100.0)
Platelets: 227 10*3/uL (ref 150–400)
RBC: 3.44 MIL/uL — ABNORMAL LOW (ref 4.22–5.81)
RDW: 22.6 % — ABNORMAL HIGH (ref 11.5–15.5)
WBC: 6.8 10*3/uL (ref 4.0–10.5)

## 2017-06-30 LAB — BASIC METABOLIC PANEL
Anion gap: 6 (ref 5–15)
BUN: 11 mg/dL (ref 6–20)
CO2: 25 mmol/L (ref 22–32)
Calcium: 9.1 mg/dL (ref 8.9–10.3)
Chloride: 107 mmol/L (ref 101–111)
Creatinine, Ser: 1.25 mg/dL — ABNORMAL HIGH (ref 0.61–1.24)
GFR calc Af Amer: >60 mL/min (ref >60)
GFR calc non Af Amer: 56 mL/min — ABNORMAL LOW (ref >60)
Glucose, Bld: 161 mg/dL — ABNORMAL HIGH (ref 65–99)
Potassium: 4 mmol/L (ref 3.5–5.1)
Sodium: 138 mmol/L (ref 135–145)

## 2017-06-30 LAB — GLUCOSE, CAPILLARY
Glucose-Capillary: 238 mg/dL — ABNORMAL HIGH (ref 65–99)
Glucose-Capillary: 250 mg/dL — ABNORMAL HIGH (ref 65–99)
Glucose-Capillary: 209 mg/dL — ABNORMAL HIGH (ref 65–99)
Glucose-Capillary: 221 mg/dL — ABNORMAL HIGH (ref 65–99)

## 2017-06-30 SURGERY — EXCISION, SMALL INTESTINE, LAPAROSCOPIC
Anesthesia: General

## 2017-06-30 MED ORDER — PROPOFOL 10 MG/ML IV BOLUS
INTRAVENOUS | Status: DC | PRN
  Administered 2017-06-30: 150 mg via INTRAVENOUS

## 2017-06-30 MED ORDER — INSULIN ASPART 100 UNIT/ML ~~LOC~~ SOLN
5.0000 [IU] | Freq: Once | SUBCUTANEOUS | Status: AC
  Administered 2017-06-30: 5 [IU] via SUBCUTANEOUS

## 2017-06-30 MED ORDER — INSULIN ASPART 100 UNIT/ML ~~LOC~~ SOLN
0.0000 [IU] | Freq: Four times a day (QID) | SUBCUTANEOUS | Status: DC
  Administered 2017-06-30 (×2): 5 [IU] via SUBCUTANEOUS
  Administered 2017-07-01: 3 [IU] via SUBCUTANEOUS
  Administered 2017-07-01: 5 [IU] via SUBCUTANEOUS
  Administered 2017-07-01: 3 [IU] via SUBCUTANEOUS
  Administered 2017-07-01 – 2017-07-02 (×2): 5 [IU] via SUBCUTANEOUS
  Administered 2017-07-02: 3 [IU] via SUBCUTANEOUS
  Administered 2017-07-02: 2 [IU] via SUBCUTANEOUS
  Administered 2017-07-02: 3 [IU] via SUBCUTANEOUS
  Administered 2017-07-03: 5 [IU] via SUBCUTANEOUS
  Administered 2017-07-03: 2 [IU] via SUBCUTANEOUS
  Administered 2017-07-03: 5 [IU] via SUBCUTANEOUS
  Administered 2017-07-04: 2 [IU] via SUBCUTANEOUS

## 2017-06-30 MED ORDER — PANTOPRAZOLE SODIUM 40 MG IV SOLR
40.0000 mg | INTRAVENOUS | Status: DC
  Administered 2017-06-30 – 2017-07-03 (×4): 40 mg via INTRAVENOUS
  Filled 2017-06-30 (×4): qty 40

## 2017-06-30 MED ORDER — LACTATED RINGERS IV SOLN
INTRAVENOUS | Status: DC | PRN
  Administered 2017-06-30: 07:00:00 via INTRAVENOUS

## 2017-06-30 MED ORDER — ALBUMIN HUMAN 5 % IV SOLN
INTRAVENOUS | Status: AC
  Filled 2017-06-30: qty 250

## 2017-06-30 MED ORDER — SUCCINYLCHOLINE CHLORIDE 200 MG/10ML IV SOSY
PREFILLED_SYRINGE | INTRAVENOUS | Status: DC | PRN
  Administered 2017-06-30: 120 mg via INTRAVENOUS

## 2017-06-30 MED ORDER — MORPHINE SULFATE (PF) 4 MG/ML IV SOLN
2.0000 mg | INTRAVENOUS | Status: DC | PRN
  Administered 2017-06-30: 2 mg via INTRAVENOUS
  Administered 2017-07-01: 4 mg via INTRAVENOUS
  Filled 2017-06-30 (×2): qty 1

## 2017-06-30 MED ORDER — 0.9 % SODIUM CHLORIDE (POUR BTL) OPTIME
TOPICAL | Status: DC | PRN
  Administered 2017-06-30: 2000 mL

## 2017-06-30 MED ORDER — MEPERIDINE HCL 50 MG/ML IJ SOLN: 6.2500 mg | INTRAMUSCULAR | Status: DC | PRN

## 2017-06-30 MED ORDER — ACETAMINOPHEN 10 MG/ML IV SOLN
1000.0000 mg | Freq: Four times a day (QID) | INTRAVENOUS | Status: AC
  Administered 2017-06-30 – 2017-07-01 (×4): 1000 mg via INTRAVENOUS
  Filled 2017-06-30 (×5): qty 100

## 2017-06-30 MED ORDER — DEXAMETHASONE SODIUM PHOSPHATE 10 MG/ML IJ SOLN
INTRAMUSCULAR | Status: DC | PRN
  Administered 2017-06-30: 10 mg via INTRAVENOUS

## 2017-06-30 MED ORDER — LIDOCAINE 2% (20 MG/ML) 5 ML SYRINGE
INTRAMUSCULAR | Status: AC
  Filled 2017-06-30: qty 10

## 2017-06-30 MED ORDER — BUPIVACAINE LIPOSOME 1.3 % IJ SUSP
20.0000 mL | Freq: Once | INTRAMUSCULAR | Status: AC
  Administered 2017-06-30: 20 mL
  Filled 2017-06-30: qty 20

## 2017-06-30 MED ORDER — DEXAMETHASONE SODIUM PHOSPHATE 10 MG/ML IJ SOLN
INTRAMUSCULAR | Status: AC
  Filled 2017-06-30: qty 1

## 2017-06-30 MED ORDER — INSULIN ASPART 100 UNIT/ML ~~LOC~~ SOLN
SUBCUTANEOUS | Status: AC
  Filled 2017-06-30: qty 1

## 2017-06-30 MED ORDER — PROMETHAZINE HCL 25 MG/ML IJ SOLN: 6.2500 mg | INTRAMUSCULAR | Status: DC | PRN

## 2017-06-30 MED ORDER — ROCURONIUM BROMIDE 10 MG/ML (PF) SYRINGE
PREFILLED_SYRINGE | INTRAVENOUS | Status: DC | PRN
  Administered 2017-06-30: 50 mg via INTRAVENOUS

## 2017-06-30 MED ORDER — FENTANYL CITRATE (PF) 100 MCG/2ML IJ SOLN
INTRAMUSCULAR | Status: AC
  Filled 2017-06-30: qty 2

## 2017-06-30 MED ORDER — ONDANSETRON HCL 4 MG/2ML IJ SOLN
INTRAMUSCULAR | Status: DC | PRN
  Administered 2017-06-30: 4 mg via INTRAVENOUS

## 2017-06-30 MED ORDER — BUPIVACAINE-EPINEPHRINE 0.25% -1:200000 IJ SOLN
INTRAMUSCULAR | Status: DC | PRN
Start: 2017-06-30 — End: 2017-06-30
  Administered 2017-06-30: 30 mL

## 2017-06-30 MED ORDER — SUGAMMADEX SODIUM 200 MG/2ML IV SOLN
INTRAVENOUS | Status: DC | PRN
  Administered 2017-06-30: 200 mg via INTRAVENOUS

## 2017-06-30 MED ORDER — HYDROMORPHONE HCL 1 MG/ML IJ SOLN
0.2500 mg | INTRAMUSCULAR | Status: DC | PRN
  Administered 2017-06-30: 0.5 mg via INTRAVENOUS

## 2017-06-30 MED ORDER — FENTANYL CITRATE (PF) 100 MCG/2ML IJ SOLN
INTRAMUSCULAR | Status: DC | PRN
  Administered 2017-06-30 (×2): 50 ug via INTRAVENOUS
  Administered 2017-06-30: 100 ug via INTRAVENOUS

## 2017-06-30 MED ORDER — SUGAMMADEX SODIUM 200 MG/2ML IV SOLN
INTRAVENOUS | Status: AC
  Filled 2017-06-30: qty 2

## 2017-06-30 MED ORDER — LIDOCAINE 2% (20 MG/ML) 5 ML SYRINGE
INTRAMUSCULAR | Status: DC | PRN
  Administered 2017-06-30: 1.5 mg/kg/h via INTRAVENOUS

## 2017-06-30 MED ORDER — POTASSIUM CHLORIDE IN NACL 20-0.9 MEQ/L-% IV SOLN
INTRAVENOUS | Status: DC
  Administered 2017-06-30 – 2017-07-03 (×6): via INTRAVENOUS
  Filled 2017-06-30 (×7): qty 1000

## 2017-06-30 MED ORDER — ALBUMIN HUMAN 5 % IV SOLN
INTRAVENOUS | Status: DC | PRN
  Administered 2017-06-30: 08:00:00 via INTRAVENOUS

## 2017-06-30 MED ORDER — METOPROLOL TARTRATE 5 MG/5ML IV SOLN: 5.0000 mg | Freq: Four times a day (QID) | INTRAVENOUS | Status: DC | PRN

## 2017-06-30 MED ORDER — CEFAZOLIN SODIUM-DEXTROSE 2-4 GM/100ML-% IV SOLN
INTRAVENOUS | Status: AC
  Filled 2017-06-30: qty 100

## 2017-06-30 MED ORDER — SUGAMMADEX SODIUM 500 MG/5ML IV SOLN
INTRAVENOUS | Status: AC
  Filled 2017-06-30: qty 5

## 2017-06-30 MED ORDER — ONDANSETRON HCL 4 MG/2ML IJ SOLN
INTRAMUSCULAR | Status: AC
  Filled 2017-06-30: qty 2

## 2017-06-30 MED ORDER — ROCURONIUM BROMIDE 50 MG/5ML IV SOSY
PREFILLED_SYRINGE | INTRAVENOUS | Status: AC
  Filled 2017-06-30: qty 5

## 2017-06-30 MED ORDER — EPHEDRINE 5 MG/ML INJ
INTRAVENOUS | Status: AC
  Filled 2017-06-30: qty 10

## 2017-06-30 MED ORDER — LACTATED RINGERS IR SOLN
Status: DC | PRN
  Administered 2017-06-30: 1000 mL

## 2017-06-30 MED ORDER — HYDROMORPHONE HCL 1 MG/ML IJ SOLN
INTRAMUSCULAR | Status: AC
  Filled 2017-06-30: qty 2

## 2017-06-30 MED ORDER — SUCCINYLCHOLINE CHLORIDE 200 MG/10ML IV SOSY
PREFILLED_SYRINGE | INTRAVENOUS | Status: AC
  Filled 2017-06-30: qty 10

## 2017-06-30 MED ORDER — BUPIVACAINE-EPINEPHRINE (PF) 0.25% -1:200000 IJ SOLN
INTRAMUSCULAR | Status: AC
  Filled 2017-06-30: qty 30

## 2017-06-30 MED ORDER — EPHEDRINE SULFATE-NACL 50-0.9 MG/10ML-% IV SOSY
PREFILLED_SYRINGE | INTRAVENOUS | Status: DC | PRN
  Administered 2017-06-30: 15 mg via INTRAVENOUS
  Administered 2017-06-30: 10 mg via INTRAVENOUS

## 2017-06-30 MED ORDER — LIDOCAINE 2% (20 MG/ML) 5 ML SYRINGE
INTRAMUSCULAR | Status: DC | PRN
  Administered 2017-06-30: 100 mg via INTRAVENOUS

## 2017-06-30 MED ORDER — LACTATED RINGERS IV SOLN: INTRAVENOUS | Status: DC

## 2017-06-30 MED ORDER — PROPOFOL 10 MG/ML IV BOLUS
INTRAVENOUS | Status: AC
  Filled 2017-06-30: qty 20

## 2017-06-30 SURGICAL SUPPLY — 59 items
APPLIER CLIP 5 13 M/L LIGAMAX5 (MISCELLANEOUS)
APPLIER CLIP ROT 10 11.4 M/L (STAPLE)
BLADE EXTENDED COATED 6.5IN (ELECTRODE) IMPLANT
CABLE HIGH FREQUENCY MONO STRZ (ELECTRODE) ×2 IMPLANT
CELLS DAT CNTRL 66122 CELL SVR (MISCELLANEOUS) IMPLANT
CLIP APPLIE 5 13 M/L LIGAMAX5 (MISCELLANEOUS) IMPLANT
CLIP APPLIE ROT 10 11.4 M/L (STAPLE) IMPLANT
COUNTER NEEDLE 20 DBL MAG RED (NEEDLE) ×2 IMPLANT
COVER MAYO STAND STRL (DRAPES) ×6 IMPLANT
DECANTER SPIKE VIAL GLASS SM (MISCELLANEOUS) ×2 IMPLANT
DERMABOND ADVANCED (GAUZE/BANDAGES/DRESSINGS) ×1
DERMABOND ADVANCED .7 DNX12 (GAUZE/BANDAGES/DRESSINGS) ×1 IMPLANT
DRAIN CHANNEL 19F RND (DRAIN) IMPLANT
DRAPE LAPAROSCOPIC ABDOMINAL (DRAPES) ×2 IMPLANT
DRSG OPSITE POSTOP 4X10 (GAUZE/BANDAGES/DRESSINGS) IMPLANT
DRSG OPSITE POSTOP 4X6 (GAUZE/BANDAGES/DRESSINGS) ×2 IMPLANT
DRSG OPSITE POSTOP 4X8 (GAUZE/BANDAGES/DRESSINGS) IMPLANT
ELECT REM PT RETURN 15FT ADLT (MISCELLANEOUS) ×2 IMPLANT
EVACUATOR SILICONE 100CC (DRAIN) IMPLANT
GAUZE SPONGE 4X4 12PLY STRL (GAUZE/BANDAGES/DRESSINGS) IMPLANT
GLOVE BIOGEL PI IND STRL 7.0 (GLOVE) ×1 IMPLANT
GLOVE BIOGEL PI INDICATOR 7.0 (GLOVE) ×1
IRRIG SUCT STRYKERFLOW 2 WTIP (MISCELLANEOUS) ×2
IRRIGATION SUCT STRKRFLW 2 WTP (MISCELLANEOUS) ×1 IMPLANT
LEGGING LITHOTOMY PAIR STRL (DRAPES) IMPLANT
LIGASURE IMPACT 36 18CM CVD LR (INSTRUMENTS) ×2 IMPLANT
LUBRICANT JELLY K Y 4OZ (MISCELLANEOUS) IMPLANT
PACK COLON (CUSTOM PROCEDURE TRAY) ×2 IMPLANT
PAD POSITIONING PINK XL (MISCELLANEOUS) ×2 IMPLANT
PORT LAP GEL ALEXIS MED 5-9CM (MISCELLANEOUS) ×2 IMPLANT
POSITIONER SURGICAL ARM (MISCELLANEOUS) IMPLANT
RELOAD PROXIMATE 75MM BLUE (ENDOMECHANICALS) ×4 IMPLANT
RTRCTR WOUND ALEXIS 18CM MED (MISCELLANEOUS)
SCISSORS LAP 5X35 DISP (ENDOMECHANICALS) ×2 IMPLANT
SEALER TISSUE X1 CVD JAW (INSTRUMENTS) IMPLANT
SLEEVE XCEL OPT CAN 5 100 (ENDOMECHANICALS) ×2 IMPLANT
STAPLER GUN LINEAR PROX 60 (STAPLE) ×2 IMPLANT
STAPLER PROXIMATE 75MM BLUE (STAPLE) ×2 IMPLANT
STAPLER VISISTAT 35W (STAPLE) ×2 IMPLANT
SUT ETHILON 3 0 PS 1 (SUTURE) IMPLANT
SUT NOVA NAB DX-16 0-1 5-0 T12 (SUTURE) ×4 IMPLANT
SUT PDS AB 1 CTX 36 (SUTURE) IMPLANT
SUT PDS AB 1 TP1 96 (SUTURE) IMPLANT
SUT PROLENE 2 0 SH DA (SUTURE) IMPLANT
SUT SILK 2 0 (SUTURE) ×1
SUT SILK 2 0 SH CR/8 (SUTURE) ×2 IMPLANT
SUT SILK 2-0 18XBRD TIE 12 (SUTURE) ×1 IMPLANT
SUT SILK 3 0 (SUTURE) ×1
SUT SILK 3 0 SH CR/8 (SUTURE) ×2 IMPLANT
SUT SILK 3-0 18XBRD TIE 12 (SUTURE) ×1 IMPLANT
SUT VIC AB 2-0 SH 18 (SUTURE) ×2 IMPLANT
SUT VIC AB 4-0 PS2 27 (SUTURE) ×2 IMPLANT
TOWEL OR NON WOVEN STRL DISP B (DISPOSABLE) ×2 IMPLANT
TRAY FOLEY W/METER SILVER 16FR (SET/KITS/TRAYS/PACK) ×2 IMPLANT
TROCAR BLADELESS OPT 5 100 (ENDOMECHANICALS) ×2 IMPLANT
TROCAR XCEL BLUNT TIP 100MML (ENDOMECHANICALS) IMPLANT
TROCAR XCEL NON-BLD 11X100MML (ENDOMECHANICALS) IMPLANT
TUBING CONNECTING 10 (TUBING) IMPLANT
TUBING INSUF HEATED (TUBING) ×2 IMPLANT

## 2017-06-30 NOTE — Addendum Note (Signed)
Addended by: Therisa Doyne on: 06/30/2017 04:16 PM   Modules accepted: Orders

## 2017-06-30 NOTE — Op Note (Signed)
06/26/2017 - 06/30/2017  9:15 AM  PATIENT:  [name]  71 y.o. male  Patient Care Team: [name], MD as PCP - General (Family Medicine) Pyrtle, Lajuan Lines, MD as Consulting Physician (Gastroenterology)  PRE-OPERATIVE DIAGNOSIS:  small bowel mass  POST-OPERATIVE DIAGNOSIS:  small bowel mass  PROCEDURE:   DIAGNOSTIC LAPAROSCOPY, SMALL BOWEL RESECTION    Surgeon(s): Leighton Ruff, MD  ASSISTANT: Obie Dredge, PA   ANESTHESIA:   local and general  EBL:  Total I/O In: 1250 [I.V.:1000; IV Piggyback:250] Out: 120 [Urine:100; Blood:20]  DRAINS: none   SPECIMEN:  Source of Specimen:  small bowel, jejunum  DISPOSITION OF SPECIMEN:  PATHOLOGY  COUNTS:  YES  PLAN OF CARE: Pt already admitted  PATIENT DISPOSITION:  PACU - hemodynamically stable.  INDICATION: 71 year old male with chronic GI bleeding who presented to the hospital with another episode of anemia.  CT enterography showed a mass in the small bowel.  He is consented to undergo resection of this.   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  General anesthesia was induced without difficulty. SCDs were also noted to be in place prior to the initiation of anesthesia.  The patient was then prepped and draped in the usual sterile fashion.   A surgical timeout was performed indicating the correct patient, procedure, positioning and need for preoperative antibiotics.   I began by making a periumbilical incision using a 10 blade scalpel.  This was carried down through the subcutaneous tissue using a cautery.  The fascia was divided using cautery as well.  The peritoneum was entered bluntly.  An [place] wound protector was placed.  The Was placed on the wound protector and the abdomen was insufflated to approximately 15 mmHg.  I  placed 2 5 mm ports in the left upper and lower quadrant.  I then identified the terminal ileum and inspected the entire small bowel to the ligament of Treitz.  There was an area of intussusception in the distal jejunum and an obvious mass.  In the proximal jejunum there was also an area of tattoo noted.  There was no other sign of metastatic disease.  All 4 quadrants of the abdomen appeared normal.  I then remove the And desufflated the abdomen.  The mass within the small bowel was brought out through the wound.  I also ran the small bowel from the area of the mass to the tattooed area.  There were no other masses palpated.  The tattooed area was flat and no mass could be palpated.  This was placed back into the abdomen.  I resected a portion of the small bowel with approximately 10 cm proximal and distal margin.  I dissected the mesentery down to a nodule that could be palpated.  This was done using a LigaSure device.  Once below the nodule the remaining mesentery was divided and the specimen was sent to pathology for further examination.  The 2 ends of the small bowel were anastomosed with a 75 mm GIA stapler.  The common enterotomy was closed with a 60 mm TA stapler.  The mesenteric defect was closed using interrupted 2-0 silk sutures.  An anti-tension suture was placed in the crotch of the anastomosis as well.  This was then placed back into the abdomen.  The abdomen was irrigated with normal  saline.  Hemostasis was excellent.  The omentum was brought down over the small bowel and the ports were removed.  I switched to clean gloves and then began to close the fascia using #1 Novafil interrupted sutures.  I reapproximated the subcutaneous tissue using interrupted 2-0 Vicryl sutures.  The skin was closed with a running 4-0 Vicryl suture.  The ports were closed with 4-0 Vicryl suture and Dermabond.  A dressing was placed over the midline incision.  The patient was then awakened from anesthesia and sent to the  post anesthesia care unit in stable condition.  All counts were correct per operating room staff.

## 2017-06-30 NOTE — Anesthesia Procedure Notes (Signed)
Procedure Name: Intubation Date/Time: 06/30/2017 7:39 AM Performed by: [place] Kemari Narez C, CRNA Pre-anesthesia Checklist: Patient identified, Emergency Drugs available, Suction available and Patient being monitored Patient Re-evaluated:Patient Re-evaluated prior to induction Oxygen Delivery Method: Circle system utilized Preoxygenation: Pre-oxygenation with 100% oxygen Induction Type: IV induction Ventilation: Mask ventilation without difficulty Laryngoscope Size: Mac and 4 Grade View: Grade I Tube type: Oral Tube size: 7.5 mm Number of attempts: 1 Airway Equipment and Method: Stylet and Oral airway Placement Confirmation: ETT inserted through vocal cords under direct vision,  positive ETCO2 and breath sounds checked- equal and bilateral Tube secured with: Tape Dental Injury: Teeth and Oropharynx as per pre-operative assessment

## 2017-06-30 NOTE — Anesthesia Preprocedure Evaluation (Addendum)
Anesthesia Evaluation  Patient identified by MRN, date of birth, ID band Patient awake    Reviewed: Allergy & Precautions, NPO status , Patient's Chart, lab work & pertinent test results  Airway Mallampati: I  TM Distance: >3 FB Neck ROM: Full    Dental  (+) Poor Dentition, Missing, Edentulous Upper, Dental Advisory Given,    Pulmonary former smoker,    breath sounds clear to auscultation       Cardiovascular hypertension, Pt. on medications and Pt. on home beta blockers + CAD, + Peripheral Vascular Disease, +CHF and + DOE   Rhythm:Regular Rate:Normal     Neuro/Psych negative neurological ROS     GI/Hepatic Neg liver ROS, PUD, GERD  ,  Endo/Other  diabetes, Type 2, Oral Hypoglycemic Agents  Renal/GU negative Renal ROS     Musculoskeletal negative musculoskeletal ROS (+)   Abdominal   Peds  Hematology  (+) anemia ,   Anesthesia Other Findings   Reproductive/Obstetrics negative OB ROS                            Lab Results  Component Value Date   WBC 6.8 06/30/2017   HGB 8.8 (L) 06/30/2017   HCT 28.3 (L) 06/30/2017   MCV 82.3 06/30/2017   PLT 227 06/30/2017   Lab Results  Component Value Date   CREATININE 1.25 (H) 06/30/2017   BUN 11 06/30/2017   NA 138 06/30/2017   K 4.0 06/30/2017   CL 107 06/30/2017   CO2 25 06/30/2017   Echo: - Left ventricle: The cavity size was normal. Wall thickness was   increased in a pattern of moderate LVH. Systolic function was   normal. The estimated ejection fraction was in the range of 60%   to 65%. Wall motion was normal; there were no regional wall   motion abnormalities. Doppler parameters are consistent with   abnormal left ventricular relaxation (grade 1 diastolic   dysfunction). - Left atrium: The atrium was mildly dilated. - Atrial septum: Mobile atrial septum no obvoius PFO consider f/u   bubble study if clinically  indicated.  Anesthesia Physical Anesthesia Plan  ASA: III  Anesthesia Plan: General   Post-op Pain Management:    Induction: Intravenous  PONV Risk Score and Plan: 3 and Ondansetron, Dexamethasone and Midazolam  Airway Management Planned: Oral ETT  Additional Equipment:   Intra-op Plan:   Post-operative Plan: Extubation in OR  Informed Consent: I have reviewed the patients History and Physical, chart, labs and discussed the procedure including the risks, benefits and alternatives for the proposed anesthesia with the patient or authorized representative who has indicated his/her understanding and acceptance.   Dental advisory given  Plan Discussed with: CRNA  Anesthesia Plan Comments:         Anesthesia Quick Evaluation

## 2017-06-30 NOTE — Progress Notes (Signed)
Patient was back in unit after surgery at 1200; alert and oriented x2. VS was taken and continue to monitor.

## 2017-06-30 NOTE — Progress Notes (Signed)
PROGRESS NOTE    [name]  TAV:[id number] DOB: 10-31-1945 DOA: 06/26/2017 PCP: [name], MD   Brief Narrative: 71 year old male with history of chronic iron deficiency anemia, occult small bowel bleeding concern for possible small bowel polyp versus mass in 2017, coronary artery disease with history of PCI on Plavix, peripheral vascular disease, chronic kidney disease, hypertension, hyperlipidemia, type 2 diabetes presented with shortness of breath, dyspnea on exertion in the setting of symptomatic acute on chronic anemia.  In ER patient was found to have hemoglobin of 6 received a unit of red blood cells with hemoglobin improved to 7.  S/p small bowel resection on 11/13.  Assessment & Plan:   #Acute on chronic anemia due to occult blood loss from his small bowel: Small bowel  mass lesion. -CT scan with a small bowel lesion concerning for neoplasm,like the site of bleeding.   -s/p lap small bowel resection by general surgery today. Currently, pt has NG tube with suction and NPO. Continue IVF, add protonix IV.  -f/u surgery team for further plan.     #Chronic iron deficiency anemia: Received IV iron and 2 units of red blood cell transfusion.  Monitor CBC.  #Hypertension: Blood pressure acceptable.  Oral meds on hold. Ordered metoprolol IV.  #History of coronary artery disease: Patient does not have chest pain.  I will continue bisoprolol,  Imdur and Crestor.  Aspirin and Plavix currently on hold. Resume when ok from surgery team.  #Type 2 diabetes: Continue to monitor blood sugar level.  Continue sliding scale.  A1c 8.6.  DVT prophylaxis:SCD Code Status: Full code Family Communication: d/w pt's daughter at bedside Disposition Plan: Currently admitted    Consultants:   GI  surgery  Procedures: small bowel surgery Antimicrobials: None  Subjective: Seen and examined at bedside. Alert awake but drowsy, effect of anesthesia still persist. Denied pain, N/V. His  daughter at bedside.   Objective: Vitals:   06/30/17 1100 06/30/17 1114 06/30/17 1115 06/30/17 1128  BP: (!) 162/84  (!) 167/85 (!) 163/86  Pulse: 85  85 90  Resp: 16  15 16   Temp:  97.9 F (36.6 C) 97.9 F (36.6 C) 98.3 F (36.8 C)  TempSrc:      SpO2: 99%  99% 98%  Weight:      Height:        Intake/Output Summary (Last 24 hours) at 06/30/2017 1211 Last data filed at 06/30/2017 1136 Gross per 24 hour  Intake 3132.83 ml  Output 720 ml  Net 2412.83 ml   Filed Weights   06/26/17 2154  Weight: 89.8 kg (198 lb)    Examination:  General exam: NAD, has NG tube with suction Respiratory system: Clear b/l, no wheeze or crackle Cardiovascular system: RRR s1s2 nl, no murmur. Gastrointestinal system: Abd soft, NT, ND. BS sluggish Central nervous system: Alert awake  Skin: No rashes, lesions or ulcers Psychiatry: unable to assess today because of effect of anesthesia.     Data Reviewed: I have personally reviewed following labs and imaging studies  CBC: Recent Labs  Lab 06/26/17 1812 06/27/17 0632 06/28/17 0635 06/29/17 0522 06/30/17 0529  WBC 7.5 6.1 6.6 7.1 6.8  HGB 6.0* 7.0* 8.2* 8.5* 8.8*  HCT 19.5* 22.4* 25.9* 27.1* 28.3*  MCV 75.6* 77.8* 80.4 81.1 82.3  PLT 265 223 215 215 553   Basic Metabolic Panel: Recent Labs  Lab 06/26/17 1812 06/30/17 0529  NA 135 138  K 4.0 4.0  CL 100* 107  CO2 25 25  GLUCOSE 303* 161*  BUN 20 11  CREATININE 1.35* 1.25*  CALCIUM 9.2 9.1   GFR: Estimated Creatinine Clearance: 58.2 mL/min (A) (by C-G formula based on SCr of 1.25 mg/dL (H)). Liver Function Tests: No results for input(s): AST, ALT, ALKPHOS, BILITOT, PROT, ALBUMIN in the last 168 hours. No results for input(s): LIPASE, AMYLASE in the last 168 hours. No results for input(s): AMMONIA in the last 168 hours. Coagulation Profile: No results for input(s): INR, PROTIME in the last 168 hours. Cardiac Enzymes: No results for input(s): CKTOTAL, CKMB, CKMBINDEX,  TROPONINI in the last 168 hours. BNP (last 3 results) No results for input(s): PROBNP in the last 8760 hours. HbA1C: No results for input(s): HGBA1C in the last 72 hours. CBG: Recent Labs  Lab 06/29/17 1157 06/29/17 1657 06/29/17 2016 06/30/17 1016 06/30/17 1207  GLUCAP 200* 107* 277* 238* 250*   Lipid Profile: No results for input(s): CHOL, HDL, LDLCALC, TRIG, CHOLHDL, LDLDIRECT in the last 72 hours. Thyroid Function Tests: No results for input(s): TSH, T4TOTAL, FREET4, T3FREE, THYROIDAB in the last 72 hours. Anemia Panel: No results for input(s): VITAMINB12, FOLATE, FERRITIN, TIBC, IRON, RETICCTPCT in the last 72 hours. Sepsis Labs: No results for input(s): PROCALCITON, LATICACIDVEN in the last 168 hours.  Recent Results (from the past 240 hour(s))  Surgical pcr screen     Status: None   Collection Time: 06/29/17 12:29 PM  Result Value Ref Range Status   MRSA, PCR NEGATIVE NEGATIVE Final   Staphylococcus aureus NEGATIVE NEGATIVE Final    Comment: (NOTE) The Xpert SA Assay (FDA approved for NASAL specimens in patients 78 years of age and older), is one component of a comprehensive surveillance program. It is not intended to diagnose infection nor to guide or monitor treatment.          Radiology Studies: No results found.      Scheduled Meds: . amLODipine  5 mg Oral Daily  . bisoprolol  5 mg Oral Daily  . HYDROmorphone      . insulin aspart      . insulin aspart  0-15 Units Subcutaneous Q6H  . insulin aspart  0-5 Units Subcutaneous QHS  . isosorbide mononitrate  30 mg Oral Daily  . pantoprazole (PROTONIX) IV  40 mg Intravenous Q24H  . rosuvastatin  20 mg Oral Daily   Continuous Infusions: . 0.9 % NaCl with KCl 20 mEq / L 75 mL/hr at 06/30/17 1201  . acetaminophen    . ferumoxytol Stopped (06/27/17 1356)     LOS: 4 days    [name], MD [facility] Pager [phone]  If 7PM-7AM, please contact  night-coverage [email or web address] Password [email or web address] 06/30/2017, 12:11 PM

## 2017-06-30 NOTE — Progress Notes (Signed)
Inpatient Diabetes Program Recommendations  AACE/ADA: New Consensus Statement on Inpatient Glycemic Control (2015)  Target Ranges:  Prepandial:   less than 140 mg/dL      Peak postprandial:   less than 180 mg/dL (1-2 hours)      Critically ill patients:  140 - 180 mg/dL   Results for [name], [name] (MRN [id number]) as of 06/30/2017 13:54  Ref. Range 06/29/2017 07:45 06/29/2017 11:57 06/29/2017 16:57 06/29/2017 20:16  Glucose-Capillary Latest Ref Range: 65 - 99 mg/dL 174 (H) 200 (H) 107 (H) 277 (H)   Results for [name], [name] (MRN [id number]) as of 06/30/2017 13:54  Ref. Range 06/30/2017 10:16 06/30/2017 12:07  Glucose-Capillary Latest Ref Range: 65 - 99 mg/dL 238 (H) 250 (H)    Home DM Meds: Amaryl 4 mg daily                             Metformin 500 mg daily  Current Insulin Orders: Novolog Moderate Correction Scale/ SSI (0-15 units) TID AC + HS       MD- Please consider the following in-hospital insulin adjustments while home oral DM meds are on hold:  Start low dose basal insulin:  Recommend Lantus 9 units daily (0.1 units/kg dosing)     --Will follow patient during hospitalization--  Wyn Quaker RN, MSN, CDE Diabetes Coordinator Inpatient Glycemic Control Team Team Pager: [phone] (8a-5p)

## 2017-06-30 NOTE — Anesthesia Postprocedure Evaluation (Signed)
Anesthesia Post Note  Patient: [name]  Procedure(s) Performed: LAPAROSCOPIC SMALL BOWEL RESECTION (N/A )     Patient location during evaluation: PACU Anesthesia Type: General Level of consciousness: awake and alert Pain management: pain level controlled Vital Signs Assessment: post-procedure vital signs reviewed and stable Respiratory status: spontaneous breathing, nonlabored ventilation, respiratory function stable and patient connected to nasal cannula oxygen Cardiovascular status: blood pressure returned to baseline and stable Postop Assessment: no apparent nausea or vomiting Anesthetic complications: no    Last Vitals:  Vitals:   06/30/17 1115 06/30/17 1128  BP: (!) 167/85 (!) 163/86  Pulse: 85 90  Resp: 15 16  Temp: 36.6 C 36.8 C  SpO2: 99% 98%    Last Pain:  Vitals:   06/30/17 1310  TempSrc:   PainSc: Asleep                 Effie Berkshire

## 2017-06-30 NOTE — Transfer of Care (Signed)
Immediate Anesthesia Transfer of Care Note  Patient: [name]  Procedure(s) Performed: LAPAROSCOPIC SMALL BOWEL RESECTION (N/A )  Patient Location: PACU  Anesthesia Type:General  Level of Consciousness: awake and alert   Airway & Oxygen Therapy: Patient Spontanous Breathing and Patient connected to face mask oxygen  Post-op Assessment: Report given to RN and Post -op Vital signs reviewed and stable  Post vital signs: Reviewed and stable  Last Vitals:  Vitals:   06/29/17 2013 06/30/17 0510  BP: (!) 157/90 (!) 144/89  Pulse: 76 63  Resp: 18 18  Temp: 36.8 C 36.8 C  SpO2: 100% 99%    Last Pain:  Vitals:   06/30/17 0510  TempSrc: Oral  PainSc:          Complications: No apparent anesthesia complications

## 2017-06-30 NOTE — Plan of Care (Signed)
Patient is alert and oriented; rest in bed; continue monitor vital signs as policy.

## 2017-06-30 NOTE — Progress Notes (Signed)
GI bleeding  Subjective: Pt with no complaints  Objective: Vital signs in last 24 hours: Temp:  [97.9 F (36.6 C)-98.3 F (36.8 C)] 98.2 F (36.8 C) (11/13 0510) Pulse Rate:  [59-76] 63 (11/13 0510) Resp:  [18] 18 (11/13 0510) BP: (133-157)/(81-90) 144/89 (11/13 0510) SpO2:  [99 %-100 %] 99 % (11/13 0510) Last BM Date: 06/29/17  Intake/Output from previous day: 11/12 0701 - 11/13 0700 In: 1597.8 [P.O.:480; I.V.:1000.8; IV Piggyback:117] Out: 850 [Urine:850] Intake/Output this shift: No intake/output data recorded.  General appearance: alert and cooperative GI: soft, non-distended  Lab Results:  Results for orders placed or performed during the hospital encounter of 06/26/17 (from the past 24 hour(s))  Glucose, capillary     Status: Abnormal   Collection Time: 06/29/17  7:45 AM  Result Value Ref Range   Glucose-Capillary 174 (H) 65 - 99 mg/dL  Glucose, capillary     Status: Abnormal   Collection Time: 06/29/17 11:57 AM  Result Value Ref Range   Glucose-Capillary 200 (H) 65 - 99 mg/dL  Surgical pcr screen     Status: None   Collection Time: 06/29/17 12:29 PM  Result Value Ref Range   MRSA, PCR NEGATIVE NEGATIVE   Staphylococcus aureus NEGATIVE NEGATIVE  Glucose, capillary     Status: Abnormal   Collection Time: 06/29/17  4:57 PM  Result Value Ref Range   Glucose-Capillary 107 (H) 65 - 99 mg/dL  Glucose, capillary     Status: Abnormal   Collection Time: 06/29/17  8:16 PM  Result Value Ref Range   Glucose-Capillary 277 (H) 65 - 99 mg/dL  CBC     Status: Abnormal   Collection Time: 06/30/17  5:29 AM  Result Value Ref Range   WBC 6.8 4.0 - 10.5 K/uL   RBC 3.44 (L) 4.22 - 5.81 MIL/uL   Hemoglobin 8.8 (L) 13.0 - 17.0 g/dL   HCT 28.3 (L) 39.0 - 52.0 %   MCV 82.3 78.0 - 100.0 fL   MCH 25.6 (L) 26.0 - 34.0 pg   MCHC 31.1 30.0 - 36.0 g/dL   RDW 22.6 (H) 11.5 - 15.5 %   Platelets 227 150 - 400 K/uL  Basic metabolic panel     Status: Abnormal   Collection Time:  06/30/17  5:29 AM  Result Value Ref Range   Sodium 138 135 - 145 mmol/L   Potassium 4.0 3.5 - 5.1 mmol/L   Chloride 107 101 - 111 mmol/L   CO2 25 22 - 32 mmol/L   Glucose, Bld 161 (H) 65 - 99 mg/dL   BUN 11 6 - 20 mg/dL   Creatinine, Ser 1.25 (H) 0.61 - 1.24 mg/dL   Calcium 9.1 8.9 - 10.3 mg/dL   GFR calc non Af Amer 56 (L) >60 mL/min   GFR calc Af Amer >60 >60 mL/min   Anion gap 6 5 - 15     Studies/Results Radiology     MEDS, Scheduled . [MAR Hold] amLODipine  5 mg Oral Daily  . [MAR Hold] bisoprolol  5 mg Oral Daily  . bupivacaine liposome  20 mL Infiltration Once  . [MAR Hold] insulin aspart  0-15 Units Subcutaneous TID WC  . [MAR Hold] insulin aspart  0-5 Units Subcutaneous QHS  . [MAR Hold] isosorbide mononitrate  30 mg Oral Daily  . [MAR Hold] pantoprazole  40 mg Oral Q0600  . [MAR Hold] rosuvastatin  20 mg Oral Daily     Assessment: GI bleeding: CT enterography shows a possible mass  in his distal small bowel.  Plan: I have reviewed the CT.  Mass appears resectable.  Will plan on OR today, lap small bowel resection.  Risks include bleeding, infection, missed pathology, hernia and need for additional surgery.  I believe he understands this and agrees to proceed.       LOS: 4 days    Rosario Adie, MD [facility], [place]   06/30/2017 7:22 AM

## 2017-07-01 ENCOUNTER — Telehealth (HOSPITAL_COMMUNITY): Payer: Self-pay

## 2017-07-01 DIAGNOSIS — Z978 Presence of other specified devices: Secondary | ICD-10-CM

## 2017-07-01 DIAGNOSIS — E119 Type 2 diabetes mellitus without complications: Secondary | ICD-10-CM

## 2017-07-01 DIAGNOSIS — I2510 Atherosclerotic heart disease of native coronary artery without angina pectoris: Secondary | ICD-10-CM

## 2017-07-01 DIAGNOSIS — Z9861 Coronary angioplasty status: Secondary | ICD-10-CM

## 2017-07-01 DIAGNOSIS — K6389 Other specified diseases of intestine: Secondary | ICD-10-CM

## 2017-07-01 LAB — CBC
HCT: 30.2 % — ABNORMAL LOW (ref 39.0–52.0)
Hemoglobin: 9.5 g/dL — ABNORMAL LOW (ref 13.0–17.0)
MCH: 25.9 pg — ABNORMAL LOW (ref 26.0–34.0)
MCHC: 31.5 g/dL (ref 30.0–36.0)
MCV: 82.3 fL (ref 78.0–100.0)
Platelets: 251 10*3/uL (ref 150–400)
RBC: 3.67 MIL/uL — ABNORMAL LOW (ref 4.22–5.81)
RDW: 24.1 % — ABNORMAL HIGH (ref 11.5–15.5)
WBC: 15.4 10*3/uL — ABNORMAL HIGH (ref 4.0–10.5)

## 2017-07-01 LAB — GLUCOSE, CAPILLARY
Glucose-Capillary: 224 mg/dL — ABNORMAL HIGH (ref 65–99)
Glucose-Capillary: 191 mg/dL — ABNORMAL HIGH (ref 65–99)
Glucose-Capillary: 149 mg/dL — ABNORMAL HIGH (ref 65–99)
Glucose-Capillary: 189 mg/dL — ABNORMAL HIGH (ref 65–99)
Glucose-Capillary: 222 mg/dL — ABNORMAL HIGH (ref 65–99)

## 2017-07-01 LAB — BASIC METABOLIC PANEL
Anion gap: 7 (ref 5–15)
BUN: 13 mg/dL (ref 6–20)
CO2: 26 mmol/L (ref 22–32)
Calcium: 9 mg/dL (ref 8.9–10.3)
Chloride: 102 mmol/L (ref 101–111)
Creatinine, Ser: 1.15 mg/dL (ref 0.61–1.24)
GFR calc Af Amer: >60 mL/min (ref >60)
GFR calc non Af Amer: >60 mL/min (ref >60)
Glucose, Bld: 203 mg/dL — ABNORMAL HIGH (ref 65–99)
Potassium: 4.2 mmol/L (ref 3.5–5.1)
Sodium: 135 mmol/L (ref 135–145)

## 2017-07-01 MED ORDER — PHENOL 1.4 % MT LIQD
2.0000 | OROMUCOSAL | Status: DC | PRN
  Filled 2017-07-01: qty 177

## 2017-07-01 NOTE — Telephone Encounter (Signed)
Encounter complete. 

## 2017-07-01 NOTE — Progress Notes (Signed)
PROGRESS NOTE    [name]  ELF:[id number] DOB: November 07, 1945 DOA: 06/26/2017 PCP: [name], MD   Brief Narrative: 71 year old male with history of chronic iron deficiency anemia, occult small bowel bleeding concern for possible small bowel polyp versus mass in 2017, coronary artery disease with history of PCI on Plavix, peripheral vascular disease, chronic kidney disease, hypertension, hyperlipidemia, type 2 diabetes presented with shortness of breath, dyspnea on exertion in the setting of symptomatic acute on chronic anemia.  He was sent from cardiology office to ER due to anemia /sob. In ER patient was found to have hemoglobin of 6 received a unit of red blood cells with hemoglobin improved to 7.  S/p small bowel resection on 11/13.  Assessment & Plan:   #Acute on chronic anemia due to occult blood loss from his small bowel: Small bowel  mass lesion. -h/o gi bleed with extensive gi work up, last at [facility] -CT scan with a small bowel lesion concerning for neoplasm,like the site of bleeding.   -LBGI consulted initially who recommended ex-lap -s/p lap small bowel resection by general surgery on 11/13.  -Currently, pt has NG tube with suction and NPO. Continue IVF, add protonix IV.  -diet advancement per general surgery, will f/u surgery team for further plan.  -pain control, awaiting bowel function, surgical pathology result -Hold plavix, resume when ok with general surgery.    #Chronic iron deficiency anemia: Received IV iron and 2 units of red blood cell transfusion.  Monitor CBC.  #Hypertension: Blood pressure acceptable.  He is currently npo except meds per general surgery order, continue titrate bp meds  #History of coronary artery disease:  -followed by Dr Oval Linsey  -Patient does not have chest pain.  on bisoprolol,  Imdur and Crestor.   -Aspirin and Plavix currently on hold. Resume when ok from surgery team.  #noninsulin dependent Type 2 diabetes: Continue to monitor  blood sugar level.  Continue sliding scale.  A1c 8.6. Home oral meds metformin and amaryl held since admission  DVT prophylaxis:SCD Code Status: Full code Family Communication: patient Disposition Plan: Currently admitted    Consultants:   LBGI  Surgery  Procedures: small bowel surgery DIAGNOSTIC LAPAROSCOPY, SMALL BOWEL RESECTION on 11/13 by general surgery Dr Leighton Ruff    Antimicrobials: None  Subjective: Sitting up in chair with ng on suction, he reports abdominal pain, no flatus, no bm He reports not able to sleep last night, he report continued abdominal pain this am, no flatus, no bm yet, no fever  RN reports patient slept well after one does of iv morphine last night    Objective: Vitals:   06/30/17 1128 06/30/17 1340 06/30/17 2003 07/01/17 0515  BP: (!) 163/86 (!) 151/83 (!) 142/85 (!) 161/78  Pulse: 90 88 79 84  Resp: 16 16 16 17   Temp: 98.3 F (36.8 C) 98.4 F (36.9 C) 98 F (36.7 C) 98.1 F (36.7 C)  TempSrc:  Axillary Axillary Oral  SpO2: 98% 99% 100% 100%  Weight:      Height:        Intake/Output Summary (Last 24 hours) at 07/01/2017 0840 Last data filed at 07/01/2017 0552 Gross per 24 hour  Intake 3263.75 ml  Output 2720 ml  Net 543.75 ml   Filed Weights   06/26/17 2154  Weight: 89.8 kg (198 lb)    Examination:  General exam: NAD, has NG tube with suction Respiratory system: Clear b/l, no wheeze or crackle Cardiovascular system: RRR s1s2 nl, no murmur. Gastrointestinal  system: Abd soft, ND. Surgical incision site C/D/I, honeycomb dressing in place, mild tender with surgical site, BS sluggish Central nervous system: Alert awake  Skin: No rashes, lesions or ulcers Psychiatry: calm and cooperative     Data Reviewed: I have personally reviewed following labs and imaging studies  CBC: Recent Labs  Lab 06/27/17 0632 06/28/17 0635 06/29/17 0522 06/30/17 0529 07/01/17 0635  WBC 6.1 6.6 7.1 6.8 15.4*  HGB 7.0* 8.2* 8.5* 8.8*  9.5*  HCT 22.4* 25.9* 27.1* 28.3* 30.2*  MCV 77.8* 80.4 81.1 82.3 82.3  PLT 223 215 215 227 983   Basic Metabolic Panel: Recent Labs  Lab 06/26/17 1812 06/30/17 0529 07/01/17 0635  NA 135 138 135  K 4.0 4.0 4.2  CL 100* 107 102  CO2 25 25 26   GLUCOSE 303* 161* 203*  BUN 20 11 13   CREATININE 1.35* 1.25* 1.15  CALCIUM 9.2 9.1 9.0   GFR: Estimated Creatinine Clearance: 63.2 mL/min (by C-G formula based on SCr of 1.15 mg/dL). Liver Function Tests: No results for input(s): AST, ALT, ALKPHOS, BILITOT, PROT, ALBUMIN in the last 168 hours. No results for input(s): LIPASE, AMYLASE in the last 168 hours. No results for input(s): AMMONIA in the last 168 hours. Coagulation Profile: No results for input(s): INR, PROTIME in the last 168 hours. Cardiac Enzymes: No results for input(s): CKTOTAL, CKMB, CKMBINDEX, TROPONINI in the last 168 hours. BNP (last 3 results) No results for input(s): PROBNP in the last 8760 hours. HbA1C: No results for input(s): HGBA1C in the last 72 hours. CBG: Recent Labs  Lab 06/30/17 1207 06/30/17 1653 06/30/17 2159 07/01/17 0553 07/01/17 0723  GLUCAP 250* 209* 221* 224* 191*   Lipid Profile: No results for input(s): CHOL, HDL, LDLCALC, TRIG, CHOLHDL, LDLDIRECT in the last 72 hours. Thyroid Function Tests: No results for input(s): TSH, T4TOTAL, FREET4, T3FREE, THYROIDAB in the last 72 hours. Anemia Panel: No results for input(s): VITAMINB12, FOLATE, FERRITIN, TIBC, IRON, RETICCTPCT in the last 72 hours. Sepsis Labs: No results for input(s): PROCALCITON, LATICACIDVEN in the last 168 hours.  Recent Results (from the past 240 hour(s))  Surgical pcr screen     Status: None   Collection Time: 06/29/17 12:29 PM  Result Value Ref Range Status   MRSA, PCR NEGATIVE NEGATIVE Final   Staphylococcus aureus NEGATIVE NEGATIVE Final    Comment: (NOTE) The Xpert SA Assay (FDA approved for NASAL specimens in patients 95 years of age and older), is one component  of a comprehensive surveillance program. It is not intended to diagnose infection nor to guide or monitor treatment.          Radiology Studies: Dg Abd Portable 1v  Result Date: 06/30/2017 CLINICAL DATA:  Nasogastric tube placement EXAM: PORTABLE ABDOMEN - 1 VIEW COMPARISON:  CT scan of the abdomen of June 27, 2017 FINDINGS: The esophagogastric tube tip and proximal port lie in the gastric cardia. The bowel gas pattern is within the limits of normal today. There is patchy density at the right lung base laterally which likely reflects atelectasis and appears new. IMPRESSION: The esophagogastric tube tip in proximal port lie in the gastric cardia. Probable subsegmental atelectasis at the right lung base which is new. Electronically Signed   By: [name]  Martinique M.D.   On: 06/30/2017 12:41        Scheduled Meds: . amLODipine  5 mg Oral Daily  . bisoprolol  5 mg Oral Daily  . insulin aspart  0-15 Units Subcutaneous Q6H  . insulin  aspart  0-5 Units Subcutaneous QHS  . isosorbide mononitrate  30 mg Oral Daily  . pantoprazole (PROTONIX) IV  40 mg Intravenous Q24H  . rosuvastatin  20 mg Oral Daily   Continuous Infusions: . 0.9 % NaCl with KCl 20 mEq / L 75 mL/hr at 07/01/17 0103  . ferumoxytol Stopped (06/27/17 1356)     LOS: 5 days    Rollyn Scialdone, MD PhD [facility] Pager [phone]  If 7PM-7AM, please contact night-coverage [email or web address] Password [email or web address] 07/01/2017, 8:40 AM

## 2017-07-01 NOTE — Care Management Note (Signed)
Case Management Note  Patient Details  Name: [name] MRN: [id number] Date of Birth: 1946-01-07  Subjective/Objective:                  Small bowel resection  Action/Plan:  Date: July 01, 2017 Velva Harman, BSN, [place], [place] Chart and notes review for patient progress and needs. Will follow for case management and discharge needs. Next review date: [id number]  Expected Discharge Date:                  Expected Discharge Plan:  Home/Self Care  In-House Referral:     Discharge planning Services  CM Consult  Post Acute Care Choice:    Choice offered to:     DME Arranged:    DME Agency:     HH Arranged:    HH Agency:     Status of Service:  In process, will continue to follow  If discussed at Long Length of Stay Meetings, dates discussed:    Additional Comments:  Leeroy Cha, RN 07/01/2017, 8:49 AM

## 2017-07-01 NOTE — Progress Notes (Signed)
[facility] Progress Note  1 Day Post-Op  Subjective: CC:  Patient requesting to see the mass that was in his small bowel. Reports mild abdominal soreness. Denies fever, chills, nausea, vomiting. +flatus. Denies BM. Sitting up in chair. Pulling 1750 on IS. Objective: Vital signs in last 24 hours: Temp:  [97.9 F (36.6 C)-98.4 F (36.9 C)] 98.1 F (36.7 C) (11/14 0515) Pulse Rate:  [79-90] 84 (11/14 0515) Resp:  [13-18] 17 (11/14 0515) BP: (142-168)/(78-86) 161/78 (11/14 0515) SpO2:  [97 %-100 %] 100 % (11/14 0515) Last BM Date: 06/29/17  Intake/Output from previous day: 11/13 0701 - 11/14 0700 In: 3513.8 [P.O.:125; I.V.:2738.8; IV Piggyback:650] Out: 2720 [Urine:2600; Emesis/NG output:100; Blood:20] Intake/Output this shift: No intake/output data recorded.  PE: Gen:  Alert, NAD, pleasant and cooperative Pulm:  Normal effort Abd: Soft, appropriately tender, non-distended, bowel sounds present but hypoactive, incision C/D/I w/ honeycomb in place and small amount dried sanguinous drainage. Skin: warm and dry, no rashes  Psych: A&Ox3   Lab Results:  Recent Labs    06/30/17 0529 07/01/17 0635  WBC 6.8 15.4*  HGB 8.8* 9.5*  HCT 28.3* 30.2*  PLT 227 251   BMET Recent Labs    06/30/17 0529  NA 138  K 4.0  CL 107  CO2 25  GLUCOSE 161*  BUN 11  CREATININE 1.25*  CALCIUM 9.1   CMP     Component Value Date/Time   NA 138 06/30/2017 0529   NA 137 06/22/2017 0946   K 4.0 06/30/2017 0529   CL 107 06/30/2017 0529   CO2 25 06/30/2017 0529   GLUCOSE 161 (H) 06/30/2017 0529   BUN 11 06/30/2017 0529   BUN 19 06/22/2017 0946   CREATININE 1.25 (H) 06/30/2017 0529   CREATININE 1.35 (H) 10/31/2015 1559   CALCIUM 9.1 06/30/2017 0529   PROT 6.9 06/22/2017 0946   ALBUMIN 4.0 06/22/2017 0946   AST 10 06/22/2017 0946   ALT 7 06/22/2017 0946   ALKPHOS 43 06/22/2017 0946   BILITOT 0.3 06/22/2017 0946   GFRNONAA 56 (L) 06/30/2017 0529   GFRAA >60 06/30/2017  0529   Lipase     Component Value Date/Time   LIPASE 45 02/27/2016 2014   Studies/Results: Dg Abd Portable 1v  Result Date: 06/30/2017 CLINICAL DATA:  Nasogastric tube placement EXAM: PORTABLE ABDOMEN - 1 VIEW COMPARISON:  CT scan of the abdomen of June 27, 2017 FINDINGS: The esophagogastric tube tip and proximal port lie in the gastric cardia. The bowel gas pattern is within the limits of normal today. There is patchy density at the right lung base laterally which likely reflects atelectasis and appears new. IMPRESSION: The esophagogastric tube tip in proximal port lie in the gastric cardia. Probable subsegmental atelectasis at the right lung base which is new. Electronically Signed   By: [name]  Martinique M.D.   On: 06/30/2017 12:41   Anti-infectives: Anti-infectives (From admission, onward)   Start     Dose/Rate Route Frequency Ordered Stop   06/30/17 1345  ceFAZolin (ANCEF) IVPB 2g/100 mL premix     2 g 200 mL/hr over 30 Minutes Intravenous On call to O.R. 06/29/17 1052 06/30/17 0741   06/30/17 0654  ceFAZolin (ANCEF) 2-4 GM/100ML-% IVPB    Comments:  [place]  : cabinet override      06/30/17 0654 06/30/17 0741   06/29/17 1100  ceFAZolin (ANCEF) IVPB 2g/100 mL premix  Status:  Discontinued     2 g 200 mL/hr over 30 Minutes Intravenous  On call to O.R. 06/29/17 1044 06/29/17 1052     Assessment/Plan Chronic iron deficiency anemia HTN PMH CAD DM2  Small bowel mass S/P DIAGNOSTIC LAPAROSCOPY, SMALL BOWEL RESECTION 02/72/53, Dr. Leighton Ruff - POD#1 - follow surgical path - continue NG tube to LIWS and await further bowel function; may be able to DC NG tube tomorrow vs Friday. - mobilize and IS  FEN: NPO, IVF  ID: perioperative Ancef 11/13 VTE: SCD's, ok to resume chemical VTE proph from surgical perspective.  Foley: D/C today  POD#1     LOS: 5 days    Jill Alexanders , [facility] 07/01/2017, 7:48 AM Pager: [phone] Consults:  [phone] Mon-Fri 7:00 am-4:30 pm Sat-Sun 7:00 am-11:30 am

## 2017-07-01 NOTE — Care Management Important Message (Signed)
Important Message  Patient Details  Name: [name] MRN: [id number] Date of Birth: 08-Jan-1946   Medicare Important Message Given:  Yes    Kerin Salen 07/01/2017, 11:13 AMImportant Message  Patient Details  Name: [name] MRN: [id number] Date of Birth: [birth year]   Medicare Important Message Given:  Yes    Kerin Salen 07/01/2017, 11:13 AM

## 2017-07-02 ENCOUNTER — Encounter: Payer: Self-pay | Admitting: Cardiovascular Disease

## 2017-07-02 LAB — CBC
HCT: 31.9 % — ABNORMAL LOW (ref 39.0–52.0)
Hemoglobin: 10.1 g/dL — ABNORMAL LOW (ref 13.0–17.0)
MCH: 26.3 pg (ref 26.0–34.0)
MCHC: 31.7 g/dL (ref 30.0–36.0)
MCV: 83.1 fL (ref 78.0–100.0)
Platelets: 251 10*3/uL (ref 150–400)
RBC: 3.84 MIL/uL — ABNORMAL LOW (ref 4.22–5.81)
RDW: 24.6 % — ABNORMAL HIGH (ref 11.5–15.5)
WBC: 13.5 10*3/uL — ABNORMAL HIGH (ref 4.0–10.5)

## 2017-07-02 LAB — BASIC METABOLIC PANEL
Anion gap: 8 (ref 5–15)
BUN: 14 mg/dL (ref 6–20)
CO2: 27 mmol/L (ref 22–32)
Calcium: 9.2 mg/dL (ref 8.9–10.3)
Chloride: 102 mmol/L (ref 101–111)
Creatinine, Ser: 1 mg/dL (ref 0.61–1.24)
GFR calc Af Amer: >60 mL/min (ref >60)
GFR calc non Af Amer: >60 mL/min (ref >60)
Glucose, Bld: 175 mg/dL — ABNORMAL HIGH (ref 65–99)
Potassium: 3.7 mmol/L (ref 3.5–5.1)
Sodium: 137 mmol/L (ref 135–145)

## 2017-07-02 LAB — GLUCOSE, CAPILLARY
Glucose-Capillary: 235 mg/dL — ABNORMAL HIGH (ref 65–99)
Glucose-Capillary: 172 mg/dL — ABNORMAL HIGH (ref 65–99)
Glucose-Capillary: 165 mg/dL — ABNORMAL HIGH (ref 65–99)
Glucose-Capillary: 157 mg/dL — ABNORMAL HIGH (ref 65–99)
Glucose-Capillary: 141 mg/dL — ABNORMAL HIGH (ref 65–99)

## 2017-07-02 LAB — MAGNESIUM: Magnesium: 2 mg/dL (ref 1.7–2.4)

## 2017-07-02 MED ORDER — ENOXAPARIN SODIUM 40 MG/0.4ML ~~LOC~~ SOLN
40.0000 mg | Freq: Every day | SUBCUTANEOUS | Status: DC
  Administered 2017-07-02 – 2017-07-03 (×2): 40 mg via SUBCUTANEOUS
  Filled 2017-07-02 (×3): qty 0.4

## 2017-07-02 NOTE — Progress Notes (Signed)
PROGRESS NOTE    Other Atienza  HUD:[id number] DOB: 1945-11-15 DOA: 06/26/2017 PCP: [name], MD   Brief Narrative: 71 year old male with history of chronic iron deficiency anemia, occult small bowel bleeding concern for possible small bowel polyp versus mass in 2017, coronary artery disease with history of PCI on Plavix, peripheral vascular disease, chronic kidney disease, hypertension, hyperlipidemia, type 2 diabetes presented with shortness of breath, dyspnea on exertion in the setting of symptomatic acute on chronic anemia.  He was sent from cardiology office to ER due to anemia /sob. In ER patient was found to have hemoglobin of 6 received a unit of red blood cells with hemoglobin improved to 7.  S/p small bowel resection on 11/13.  Assessment & Plan:   #Acute on chronic anemia due to occult blood loss from his small bowel: Small bowel  mass lesion. -h/o gi bleed with extensive gi work up, last at [facility] -CT scan  On admission with a small bowel lesion concerning for neoplasm,like the site of bleeding.   -LBGI consulted initially who recommended ex-lap -s/p lap small bowel resection by general surgery on 11/13.  -Currently, pt has NG tube with suction and NPO. Continue IVF, continue protonix IV.  -diet advancement per general surgery, will f/u surgery team for further plan.  -pain control, awaiting bowel function and surgical pathology result -general surgery oked with pharmacological dvt prophyplaxis on 11/15 -asa/ plavix held since admission, resume when ok with general surgery.    #Chronic iron deficiency anemia: Received IV iron and 2 units of red blood cell transfusion.  Monitor CBC.  #Hypertension: Blood pressure acceptable.  He is currently npo except meds per general surgery order, continue titrate bp meds  #History of coronary artery disease:  -followed by Dr Oval Linsey  -Patient does not have chest pain.  on bisoprolol,  Imdur and Crestor.   -Aspirin and Plavix  currently on hold. Resume when ok from surgery team.  #noninsulin dependent Type 2 diabetes: Continue to monitor blood sugar level.  Continue sliding scale.  A1c 8.6. Home oral meds metformin and amaryl held since admission  DVT prophylaxis:SCD Code Status: Full code Family Communication: patient Disposition Plan: Currently admitted    Consultants:   [facility]BGI  Surgery  Procedures: small bowel surgery DIAGNOSTIC LAPAROSCOPY, SMALL BOWEL RESECTION on 11/13 by general surgery Dr Leighton Ruff    Antimicrobials: perioperative ancef  Subjective:   He did not require any prn alagesics since 1pm yesterday, he reports slept well and wants to go home Ng in place, documented 500 cc output since 7 am yesterday  + flatus, no bm no fever      Objective: Vitals:   07/01/17 0845 07/01/17 1430 07/01/17 2112 07/02/17 0537  BP: (!) 154/76 (!) 175/86 (!) 175/86 (!) 166/88  Pulse: 69 62 84 78  Resp: 18 18 16 20   Temp: 98.3 F (36.8 C) 98.6 F (37 C) 98.7 F (37.1 C) 99.2 F (37.3 C)  TempSrc: Oral Oral Oral Oral  SpO2: 100% 98% 99% 100%  Weight:      Height:        Intake/Output Summary (Last 24 hours) at 07/02/2017 1106 Last data filed at 07/02/2017 0730 Gross per 24 hour  Intake -  Output 1675 ml  Net -1675 ml   Filed Weights   06/26/17 2154  Weight: 89.8 kg (198 lb)    Examination:  General exam: NAD, has NG tube with suction Respiratory system: Clear b/l, no wheeze or crackle Cardiovascular system: RRR  s1s2 nl, no murmur. Gastrointestinal system: Abd soft, ND. Surgical incision site C/D/I, honeycomb dressing in place, mild tender with surgical site, BS more active today Central nervous system: Alert awake  Skin: No rashes, lesions or ulcers Psychiatry: calm and cooperative     Data Reviewed: I have personally reviewed following labs and imaging studies  CBC: Recent Labs  Lab 06/28/17 0635 06/29/17 0522 06/30/17 0529 07/01/17 0635 07/02/17 0530  WBC  6.6 7.1 6.8 15.4* 13.5*  HGB 8.2* 8.5* 8.8* 9.5* 10.1*  HCT 25.9* 27.1* 28.3* 30.2* 31.9*  MCV 80.4 81.1 82.3 82.3 83.1  PLT 215 215 227 251 696   Basic Metabolic Panel: Recent Labs  Lab 06/26/17 1812 06/30/17 0529 07/01/17 0635 07/02/17 0530  NA 135 138 135 137  K 4.0 4.0 4.2 3.7  CL 100* 107 102 102  CO2 25 25 26 27   GLUCOSE 303* 161* 203* 175*  BUN 20 11 13 14   CREATININE 1.35* 1.25* 1.15 1.00  CALCIUM 9.2 9.1 9.0 9.2  MG  --   --   --  2.0   GFR: Estimated Creatinine Clearance: 72.7 mL/min (by C-G formula based on SCr of 1 mg/dL). Liver Function Tests: No results for input(s): AST, ALT, ALKPHOS, BILITOT, PROT, ALBUMIN in the last 168 hours. No results for input(s): LIPASE, AMYLASE in the last 168 hours. No results for input(s): AMMONIA in the last 168 hours. Coagulation Profile: No results for input(s): INR, PROTIME in the last 168 hours. Cardiac Enzymes: No results for input(s): CKTOTAL, CKMB, CKMBINDEX, TROPONINI in the last 168 hours. BNP (last 3 results) No results for input(s): PROBNP in the last 8760 hours. HbA1C: No results for input(s): HGBA1C in the last 72 hours. CBG: Recent Labs  Lab 07/01/17 1150 07/01/17 1604 07/01/17 2110 07/02/17 0115 07/02/17 0730  GLUCAP 149* 189* 222* 235* 172*   Lipid Profile: No results for input(s): CHOL, HDL, LDLCALC, TRIG, CHOLHDL, LDLDIRECT in the last 72 hours. Thyroid Function Tests: No results for input(s): TSH, T4TOTAL, FREET4, T3FREE, THYROIDAB in the last 72 hours. Anemia Panel: No results for input(s): VITAMINB12, FOLATE, FERRITIN, TIBC, IRON, RETICCTPCT in the last 72 hours. Sepsis Labs: No results for input(s): PROCALCITON, LATICACIDVEN in the last 168 hours.  Recent Results (from the past 240 hour(s))  Surgical pcr screen     Status: None   Collection Time: 06/29/17 12:29 PM  Result Value Ref Range Status   MRSA, PCR NEGATIVE NEGATIVE Final   Staphylococcus aureus NEGATIVE NEGATIVE Final    Comment:  (NOTE) The Xpert SA Assay (FDA approved for NASAL specimens in patients 94 years of age and older), is one component of a comprehensive surveillance program. It is not intended to diagnose infection nor to guide or monitor treatment.          Radiology Studies: Dg Abd Portable 1v  Result Date: 06/30/2017 CLINICAL DATA:  Nasogastric tube placement EXAM: PORTABLE ABDOMEN - 1 VIEW COMPARISON:  CT scan of the abdomen of June 27, 2017 FINDINGS: The esophagogastric tube tip and proximal port lie in the gastric cardia. The bowel gas pattern is within the limits of normal today. There is patchy density at the right lung base laterally which likely reflects atelectasis and appears new. IMPRESSION: The esophagogastric tube tip in proximal port lie in the gastric cardia. Probable subsegmental atelectasis at the right lung base which is new. Electronically Signed   By: [name]  Martinique M.D.   On: 06/30/2017 12:41  Scheduled Meds: . amLODipine  5 mg Oral Daily  . bisoprolol  5 mg Oral Daily  . enoxaparin (LOVENOX) injection  40 mg Subcutaneous Q24H  . insulin aspart  0-15 Units Subcutaneous Q6H  . isosorbide mononitrate  30 mg Oral Daily  . pantoprazole (PROTONIX) IV  40 mg Intravenous Q24H  . rosuvastatin  20 mg Oral Daily   Continuous Infusions: . 0.9 % NaCl with KCl 20 mEq / L 75 mL/hr at 07/02/17 0426  . ferumoxytol Stopped (06/27/17 1356)     LOS: 6 days    Talia Hoheisel, MD PhD [facility] Pager [phone]  If 7PM-7AM, please contact night-coverage [email or web address] Password [email or web address] 07/02/2017, 11:06 AM

## 2017-07-02 NOTE — Progress Notes (Signed)
Pt NG tube clamped.

## 2017-07-02 NOTE — Progress Notes (Signed)
Inpatient Diabetes Program Recommendations  AACE/ADA: New Consensus Statement on Inpatient Glycemic Control (2015)  Target Ranges:  Prepandial:   less than 140 mg/dL      Peak postprandial:   less than 180 mg/dL (1-2 hours)      Critically ill patients:  140 - 180 mg/dL   Lab Results  Component Value Date   GLUCAP 165 (H) 07/02/2017   HGBA1C 8.6 (H) 06/26/2017    Review of Glycemic ControlResults for [name], [name] (MRN [id number]) as of 07/02/2017 13:41  Ref. Range 07/01/2017 16:04 07/01/2017 21:10 07/02/2017 01:15 07/02/2017 07:30 07/02/2017 11:33  Glucose-Capillary Latest Ref Range: 65 - 99 mg/dL 189 (H) 222 (H) 235 (H) 172 (H) 165 (H)   Home DM Meds:Amaryl 4 mg daily Metformin 500 mg daily  Current orders for Inpatient glycemic control:  Novolog moderate q 6 hours Inpatient Diabetes Program Recommendations:   Please consider adding Lantus 8 units daily while in the hospital.  Also may consider increasing frequency of Novolog correction to q 4 hours.   Thanks, Adah Perl, RN, BC-ADM Inpatient Diabetes Coordinator Pager [phone] (8a-5p)

## 2017-07-02 NOTE — Progress Notes (Signed)
[facility] Progress Note  2 Days Post-Op  Subjective: CC:  Pain controlled. +flatus. Denies BM. 400-500 cc per NG tube in 24 hours. Not mobilizing due to being hooked to wall suction.  NG 400-500 cc/24h. Afebrile, VSS Objective: Vital signs in last 24 hours: Temp:  [98.6 F (37 C)-99.2 F (37.3 C)] 99.2 F (37.3 C) (11/15 0537) Pulse Rate:  [62-84] 78 (11/15 0537) Resp:  [16-20] 20 (11/15 0537) BP: (166-175)/(86-88) 166/88 (11/15 0537) SpO2:  [98 %-100 %] 100 % (11/15 0537) Last BM Date: 06/29/17  Intake/Output from previous day: 11/14 0701 - 11/15 0700 In: -  Out: 1175 [Urine:875; Emesis/NG output:300] Intake/Output this shift: Total I/O In: -  Out: 500 [Emesis/NG output:500]  PE: Gen:  Alert, NAD, pleasant and cooperative Pulm:  Normal effort Abd: Soft, appropriately tender, non-distended, bowel sounds present but hypoactive, incision C/D/I w/ honeycomb in place and small amount dried sanguinous drainage.  Skin: warm and dry, no rashes  Psych: A&Ox3   Lab Results:  Recent Labs    07/01/17 0635 07/02/17 0530  WBC 15.4* 13.5*  HGB 9.5* 10.1*  HCT 30.2* 31.9*  PLT 251 251   BMET Recent Labs    07/01/17 0635 07/02/17 0530  NA 135 137  K 4.2 3.7  CL 102 102  CO2 26 27  GLUCOSE 203* 175*  BUN 13 14  CREATININE 1.15 1.00  CALCIUM 9.0 9.2   PT/INR No results for input(s): LABPROT, INR in the last 72 hours. CMP     Component Value Date/Time   NA 137 07/02/2017 0530   NA 137 06/22/2017 0946   K 3.7 07/02/2017 0530   CL 102 07/02/2017 0530   CO2 27 07/02/2017 0530   GLUCOSE 175 (H) 07/02/2017 0530   BUN 14 07/02/2017 0530   BUN 19 06/22/2017 0946   CREATININE 1.00 07/02/2017 0530   CREATININE 1.35 (H) 10/31/2015 1559   CALCIUM 9.2 07/02/2017 0530   PROT 6.9 06/22/2017 0946   ALBUMIN 4.0 06/22/2017 0946   AST 10 06/22/2017 0946   ALT 7 06/22/2017 0946   ALKPHOS 43 06/22/2017 0946   BILITOT 0.3 06/22/2017 0946   GFRNONAA >60  07/02/2017 0530   GFRAA >60 07/02/2017 0530   Lipase     Component Value Date/Time   LIPASE 45 02/27/2016 2014       Studies/Results: Dg Abd Portable 1v  Result Date: 06/30/2017 CLINICAL DATA:  Nasogastric tube placement EXAM: PORTABLE ABDOMEN - 1 VIEW COMPARISON:  CT scan of the abdomen of June 27, 2017 FINDINGS: The esophagogastric tube tip and proximal port lie in the gastric cardia. The bowel gas pattern is within the limits of normal today. There is patchy density at the right lung base laterally which likely reflects atelectasis and appears new. IMPRESSION: The esophagogastric tube tip in proximal port lie in the gastric cardia. Probable subsegmental atelectasis at the right lung base which is new. Electronically Signed   By: [name]  Martinique M.D.   On: 06/30/2017 12:41    Anti-infectives: Anti-infectives (From admission, onward)   Start     Dose/Rate Route Frequency Ordered Stop   06/30/17 1345  ceFAZolin (ANCEF) IVPB 2g/100 mL premix     2 g 200 mL/hr over 30 Minutes Intravenous On call to O.R. 06/29/17 1052 06/30/17 0741   06/30/17 0654  ceFAZolin (ANCEF) 2-4 GM/100ML-% IVPB    Comments:  [place]  : cabinet override      06/30/17 0654 06/30/17 0741   06/29/17 1100  ceFAZolin (ANCEF) IVPB 2g/100 mL premix  Status:  Discontinued     2 g 200 mL/hr over 30 Minutes Intravenous On call to O.R. 06/29/17 1044 06/29/17 1052     Assessment/Plan Chronic iron deficiency anemia HTN PMH CAD DM2  Small bowel mass S/P DIAGNOSTIC LAPAROSCOPY,SMALL BOWEL RESECTION 78/67/54, Dr. Leighton Ruff - POD#2 - follow surgical path - clamp NG tube and see how patient tolerates. Check residuals in 3-4 hours. Can D/C NG tube this afternoon if patient tolerates clamping trial.  - mobilize and IS  FEN: NPO, IVF  ID: perioperative Ancef 11/13 VTE: SCD's, ok to resume chemical VTE proph from surgical perspective.  Foley: D/Ced 11/14     LOS: 6 days    [facility] 07/02/2017, 9:23 AM Pager: [phone] Consults: [phone] Mon-Fri 7:00 am-4:30 pm Sat-Sun 7:00 am-11:30 am

## 2017-07-02 NOTE — Progress Notes (Signed)
NG tube was DC by MD on 07/02/17; patient has no symptoms of nausea; ambulated in hall and tolerated well.

## 2017-07-03 ENCOUNTER — Ambulatory Visit (HOSPITAL_COMMUNITY)
Admission: RE | Admit: 2017-07-03 | Payer: Medicare Other | Source: Ambulatory Visit | Attending: Cardiology | Admitting: Cardiology

## 2017-07-03 LAB — GLUCOSE, CAPILLARY
Glucose-Capillary: 123 mg/dL — ABNORMAL HIGH (ref 65–99)
Glucose-Capillary: 96 mg/dL (ref 65–99)
Glucose-Capillary: 208 mg/dL — ABNORMAL HIGH (ref 65–99)
Glucose-Capillary: 155 mg/dL — ABNORMAL HIGH (ref 65–99)
Glucose-Capillary: 208 mg/dL — ABNORMAL HIGH (ref 65–99)

## 2017-07-03 LAB — CBC WITH DIFFERENTIAL/PLATELET
Basophils Absolute: 0.1 10*3/uL (ref 0.0–0.1)
Basophils Relative: 1 %
Eosinophils Absolute: 0 10*3/uL (ref 0.0–0.7)
Eosinophils Relative: 0 %
HCT: 29.8 % — ABNORMAL LOW (ref 39.0–52.0)
Hemoglobin: 9.5 g/dL — ABNORMAL LOW (ref 13.0–17.0)
Lymphocytes Relative: 12 %
Lymphs Abs: 1.2 10*3/uL (ref 0.7–4.0)
MCH: 26.8 pg (ref 26.0–34.0)
MCHC: 31.9 g/dL (ref 30.0–36.0)
MCV: 84.2 fL (ref 78.0–100.0)
Monocytes Absolute: 0.9 10*3/uL (ref 0.1–1.0)
Monocytes Relative: 9 %
Neutro Abs: 8 10*3/uL — ABNORMAL HIGH (ref 1.7–7.7)
Neutrophils Relative %: 78 %
Platelets: 201 10*3/uL (ref 150–400)
RBC: 3.54 MIL/uL — ABNORMAL LOW (ref 4.22–5.81)
RDW: 24.8 % — ABNORMAL HIGH (ref 11.5–15.5)
WBC: 10.2 10*3/uL (ref 4.0–10.5)

## 2017-07-03 LAB — BASIC METABOLIC PANEL
Anion gap: 6 (ref 5–15)
BUN: 17 mg/dL (ref 6–20)
CO2: 26 mmol/L (ref 22–32)
Calcium: 8.8 mg/dL — ABNORMAL LOW (ref 8.9–10.3)
Chloride: 107 mmol/L (ref 101–111)
Creatinine, Ser: 1.15 mg/dL (ref 0.61–1.24)
GFR calc Af Amer: >60 mL/min (ref >60)
GFR calc non Af Amer: >60 mL/min (ref >60)
Glucose, Bld: 93 mg/dL (ref 65–99)
Potassium: 3.4 mmol/L — ABNORMAL LOW (ref 3.5–5.1)
Sodium: 139 mmol/L (ref 135–145)

## 2017-07-03 MED ORDER — BISACODYL 10 MG RE SUPP: 10.0000 mg | Freq: Two times a day (BID) | RECTAL | Status: DC | PRN

## 2017-07-03 MED ORDER — PANTOPRAZOLE SODIUM 20 MG PO TBEC
20.0000 mg | DELAYED_RELEASE_TABLET | Freq: Every day | ORAL | Status: DC
  Administered 2017-07-04: 20 mg via ORAL
  Filled 2017-07-03: qty 1

## 2017-07-03 MED ORDER — HYDROCODONE-ACETAMINOPHEN 5-325 MG PO TABS: 1.0000 | ORAL_TABLET | ORAL | Status: DC | PRN

## 2017-07-03 MED ORDER — CLOPIDOGREL BISULFATE 75 MG PO TABS
75.0000 mg | ORAL_TABLET | Freq: Every day | ORAL | Status: DC
  Administered 2017-07-03 – 2017-07-04 (×2): 75 mg via ORAL
  Filled 2017-07-03 (×2): qty 1

## 2017-07-03 MED ORDER — ASPIRIN EC 81 MG PO TBEC
81.0000 mg | DELAYED_RELEASE_TABLET | Freq: Every day | ORAL | Status: DC
  Administered 2017-07-03 – 2017-07-04 (×2): 81 mg via ORAL
  Filled 2017-07-03 (×2): qty 1

## 2017-07-03 NOTE — Progress Notes (Signed)
[facility] Progress Note  3 Days Post-Op  Subjective: CC:  Pain controlled. +flatus. Denies BM. No nausea overnight with NG out NG 400-500 cc/24h. Afebrile, VSS Objective: Vital signs in last 24 hours: Temp:  [98.6 F (37 C)-99.4 F (37.4 C)] 98.6 F (37 C) (11/16 0530) Pulse Rate:  [64-72] 64 (11/16 0530) Resp:  [16] 16 (11/16 0530) BP: (145-152)/(83-91) 145/91 (11/16 0530) SpO2:  [96 %-100 %] 100 % (11/16 0530) Last BM Date: 06/29/17  Intake/Output from previous day: 11/15 0701 - 11/16 0700 In: 1623.8 [I.V.:1623.8] Out: 1245 [Urine:600; Emesis/NG output:645] Intake/Output this shift: No intake/output data recorded.  PE: Gen:  Alert, NAD, pleasant and cooperative Pulm:  Normal effort Abd: Soft, appropriately tender, non-distended, incision C/D/I w/ honeycomb in place and small amount dried sanguinous drainage.  Skin: warm and dry, no rashes  Psych: A&Ox3   Lab Results:  Recent Labs    07/02/17 0530 07/03/17 0529  WBC 13.5* 10.2  HGB 10.1* 9.5*  HCT 31.9* 29.8*  PLT 251 201   BMET Recent Labs    07/02/17 0530 07/03/17 0529  NA 137 139  K 3.7 3.4*  CL 102 107  CO2 27 26  GLUCOSE 175* 93  BUN 14 17  CREATININE 1.00 1.15  CALCIUM 9.2 8.8*   PT/INR No results for input(s): LABPROT, INR in the last 72 hours. CMP     Component Value Date/Time   NA 139 07/03/2017 0529   NA 137 06/22/2017 0946   K 3.4 (L) 07/03/2017 0529   CL 107 07/03/2017 0529   CO2 26 07/03/2017 0529   GLUCOSE 93 07/03/2017 0529   BUN 17 07/03/2017 0529   BUN 19 06/22/2017 0946   CREATININE 1.15 07/03/2017 0529   CREATININE 1.35 (H) 10/31/2015 1559   CALCIUM 8.8 (L) 07/03/2017 0529   PROT 6.9 06/22/2017 0946   ALBUMIN 4.0 06/22/2017 0946   AST 10 06/22/2017 0946   ALT 7 06/22/2017 0946   ALKPHOS 43 06/22/2017 0946   BILITOT 0.3 06/22/2017 0946   GFRNONAA >60 07/03/2017 0529   GFRAA >60 07/03/2017 0529   Lipase     Component Value Date/Time   LIPASE 45  02/27/2016 2014       Studies/Results: No results found.  Anti-infectives: Anti-infectives (From admission, onward)   Start     Dose/Rate Route Frequency Ordered Stop   06/30/17 1345  ceFAZolin (ANCEF) IVPB 2g/100 mL premix     2 g 200 mL/hr over 30 Minutes Intravenous On call to O.R. 06/29/17 1052 06/30/17 0741   06/30/17 0654  ceFAZolin (ANCEF) 2-4 GM/100ML-% IVPB    Comments:  [place]  : cabinet override      06/30/17 0654 06/30/17 0741   06/29/17 1100  ceFAZolin (ANCEF) IVPB 2g/100 mL premix  Status:  Discontinued     2 g 200 mL/hr over 30 Minutes Intravenous On call to O.R. 06/29/17 1044 06/29/17 1052     Assessment/Plan Chronic iron deficiency anemia HTN PMH CAD DM2  Small bowel mass S/P DIAGNOSTIC LAPAROSCOPY,SMALL BOWEL RESECTION 08/26/30, Dr. Leighton Ruff - POD#3 - follow surgical path -start FLD today -PO pain meds - mobilize and IS  FEN: advancing diet, decreased IVF's ID: perioperative Ancef 11/13 VTE: SCD's, ok to resume chemical VTE proph from surgical perspective.  Foley: D/Ced 11/14     LOS: 7 days   Rosario Adie, MD  [facility]

## 2017-07-03 NOTE — Progress Notes (Signed)
PROGRESS NOTE    [name]  VQQ:[id number] DOB: 1946/08/13 DOA: 06/26/2017 PCP: [name], MD   Brief Narrative: 71 year old male with history of chronic iron deficiency anemia, occult small bowel bleeding concern for possible small bowel polyp versus mass in 2017, coronary artery disease with history of PCI on Plavix, peripheral vascular disease, chronic kidney disease, hypertension, hyperlipidemia, type 2 diabetes presented with shortness of breath, dyspnea on exertion in the setting of symptomatic acute on chronic anemia.  He was sent from cardiology office to ER due to anemia /sob. In ER patient was found to have hemoglobin of 6 received a unit of red blood cells with hemoglobin improved to 7.  S/p small bowel resection on 11/13.  Assessment & Plan:   #Acute on chronic anemia due to occult blood loss from his small bowel: Small bowel  mass lesion. -h/o gi bleed with extensive gi work up, last at [facility] -CT scan  On admission with a small bowel lesion concerning for neoplasm,like the site of bleeding.   -[facility] consulted initially who recommended ex-lap -s/p lap small bowel resection by general surgery on 11/13.  -ng removed, on full liquid diet, diet advancement per general surgery, will f/u surgery team for further plan.  - surgical pathology result pending -asa/ plavix held since admission, resumed on 11/16    #Chronic iron deficiency anemia: Received IV iron and 2 units of red blood cell transfusion.  Monitor CBC.  #Hypertension: Blood pressure acceptable.   Home bp meds resumed now that he is started on oral intake.  #History of coronary artery disease:  -followed by cardiology Dr Oval Linsey  -Patient does not have chest pain.  on bisoprolol,  Imdur and Crestor.   -Aspirin and Plavix held on admission, resumed on 11/16.  #noninsulin dependent Type 2 diabetes: Continue to monitor blood sugar level.  Continue sliding scale.  A1c 8.6. Home oral meds metformin and amaryl  held since admission  CKDII, renal function stable , close to baseline. Renal dosing meds.  DVT prophylaxis:SCD Code Status: Full code Family Communication: patient Disposition Plan: likely d/c home on 11/17, need  General surgery clearance for discharge. Need to follow up on surgical pathology result.    Consultants:   [facility]  Surgery  Procedures: small bowel surgery DIAGNOSTIC LAPAROSCOPY, SMALL BOWEL RESECTION on 11/13 by general surgery Dr Leighton Ruff    Antimicrobials: perioperative ancef  Subjective:   Feeling better,  + flatus, + bm, some incisional pain, no n/v, no fever, tolerating liquid diet    Objective: Vitals:   07/02/17 0537 07/02/17 1407 07/02/17 2039 07/03/17 0530  BP: (!) 166/88 (!) 149/83 (!) 152/83 (!) 145/91  Pulse: 78 72 70 64  Resp: 20  16 16   Temp: 99.2 F (37.3 C) 99.4 F (37.4 C) 98.8 F (37.1 C) 98.6 F (37 C)  TempSrc: Oral Oral Oral Oral  SpO2: 100% 98% 96% 100%  Weight:      Height:        Intake/Output Summary (Last 24 hours) at 07/03/2017 1254 Last data filed at 07/03/2017 1042 Gross per 24 hour  Intake 2177.5 ml  Output 95 ml  Net 2082.5 ml   Filed Weights   06/26/17 2154  Weight: 89.8 kg (198 lb)    Examination:  General exam: NAD,  Respiratory system: Clear b/l, no wheeze or crackle Cardiovascular system: RRR s1s2 nl, no murmur. Gastrointestinal system: Abd soft, ND. Surgical incision site C/D/I, honeycomb dressing in place, mild tender with surgical site, BS  more active today Central nervous system: Alert awake  Skin: No rashes, lesions or ulcers Psychiatry: calm and cooperative     Data Reviewed: I have personally reviewed following labs and imaging studies  CBC: Recent Labs  Lab 06/29/17 0522 06/30/17 0529 07/01/17 0635 07/02/17 0530 07/03/17 0529  WBC 7.1 6.8 15.4* 13.5* 10.2  NEUTROABS  --   --   --   --  8.0*  HGB 8.5* 8.8* 9.5* 10.1* 9.5*  HCT 27.1* 28.3* 30.2* 31.9* 29.8*  MCV 81.1 82.3  82.3 83.1 84.2  PLT 215 227 251 251 086   Basic Metabolic Panel: Recent Labs  Lab 06/26/17 1812 06/30/17 0529 07/01/17 0635 07/02/17 0530 07/03/17 0529  NA 135 138 135 137 139  K 4.0 4.0 4.2 3.7 3.4*  CL 100* 107 102 102 107  CO2 25 25 26 27 26   GLUCOSE 303* 161* 203* 175* 93  BUN 20 11 13 14 17   CREATININE 1.35* 1.25* 1.15 1.00 1.15  CALCIUM 9.2 9.1 9.0 9.2 8.8*  MG  --   --   --  2.0  --    GFR: Estimated Creatinine Clearance: 63.2 mL/min (by C-G formula based on SCr of 1.15 mg/dL). Liver Function Tests: No results for input(s): AST, ALT, ALKPHOS, BILITOT, PROT, ALBUMIN in the last 168 hours. No results for input(s): LIPASE, AMYLASE in the last 168 hours. No results for input(s): AMMONIA in the last 168 hours. Coagulation Profile: No results for input(s): INR, PROTIME in the last 168 hours. Cardiac Enzymes: No results for input(s): CKTOTAL, CKMB, CKMBINDEX, TROPONINI in the last 168 hours. BNP (last 3 results) No results for input(s): PROBNP in the last 8760 hours. HbA1C: No results for input(s): HGBA1C in the last 72 hours. CBG: Recent Labs  Lab 07/02/17 1133 07/02/17 1707 07/02/17 2043 07/03/17 0248 07/03/17 0727  GLUCAP 165* 157* 141* 123* 96   Lipid Profile: No results for input(s): CHOL, HDL, LDLCALC, TRIG, CHOLHDL, LDLDIRECT in the last 72 hours. Thyroid Function Tests: No results for input(s): TSH, T4TOTAL, FREET4, T3FREE, THYROIDAB in the last 72 hours. Anemia Panel: No results for input(s): VITAMINB12, FOLATE, FERRITIN, TIBC, IRON, RETICCTPCT in the last 72 hours. Sepsis Labs: No results for input(s): PROCALCITON, LATICACIDVEN in the last 168 hours.  Recent Results (from the past 240 hour(s))  Surgical pcr screen     Status: None   Collection Time: 06/29/17 12:29 PM  Result Value Ref Range Status   MRSA, PCR NEGATIVE NEGATIVE Final   Staphylococcus aureus NEGATIVE NEGATIVE Final    Comment: (NOTE) The Xpert SA Assay (FDA approved for NASAL  specimens in patients 70 years of age and older), is one component of a comprehensive surveillance program. It is not intended to diagnose infection nor to guide or monitor treatment.          Radiology Studies: No results found.      Scheduled Meds: . amLODipine  5 mg Oral Daily  . bisoprolol  5 mg Oral Daily  . enoxaparin (LOVENOX) injection  40 mg Subcutaneous Daily  . insulin aspart  0-15 Units Subcutaneous Q6H  . isosorbide mononitrate  30 mg Oral Daily  . pantoprazole (PROTONIX) IV  40 mg Intravenous Q24H  . rosuvastatin  20 mg Oral Daily   Continuous Infusions: . 0.9 % NaCl with KCl 20 mEq / L 10 mL/hr at 07/03/17 1042  . ferumoxytol Stopped (06/27/17 1356)     LOS: 7 days    Florencia Reasons, MD PhD [facility]  Pager [phone]  If 7PM-7AM, please contact night-coverage [email or web address] Password [email or web address] 07/03/2017, 12:54 PM

## 2017-07-04 LAB — MAGNESIUM: Magnesium: 1.9 mg/dL (ref 1.7–2.4)

## 2017-07-04 LAB — CBC WITH DIFFERENTIAL/PLATELET
Basophils Absolute: 0.1 10*3/uL (ref 0.0–0.1)
Basophils Relative: 2 %
Eosinophils Absolute: 0.1 10*3/uL (ref 0.0–0.7)
Eosinophils Relative: 2 %
HCT: 32.6 % — ABNORMAL LOW (ref 39.0–52.0)
Hemoglobin: 10.2 g/dL — ABNORMAL LOW (ref 13.0–17.0)
Lymphocytes Relative: 15 %
Lymphs Abs: 1.1 10*3/uL (ref 0.7–4.0)
MCH: 26.2 pg (ref 26.0–34.0)
MCHC: 31.3 g/dL (ref 30.0–36.0)
MCV: 83.8 fL (ref 78.0–100.0)
Monocytes Absolute: 0.6 10*3/uL (ref 0.1–1.0)
Monocytes Relative: 8 %
Neutro Abs: 5.2 10*3/uL (ref 1.7–7.7)
Neutrophils Relative %: 73 %
Platelets: 228 10*3/uL (ref 150–400)
RBC: 3.89 MIL/uL — ABNORMAL LOW (ref 4.22–5.81)
RDW: 24.1 % — ABNORMAL HIGH (ref 11.5–15.5)
WBC: 7.2 10*3/uL (ref 4.0–10.5)

## 2017-07-04 LAB — BASIC METABOLIC PANEL
Anion gap: 8 (ref 5–15)
BUN: 15 mg/dL (ref 6–20)
CO2: 24 mmol/L (ref 22–32)
Calcium: 9 mg/dL (ref 8.9–10.3)
Chloride: 105 mmol/L (ref 101–111)
Creatinine, Ser: 1.14 mg/dL (ref 0.61–1.24)
GFR calc Af Amer: >60 mL/min (ref >60)
GFR calc non Af Amer: >60 mL/min (ref >60)
Glucose, Bld: 136 mg/dL — ABNORMAL HIGH (ref 65–99)
Potassium: 3.7 mmol/L (ref 3.5–5.1)
Sodium: 137 mmol/L (ref 135–145)

## 2017-07-04 LAB — GLUCOSE, CAPILLARY
Glucose-Capillary: 102 mg/dL — ABNORMAL HIGH (ref 65–99)
Glucose-Capillary: 126 mg/dL — ABNORMAL HIGH (ref 65–99)

## 2017-07-04 MED ORDER — GLIMEPIRIDE 4 MG PO TABS: 4.0000 mg | ORAL_TABLET | Freq: Every day | ORAL | 0 refills | Status: DC

## 2017-07-04 MED ORDER — SODIUM CHLORIDE 0.9% FLUSH: 3.0000 mL | INTRAVENOUS | Status: DC | PRN

## 2017-07-04 MED ORDER — SODIUM CHLORIDE 0.9% FLUSH
3.0000 mL | Freq: Two times a day (BID) | INTRAVENOUS | Status: DC
  Administered 2017-07-04: 3 mL via INTRAVENOUS

## 2017-07-04 MED ORDER — SODIUM CHLORIDE 0.9 % IV SOLN: 250.0000 mL | INTRAVENOUS | Status: DC | PRN

## 2017-07-04 MED ORDER — POLYETHYLENE GLYCOL 3350 17 G PO PACK: 17.0000 g | PACK | Freq: Every day | ORAL | 0 refills | Status: DC | PRN

## 2017-07-04 NOTE — Discharge Instructions (Signed)

## 2017-07-04 NOTE — Discharge Summary (Signed)
Physician Discharge Summary  [name] TDD:[id number] DOB: 08-16-1946 DOA: 06/26/2017  PCP: [name], MD  Admit date: 06/26/2017 Discharge date: 07/04/2017  Admitted From:home Disposition:home  Recommendations for Outpatient Follow-up:  1. Follow up with PCP in 1-2 weeks 2. Please obtain BMP/CBC in one week  Home Health:no Equipment/Devices:none Discharge Condition:stable CODE STATUS:full code Diet recommendation:soft/carb modified diet  Brief/Interim Summary: 71 year old male with history of chronic iron deficiency anemia, occult small bowel bleeding concern for possible small bowel polyp versus mass in 2017, coronary artery disease with history of PCI on Plavix, peripheral vascular disease, chronic kidney disease, hypertension, hyperlipidemia, type 2 diabetes presented with shortness of breath, dyspnea on exertion in the setting of symptomatic acute on chronic anemia.  In ER patient was found to have hemoglobin of 6 received a unit of red blood cells with hemoglobin improved to 7.  S/p small bowel resection on 11/13.  #Acute on chronic anemia due to occult blood loss from his small bowel: Small bowel  mass lesion. -CT scan with a small bowel lesion concerning for neoplasm,like the site of bleeding.   -s/p lap small bowel resection by general surgery.  Patient clinically improved.  NG tube removed and he is able to tolerate diet well.  Denies nausea vomiting or abdominal pain.  Eager to go home.  Discharging by surgery.  Recommended to follow-up the biopsy results with general surgery and continue to follow-up with GI.    #Chronic iron deficiency anemia: Received IV iron and 2 units of red blood cell transfusion.    Hemoglobin is stable.  #Hypertension: Blood pressure acceptable.    Resume home medication.  #History of coronary artery disease: Resume aspirin Plavix, bisoprolol, Crestor.  He does not have chest pain or shortness of breath.  #Type 2 diabetes: Resume home  dose of oral hypoglycemic agent.  Recommended to monitor blood sugar level. A1c 8.6.  Patient is clinically stable.  Tolerating diet well.  MiraLAX as needed for the management of constipation.  Recommended to follow-up with PCP, GI and general surgery.   Discharge Diagnoses:  Active Problems:   DM2 (diabetes mellitus, type 2) (HCC)   Anemia, iron deficiency   Essential hypertension   CAD S/P percutaneous coronary angioplasty   DOE (dyspnea on exertion)   Iron deficiency anemia due to chronic blood loss   Mass of small intestine with bleeding ?angiodysplasia   Chronic anticoagulation   Abnormal CT scan, small bowel    Discharge Instructions  Discharge Instructions    Call MD for:  difficulty breathing, headache or visual disturbances   Complete by:  As directed    Call MD for:  extreme fatigue   Complete by:  As directed    Call MD for:  hives   Complete by:  As directed    Call MD for:  persistant dizziness or light-headedness   Complete by:  As directed    Call MD for:  persistant nausea and vomiting   Complete by:  As directed    Call MD for:  redness, tenderness, or signs of infection (pain, swelling, redness, odor or green/yellow discharge around incision site)   Complete by:  As directed    Call MD for:  severe uncontrolled pain   Complete by:  As directed    Call MD for:  temperature >100.4   Complete by:  As directed    Diet Carb Modified   Complete by:  As directed    Soft diet   Discharge instructions  Complete by:  As directed    Follow up the biopsy result with general surgery.   Increase activity slowly   Complete by:  As directed      Allergies as of 07/04/2017   No Known Allergies     Medication List    TAKE these medications   amLODipine 5 MG tablet Commonly known as:  NORVASC Take 1 tablet (5 mg total) by mouth daily.   aspirin 81 MG tablet Take 1 tablet (81 mg total) by mouth daily.   atorvastatin 40 MG tablet Commonly known as:   LIPITOR Take 40 mg daily by mouth.   bisoprolol 5 MG tablet Commonly known as:  ZEBETA Take 1 tablet (5 mg total) by mouth daily.   chlorthalidone 25 MG tablet Commonly known as:  HYGROTON Take 1 tablet (25 mg total) by mouth daily.   clopidogrel 75 MG tablet Commonly known as:  PLAVIX Take 75 mg daily by mouth.   ferrous sulfate 325 (65 FE) MG EC tablet Take 1 tablet (325 mg total) by mouth 2 (two) times daily with a meal.   glimepiride 4 MG tablet Commonly known as:  AMARYL Take 1 tablet (4 mg total) daily before breakfast by mouth. Additional refills from PCP   isosorbide mononitrate 30 MG 24 hr tablet Commonly known as:  IMDUR Take 1 tablet (30 mg total) daily by mouth.   metFORMIN 500 MG 24 hr tablet Commonly known as:  GLUCOPHAGE-XR Take 1 tablet (500 mg total) by mouth daily with breakfast. ADDITIONAL REFILLS FROM PCP   polyethylene glycol packet Commonly known as:  MIRALAX Take 17 g daily as needed by mouth for mild constipation.   rosuvastatin 20 MG tablet Commonly known as:  CRESTOR Take 1 tablet (20 mg total) by mouth daily.      Follow-up Information    Leighton Ruff, MD. Schedule an appointment as soon as possible for a visit in 2 week(s).   Specialty:  General Surgery Contact information: [address] [place] [address] [phone]        [name], MD. Schedule an appointment as soon as possible for a visit in 1 week(s).   Specialty:  Family Medicine Contact information: [place] [address] [phone]        Carol Ada, MD Follow up.   Specialty:  Gastroenterology Contact information: [address] [place] [address] [phone]          No Known Allergies  Consultations: General surgery GI  Procedures/Studies: Small bowel surgery  Subjective: Seen and examined at bedside.  Eager to go home today.  Denies headache, dizziness, nausea vomiting chest pain  shortness of breath.  Denies abdominal pain.  Discharge Exam: Vitals:   07/04/17 0518 07/04/17 0800  BP: 137/66 (!) 147/72  Pulse: 61 75  Resp: 16 17  Temp: 98.4 F (36.9 C) 98.9 F (37.2 C)  SpO2: 100% 100%   Vitals:   07/03/17 1327 07/03/17 2003 07/04/17 0518 07/04/17 0800  BP: 139/75 (!) 145/82 137/66 (!) 147/72  Pulse: 66 67 61 75  Resp: 16 18 16 17   Temp: 98.6 F (37 C) 98.7 F (37.1 C) 98.4 F (36.9 C) 98.9 F (37.2 C)  TempSrc: Oral Oral Oral Oral  SpO2: 100% 100% 100% 100%  Weight:      Height:        General: Pt is alert, awake, not in acute distress Cardiovascular: RRR, S1/S2 +, no rubs, no gallops  Respiratory: CTA bilaterally, no wheezing, no rhonchi Abdominal: Soft, NT, ND, bowel sounds +.  Incision site healing well Extremities: no edema, no cyanosis    The results of significant diagnostics from this hospitalization (including imaging, microbiology, ancillary and laboratory) are listed below for reference.     Microbiology: Recent Results (from the past 240 hour(s))  Surgical pcr screen     Status: None   Collection Time: 06/29/17 12:29 PM  Result Value Ref Range Status   MRSA, PCR NEGATIVE NEGATIVE Final   Staphylococcus aureus NEGATIVE NEGATIVE Final    Comment: (NOTE) The Xpert SA Assay (FDA approved for NASAL specimens in patients 94 years of age and older), is one component of a comprehensive surveillance program. It is not intended to diagnose infection nor to guide or monitor treatment.      Labs: BNP (last 3 results) Recent Labs    06/26/17 1812  BNP 08.6   Basic Metabolic Panel: Recent Labs  Lab 06/30/17 0529 07/01/17 0635 07/02/17 0530 07/03/17 0529 07/04/17 0600  NA 138 135 137 139 137  K 4.0 4.2 3.7 3.4* 3.7  CL 107 102 102 107 105  CO2 25 26 27 26 24   GLUCOSE 161* 203* 175* 93 136*  BUN 11 13 14 17 15   CREATININE 1.25* 1.15 1.00 1.15 1.14  CALCIUM 9.1 9.0 9.2 8.8* 9.0  MG  --   --  2.0  --  1.9   Liver Function  Tests: No results for input(s): AST, ALT, ALKPHOS, BILITOT, PROT, ALBUMIN in the last 168 hours. No results for input(s): LIPASE, AMYLASE in the last 168 hours. No results for input(s): AMMONIA in the last 168 hours. CBC: Recent Labs  Lab 06/30/17 0529 07/01/17 0635 07/02/17 0530 07/03/17 0529 07/04/17 0600  WBC 6.8 15.4* 13.5* 10.2 7.2  NEUTROABS  --   --   --  8.0* 5.2  HGB 8.8* 9.5* 10.1* 9.5* 10.2*  HCT 28.3* 30.2* 31.9* 29.8* 32.6*  MCV 82.3 82.3 83.1 84.2 83.8  PLT 227 251 251 201 228   Cardiac Enzymes: No results for input(s): CKTOTAL, CKMB, CKMBINDEX, TROPONINI in the last 168 hours. BNP: Invalid input(s): POCBNP CBG: Recent Labs  Lab 07/03/17 1343 07/03/17 1733 07/03/17 2000 07/04/17 0212 07/04/17 0754  GLUCAP 208* 155* 208* 102* 126*   D-Dimer No results for input(s): DDIMER in the last 72 hours. Hgb A1c No results for input(s): HGBA1C in the last 72 hours. Lipid Profile No results for input(s): CHOL, HDL, LDLCALC, TRIG, CHOLHDL, LDLDIRECT in the last 72 hours. Thyroid function studies No results for input(s): TSH, T4TOTAL, T3FREE, THYROIDAB in the last 72 hours.  Invalid input(s): FREET3 Anemia work up No results for input(s): VITAMINB12, FOLATE, FERRITIN, TIBC, IRON, RETICCTPCT in the last 72 hours. Urinalysis    Component Value Date/Time   COLORURINE YELLOW 02/27/2016 1700   APPEARANCEUR CLEAR 02/27/2016 1700   LABSPEC 1.012 02/27/2016 1700   PHURINE 5.0 02/27/2016 1700   GLUCOSEU NEGATIVE 02/27/2016 1700   HGBUR NEGATIVE 02/27/2016 1700   BILIRUBINUR NEGATIVE 02/27/2016 1700   BILIRUBINUR neg 10/17/2014 2042   KETONESUR NEGATIVE 02/27/2016 1700   PROTEINUR NEGATIVE 02/27/2016 1700   UROBILINOGEN 0.2 10/17/2014 2042   UROBILINOGEN 0.2 08/26/2010 0414   NITRITE NEGATIVE 02/27/2016 1700   LEUKOCYTESUR NEGATIVE 02/27/2016 1700   Sepsis Labs Invalid input(s): PROCALCITONIN,  WBC,  LACTICIDVEN Microbiology Recent Results (from the past 240  hour(s))  Surgical pcr screen     Status: None   Collection Time: 06/29/17  12:29 PM  Result Value Ref Range Status   MRSA, PCR NEGATIVE NEGATIVE Final   Staphylococcus aureus NEGATIVE NEGATIVE Final    Comment: (NOTE) The Xpert SA Assay (FDA approved for NASAL specimens in patients 46 years of age and older), is one component of a comprehensive surveillance program. It is not intended to diagnose infection nor to guide or monitor treatment.      Time coordinating discharge: 30 minutes  SIGNED:   Rosita Fire, MD  [facility] 07/04/2017, 10:49 AM  If 7PM-7AM, please contact night-coverage [email or web address] Password [email or web address]

## 2017-07-04 NOTE — Final Consult Note (Signed)
Consultant Final Sign-Off Note    Assessment/Final recommendations  [name] is a 71 y.o. male followed by me for Small bowel resection   Wound care (if applicable): ok to shower.  Can place dressing for comfort or drainage   Diet at discharge: bland, advance as tolerated   Activity at discharge: ad lib   Follow-up appointment:  In 2 wks with [facility] PA or Dr Marcello Moores   Pending results:  Unresulted Labs (From admission, onward)   None       Medication recommendations:   Other recommendations:    Thank you for allowing [place] to participate in the care of your patient!  Please consult [place] again if you have further needs for your patient.  [name] C. 16/05/9603 5:40 AM    Subjective   Tolerating diet, having flatus  Objective  Vital signs in last 24 hours: Temp:  [98.4 F (36.9 C)-98.7 F (37.1 C)] 98.4 F (36.9 C) (11/17 0518) Pulse Rate:  [61-67] 61 (11/17 0518) Resp:  [16-18] 16 (11/17 0518) BP: (137-145)/(66-82) 137/66 (11/17 0518) SpO2:  [100 %] 100 % (11/17 0518)  General: NAD Abd: soft Inc: clean, dry, intact    Pertinent labs and Studies: Recent Labs    07/02/17 0530 07/03/17 0529 07/04/17 0600  WBC 13.5* 10.2 7.2  HGB 10.1* 9.5* 10.2*  HCT 31.9* 29.8* 32.6*   BMET Recent Labs    07/03/17 0529 07/04/17 0600  NA 139 137  K 3.4* 3.7  CL 107 105  CO2 26 24  GLUCOSE 93 136*  BUN 17 15  CREATININE 1.15 1.14  CALCIUM 8.8* 9.0   No results for input(s): LABURIN in the last 72 hours. Results for orders placed or performed during the hospital encounter of 06/26/17  Surgical pcr screen     Status: None   Collection Time: 06/29/17 12:29 PM  Result Value Ref Range Status   MRSA, PCR NEGATIVE NEGATIVE Final   Staphylococcus aureus NEGATIVE NEGATIVE Final    Comment: (NOTE) The Xpert SA Assay (FDA approved for NASAL specimens in patients 59 years of age and older), is one component of a comprehensive surveillance program. It is not  intended to diagnose infection nor to guide or monitor treatment.     Imaging: No results found.

## 2017-07-04 NOTE — Progress Notes (Signed)
Discharge instructions and medications discussed with patient.  Prescriptions and AVS given to patient. All questions answered.  

## 2017-07-14 ENCOUNTER — Encounter: Payer: Self-pay | Admitting: Genetics

## 2017-07-15 ENCOUNTER — Telehealth: Payer: Self-pay | Admitting: Obstetrics and Gynecology

## 2017-07-15 NOTE — Telephone Encounter (Signed)
I admitted this pt to the hospital. Reviewing chart today I see biopsy results not available at time of discharge. Plan for outpt gen surg f/u. I recalled that this pt has low health literacy and does not have a PCP and has been resistant to f/u in the past, so I was concerned that he may not receive f/u. I reviewed biopsy results and they show neuroendocrine tumor. I called pt and this was news to him. He does have gen surg f/u 12/3. He does not have a PCP. I STRONGLY recommend he make that appt and he said he would. He does see a cardiologist regularly and I advised that should he encounter any difficulties with appropriate treatment/referrals, to schedule close f/u with the cardiologist.

## 2017-08-19 ENCOUNTER — Other Ambulatory Visit: Payer: Self-pay | Admitting: Cardiovascular Disease

## 2017-08-19 DIAGNOSIS — E1151 Type 2 diabetes mellitus with diabetic peripheral angiopathy without gangrene: Secondary | ICD-10-CM

## 2017-08-19 NOTE — Telephone Encounter (Signed)
Refill Request.  

## 2017-09-01 ENCOUNTER — Ambulatory Visit: Payer: Medicare HMO | Attending: Internal Medicine | Admitting: Internal Medicine

## 2017-09-01 ENCOUNTER — Encounter: Payer: Self-pay | Admitting: Internal Medicine

## 2017-09-01 VITALS — BP 178/90 | HR 85 | Temp 98.2°F | Resp 16 | Ht 68.0 in | Wt 198.4 lb

## 2017-09-01 DIAGNOSIS — I5031 Acute diastolic (congestive) heart failure: Secondary | ICD-10-CM | POA: Diagnosis not present

## 2017-09-01 DIAGNOSIS — E1122 Type 2 diabetes mellitus with diabetic chronic kidney disease: Secondary | ICD-10-CM | POA: Insufficient documentation

## 2017-09-01 DIAGNOSIS — D3A019 Benign carcinoid tumor of the small intestine, unspecified portion: Secondary | ICD-10-CM | POA: Diagnosis not present

## 2017-09-01 DIAGNOSIS — Z7982 Long term (current) use of aspirin: Secondary | ICD-10-CM | POA: Diagnosis not present

## 2017-09-01 DIAGNOSIS — E118 Type 2 diabetes mellitus with unspecified complications: Secondary | ICD-10-CM | POA: Diagnosis not present

## 2017-09-01 DIAGNOSIS — IMO0002 Reserved for concepts with insufficient information to code with codable children: Secondary | ICD-10-CM

## 2017-09-01 DIAGNOSIS — D3A8 Other benign neuroendocrine tumors: Secondary | ICD-10-CM

## 2017-09-01 DIAGNOSIS — Z23 Encounter for immunization: Secondary | ICD-10-CM | POA: Insufficient documentation

## 2017-09-01 DIAGNOSIS — D500 Iron deficiency anemia secondary to blood loss (chronic): Secondary | ICD-10-CM | POA: Diagnosis not present

## 2017-09-01 DIAGNOSIS — Z87891 Personal history of nicotine dependence: Secondary | ICD-10-CM | POA: Insufficient documentation

## 2017-09-01 DIAGNOSIS — Z9889 Other specified postprocedural states: Secondary | ICD-10-CM | POA: Insufficient documentation

## 2017-09-01 DIAGNOSIS — Z7984 Long term (current) use of oral hypoglycemic drugs: Secondary | ICD-10-CM | POA: Diagnosis not present

## 2017-09-01 DIAGNOSIS — E1165 Type 2 diabetes mellitus with hyperglycemia: Secondary | ICD-10-CM | POA: Diagnosis not present

## 2017-09-01 DIAGNOSIS — I10 Essential (primary) hypertension: Secondary | ICD-10-CM

## 2017-09-01 DIAGNOSIS — I2510 Atherosclerotic heart disease of native coronary artery without angina pectoris: Secondary | ICD-10-CM | POA: Insufficient documentation

## 2017-09-01 DIAGNOSIS — N189 Chronic kidney disease, unspecified: Secondary | ICD-10-CM | POA: Diagnosis not present

## 2017-09-01 DIAGNOSIS — I130 Hypertensive heart and chronic kidney disease with heart failure and stage 1 through stage 4 chronic kidney disease, or unspecified chronic kidney disease: Secondary | ICD-10-CM | POA: Diagnosis not present

## 2017-09-01 DIAGNOSIS — Z79899 Other long term (current) drug therapy: Secondary | ICD-10-CM | POA: Diagnosis not present

## 2017-09-01 LAB — GLUCOSE, POCT (MANUAL RESULT ENTRY): POC Glucose: 191 mg/dl — AB (ref 70–99)

## 2017-09-01 MED ORDER — ROSUVASTATIN CALCIUM 20 MG PO TABS: 20.0000 mg | ORAL_TABLET | Freq: Every day | ORAL | 5 refills | Status: DC

## 2017-09-01 MED ORDER — METFORMIN HCL ER 500 MG PO TB24: 500.0000 mg | ORAL_TABLET | Freq: Two times a day (BID) | ORAL | 6 refills | Status: DC

## 2017-09-01 MED ORDER — CLOPIDOGREL BISULFATE 75 MG PO TABS: 75.0000 mg | ORAL_TABLET | Freq: Every day | ORAL | 3 refills | Status: DC

## 2017-09-01 NOTE — Patient Instructions (Addendum)
Increase Metformin to 500 mg twice a day. Restart your cholesterol medication Rosuvastatin.  You have been referred to gastroenterologist and eye doctor.    Influenza Virus Vaccine injection (Fluarix) What is this medicine? INFLUENZA VIRUS VACCINE (in floo EN zuh VAHY ruhs vak SEEN) helps to reduce the risk of getting influenza also known as the flu. This medicine may be used for other purposes; ask your health care provider or pharmacist if you have questions. COMMON BRAND NAME(S): Fluarix, Fluzone What should I tell my health care provider before I take this medicine? They need to know if you have any of these conditions: -bleeding disorder like hemophilia -fever or infection -Guillain-Barre syndrome or other neurological problems -immune system problems -infection with the human immunodeficiency virus (HIV) or AIDS -low blood platelet counts -multiple sclerosis -an unusual or allergic reaction to influenza virus vaccine, eggs, chicken proteins, latex, gentamicin, other medicines, foods, dyes or preservatives -pregnant or trying to get pregnant -breast-feeding How should I use this medicine? This vaccine is for injection into a muscle. It is given by a health care professional. A copy of Vaccine Information Statements will be given before each vaccination. Read this sheet carefully each time. The sheet may change frequently. Talk to your pediatrician regarding the use of this medicine in children. Special care may be needed. Overdosage: If you think you have taken too much of this medicine contact a poison control center or emergency room at once. NOTE: This medicine is only for you. Do not share this medicine with others. What if I miss a dose? This does not apply. What may interact with this medicine? -chemotherapy or radiation therapy -medicines that lower your immune system like etanercept, anakinra, infliximab, and adalimumab -medicines that treat or prevent blood clots like  warfarin -phenytoin -steroid medicines like prednisone or cortisone -theophylline -vaccines This list may not describe all possible interactions. Give your health care provider a list of all the medicines, herbs, non-prescription drugs, or dietary supplements you use. Also tell them if you smoke, drink alcohol, or use illegal drugs. Some items may interact with your medicine. What should I watch for while using this medicine? Report any side effects that do not go away within 3 days to your doctor or health care professional. Call your health care provider if any unusual symptoms occur within 6 weeks of receiving this vaccine. You may still catch the flu, but the illness is not usually as bad. You cannot get the flu from the vaccine. The vaccine will not protect against colds or other illnesses that may cause fever. The vaccine is needed every year. What side effects may I notice from receiving this medicine? Side effects that you should report to your doctor or health care professional as soon as possible: -allergic reactions like skin rash, itching or hives, swelling of the face, lips, or tongue Side effects that usually do not require medical attention (report to your doctor or health care professional if they continue or are bothersome): -fever -headache -muscle aches and pains -pain, tenderness, redness, or swelling at site where injected -weak or tired This list may not describe all possible side effects. Call your doctor for medical advice about side effects. You may report side effects to FDA at 1-800-FDA-1088. Where should I keep my medicine? This vaccine is only given in a clinic, pharmacy, doctor's office, or other health care setting and will not be stored at home. NOTE: This sheet is a summary. It may not cover all possible information. If  you have questions about this medicine, talk to your doctor, pharmacist, or health care provider.  2018 Elsevier/Gold Standard (2008-03-01  09:30:40)  Pneumococcal Conjugate Vaccine (PCV13) What You Need to Know 1. Why get vaccinated? Vaccination can protect both children and adults from pneumococcal disease. Pneumococcal disease is caused by bacteria that can spread from person to person through close contact. It can cause ear infections, and it can also lead to more serious infections of the:  Lungs (pneumonia),  Blood (bacteremia), and  Covering of the brain and spinal cord (meningitis).  Pneumococcal pneumonia is most common among adults. Pneumococcal meningitis can cause deafness and brain damage, and it kills about 1 child in 10 who get it. Anyone can get pneumococcal disease, but children under 62 years of age and adults 48 years and older, people with certain medical conditions, and cigarette smokers are at the highest risk. Before there was a vaccine, the [place] saw:  more than 700 cases of meningitis,  about 13,000 blood infections,  about 5 million ear infections, and  about 200 deaths  in children under 5 each year from pneumococcal disease. Since vaccine became available, severe pneumococcal disease in these children has fallen by 88%. About 18,000 older adults die of pneumococcal disease each year in the [place]. Treatment of pneumococcal infections with penicillin and other drugs is not as effective as it used to be, because some strains of the disease have become resistant to these drugs. This makes prevention of the disease, through vaccination, even more important. 2. PCV13 vaccine Pneumococcal conjugate vaccine (called PCV13) protects against 13 types of pneumococcal bacteria. PCV13 is routinely given to children at 2, 4, 6, and 93-24 months of age. It is also recommended for children and adults 56 to 34 years of age with certain health conditions, and for all adults 67 years of age and older. Your doctor can give you details. 3. Some people should not get this vaccine Anyone who has ever had  a life-threatening allergic reaction to a dose of this vaccine, to an earlier pneumococcal vaccine called PCV7, or to any vaccine containing diphtheria toxoid (for example, DTaP), should not get PCV13. Anyone with a severe allergy to any component of PCV13 should not get the vaccine. Tell your doctor if the person being vaccinated has any severe allergies. If the person scheduled for vaccination is not feeling well, your healthcare provider might decide to reschedule the shot on another day. 4. Risks of a vaccine reaction With any medicine, including vaccines, there is a chance of reactions. These are usually mild and go away on their own, but serious reactions are also possible. Problems reported following PCV13 varied by age and dose in the series. The most common problems reported among children were:  About half became drowsy after the shot, had a temporary loss of appetite, or had redness or tenderness where the shot was given.  About 1 out of 3 had swelling where the shot was given.  About 1 out of 3 had a mild fever, and about 1 in 20 had a fever over 102.41F.  Up to about 8 out of 10 became fussy or irritable.  Adults have reported pain, redness, and swelling where the shot was given; also mild fever, fatigue, headache, chills, or muscle pain. Young children who get PCV13 along with inactivated flu vaccine at the same time may be at increased risk for seizures caused by fever. Ask your doctor for more information. Problems that could happen after  any vaccine:  People sometimes faint after a medical procedure, including vaccination. Sitting or lying down for about 15 minutes can help prevent fainting, and injuries caused by a fall. Tell your doctor if you feel dizzy, or have vision changes or ringing in the ears.  Some older children and adults get severe pain in the shoulder and have difficulty moving the arm where a shot was given. This happens very rarely.  Any medication can cause a  severe allergic reaction. Such reactions from a vaccine are very rare, estimated at about 1 in a million doses, and would happen within a few minutes to a few hours after the vaccination. As with any medicine, there is a very small chance of a vaccine causing a serious injury or death. The safety of vaccines is always being monitored. For more information, visit: http://www.aguilar.org/ 5. What if there is a serious reaction? What should I look for? Look for anything that concerns you, such as signs of a severe allergic reaction, very high fever, or unusual behavior. Signs of a severe allergic reaction can include hives, swelling of the face and throat, difficulty breathing, a fast heartbeat, dizziness, and weakness-usually within a few minutes to a few hours after the vaccination. What should I do?  If you think it is a severe allergic reaction or other emergency that can't wait, call 9-1-1 or get the person to the nearest hospital. Otherwise, call your doctor.  Reactions should be reported to the Vaccine Adverse Event Reporting System (VAERS). Your doctor should file this report, or you can do it yourself through the VAERS web site at www.vaers.SamedayNews.es, or by calling 340-178-4260. ? VAERS does not give medical advice. 6. The National Vaccine Injury Compensation Program The [employer] Vaccine Injury Compensation Program (VICP) is a federal program that was created to compensate people who may have been injured by certain vaccines. Persons who believe they may have been injured by a vaccine can learn about the program and about filing a claim by calling 279-535-2252 or visiting the [place] website at GoldCloset.com.ee. There is a time limit to file a claim for compensation. 7. How can I learn more?  Ask your healthcare provider. He or she can give you the vaccine package insert or suggest other sources of information.  Call your local or state health department.  Contact the  Centers for Disease Control and Prevention (CDC): ? Call 617-665-1215 (1-800-CDC-INFO) or ? Visit CDC's website at http://hunter.com/ Vaccine Information Statement, PCV13 Vaccine (06/22/2014) This information is not intended to replace advice given to you by your health care provider. Make sure you discuss any questions you have with your health care provider. Document Released: 06/01/2006 Document Revised: 04/24/2016 Document Reviewed: 04/24/2016 Elsevier Interactive Patient Education  2017 [employer].

## 2017-09-01 NOTE — Progress Notes (Deleted)
Patient ID: [name], male    DOB: 22-Sep-1945  MRN: [id number]  CC: New Patient (Initial Visit)   Subjective: [name] is a 72 y.o. male who presents for new patient visit.  No PCP, his cardiologist Dr. Skeet Latch was filling his meds.  His concerns today include:  Patient with history of chronic IDA, occult small bowel bleeding, CAD with history PCI on Plavix, PAD, CKD, HTN, HL, diabetes type 2  1.  Small Bowel resection/well diff Neuroendocrine tumor:  -no blood in stools or black stools No abdominal pain  2.  DM:  -checking BS 1-2 x a day several times a wk -Med: compliant  Eating habits:  I try to avoid the sweets.  Drinks a lot of water and coffee. Exercise: walking 30-45 mins most days.  3.  CAD/HTN:  Patient Active Problem List   Diagnosis Date Noted  . Mass of small intestine with bleeding ?angiodysplasia 06/28/2017  . Chronic anticoagulation 06/28/2017  . Abnormal CT scan, small bowel   . Iron deficiency anemia due to chronic blood loss 06/26/2017  . Symptomatic anemia   . DOE (dyspnea on exertion) 06/25/2017  . Acute diastolic CHF (congestive heart failure) ([place]) 06/25/2017  . Hypertensive cardiovascular disease 06/25/2017  . PVC's (premature ventricular contractions) 01/26/2017  . Pre-syncope 01/25/2017  . CAD S/P percutaneous coronary angioplasty 10/03/2016  . Anemia due to chronic blood loss 02/28/2016  . Chest pain 02/28/2016  . Chest pain with high risk for cardiac etiology 04/22/2015  . Claudication in peripheral vascular disease ([place]) 04/22/2015  . Anemia, iron deficiency 10/17/2014  . Essential hypertension 10/17/2014  . DM2 (diabetes mellitus, type 2) ([place]) 04/08/2012  . PUD (peptic ulcer disease) 11/11/2011     Current Outpatient Medications on File Prior to Visit  Medication Sig Dispense Refill  . amLODipine (NORVASC) 5 MG tablet Take 1 tablet (5 mg total) by mouth daily. 30 tablet 5  . aspirin 81 MG tablet Take 1 tablet (81  mg total) by mouth daily. 30 tablet 11  . bisoprolol (ZEBETA) 5 MG tablet Take 1 tablet (5 mg total) by mouth daily. 30 tablet 5  . chlorthalidone (HYGROTON) 25 MG tablet Take 1 tablet (25 mg total) by mouth daily. 30 tablet 5  . ferrous sulfate 325 (65 FE) MG EC tablet Take 1 tablet (325 mg total) by mouth 2 (two) times daily with a meal. 60 tablet 5  . glimepiride (AMARYL) 4 MG tablet Take 1 tablet (4 mg total) daily before breakfast by mouth. Additional refills from PCP 30 tablet 0  . isosorbide mononitrate (IMDUR) 30 MG 24 hr tablet Take 1 tablet (30 mg total) daily by mouth. 30 tablet 3  . metFORMIN (GLUCOPHAGE-XR) 500 MG 24 hr tablet Take 1 tablet (500 mg total) by mouth daily with breakfast. ADDITIONAL REFILLS FROM PCP 30 tablet 1  . rosuvastatin (CRESTOR) 20 MG tablet Take 1 tablet (20 mg total) by mouth daily. 30 tablet 5  . atorvastatin (LIPITOR) 40 MG tablet Take 40 mg daily by mouth.    . clopidogrel (PLAVIX) 75 MG tablet Take 75 mg daily by mouth.    . polyethylene glycol (MIRALAX) packet Take 17 g daily as needed by mouth for mild constipation. (Patient not taking: Reported on 09/01/2017) 14 each 0   No current facility-administered medications on file prior to visit.     No Known Allergies  Social History   Socioeconomic History  . Marital status: Married    Spouse name:  Not on file  . Number of children: Not on file  . Years of education: Not on file  . Highest education level: Not on file  Social Needs  . Financial resource strain: Not on file  . Food insecurity - worry: Not on file  . Food insecurity - inability: Not on file  . Transportation needs - medical: Not on file  . Transportation needs - non-medical: Not on file  Occupational History  . Not on file  Tobacco Use  . Smoking status: Former Smoker    Packs/day: 1.00    Years: 54.00    Pack years: 54.00    Types: Cigars, Cigarettes    Last attempt to quit: 02/16/2015    Years since quitting: 2.5  .  Smokeless tobacco: Former Systems developer    Types: Chew  . Tobacco comment: "chewed some when I was young"  Substance and Sexual Activity  . Alcohol use: No    Alcohol/week: 0.0 oz    Comment: 04/24/2015 "no beer in 2 wks; ;no liquor in 3-4 weeks; I'm not a real heavy drinker" states he stopped drining 2 yrs ago  . Drug use: No  . Sexual activity: No  Other Topics Concern  . Not on file  Social History Narrative  . Not on file    Family History  Problem Relation Age of Onset  . CVA Unknown   . Hypertension Unknown   . Diabetes Mother   . Hypertension Father   . Peripheral Artery Disease Father   . Alcohol abuse Father   . Heart attack Father     Past Surgical History:  Procedure Laterality Date  . CARDIAC CATHETERIZATION N/A 04/24/2015   Procedure: Left Heart Cath and Coronary Angiography;  Surgeon: [name], MD;  Location: [facility];  Service: Cardiovascular;  Laterality: N/A;  . CARDIAC CATHETERIZATION N/A 04/24/2015   Procedure: Coronary Stent Intervention;  Surgeon: [name], MD;  Location: [facility];  Service: Cardiovascular;  Laterality: N/A;  Prox RCA  . CARDIAC CATHETERIZATION Right 05/29/2015   Procedure: Angioplasty Perioneal and SFA;  Surgeon: [name], MD;  Location: [facility];  Service: Cardiovascular;  Laterality: Right;  . COLONOSCOPY Left 02/29/2016   Procedure: COLONOSCOPY;  Surgeon: Carol Ada, MD;  [facility];  Service: Endoscopy;  Laterality: Left;  . COLONOSCOPY WITH PROPOFOL N/A 11/23/2015   Procedure: COLONOSCOPY WITH PROPOFOL;  Surgeon: Carol Ada, MD;  [facility]: [facility];  Service: Endoscopy;  Laterality: N/A;  . CORONARY ANGIOPLASTY    . ESOPHAGOGASTRODUODENOSCOPY (EGD) WITH PROPOFOL N/A 11/23/2015   Procedure: ESOPHAGOGASTRODUODENOSCOPY (EGD) WITH PROPOFOL;  Surgeon: Carol Ada, MD;  [facility];  Service: Endoscopy;  Laterality: N/A;  . EXCISIONAL HEMORRHOIDECTOMY  1980's  . GIVENS CAPSULE STUDY N/A  02/29/2016   Procedure: GIVENS CAPSULE STUDY;  Surgeon: Carol Ada, MD;  [facility];  Service: Endoscopy;  Laterality: N/A;  . LAPAROSCOPIC SMALL BOWEL RESECTION N/A 06/30/2017   Procedure: LAPAROSCOPIC SMALL BOWEL RESECTION;  Surgeon: Leighton Ruff, MD;  [facility]: [facility];  Service: General;  Laterality: N/A;  . PERIPHERAL VASCULAR CATHETERIZATION N/A 04/24/2015   Procedure: Lower Extremity Angiography;  Surgeon: [name], MD;  Location: [place] [facility];  Service: Cardiovascular;  Laterality: N/A;  . PERIPHERAL VASCULAR CATHETERIZATION N/A 05/29/2015   Procedure: Lower Extremity Angiography;  Surgeon: [name], MD;  Location: [name];  Service: Cardiovascular;  Laterality: N/A;  . PERIPHERAL VASCULAR CATHETERIZATION Left 05/29/2015   Procedure: Renal  Angiography;  Surgeon: [name], MD;  Location: [place] [facility];  Service: Cardiovascular;  Laterality: Left;  . PERIPHERAL VASCULAR CATHETERIZATION Left 06/12/2015   Procedure: Peripheral Vascular Atherectomy;  Surgeon: [name], MD;  Location: [facility];  Service: Cardiovascular;  Laterality: Left;  AT  . PERIPHERAL VASCULAR CATHETERIZATION Left 06/12/2015   Procedure: Peripheral Vascular Intervention;  Surgeon: [name], MD;  Location: [facility];  Service: Cardiovascular;  Laterality: Left;  PTA TP TRUNK  . UPPER GI ENDOSCOPY  12/16/11   findings:  mild gastritis and duodenitis    ROS: Review of Systems  PHYSICAL EXAM: BP (!) 178/90   Pulse 85   Temp 98.2 F (36.8 C) (Oral)   Resp 16   Ht 5\' 8"  (1.727 m)   Wt 198 lb 6.4 oz (90 kg)   SpO2 98%   BMI 30.17 kg/m   Physical Exam  {male adult master:310786} {male adult master:310785}  Lab Results  Component Value Date   HGBA1C 8.6 (H) 06/26/2017    ASSESSMENT AND PLAN: There are no diagnoses linked to this encounter.  Patient was given the opportunity to ask questions.  Patient verbalized understanding of the plan and was  able to repeat key elements of the plan.   No orders of the defined types were placed in this encounter.    Requested Prescriptions    No prescriptions requested or ordered in this encounter    No Follow-up on file.  Karle Plumber, MD, FACP

## 2017-09-02 DIAGNOSIS — D3A8 Other benign neuroendocrine tumors: Secondary | ICD-10-CM | POA: Insufficient documentation

## 2017-09-02 DIAGNOSIS — D3A019 Benign carcinoid tumor of the small intestine, unspecified portion: Secondary | ICD-10-CM | POA: Insufficient documentation

## 2017-09-02 LAB — MICROALBUMIN / CREATININE URINE RATIO
Creatinine, Urine: 64.6 mg/dL
Microalb/Creat Ratio: <4.6 mg/g creat (ref 0.0–30.0)
Microalbumin, Urine: <3 ug/mL

## 2017-09-02 LAB — HEMATOCRIT: Hematocrit: 43.2 % (ref 37.5–51.0)

## 2017-09-02 NOTE — Progress Notes (Signed)
Patient ID: [name], male    DOB: Oct 01, 1945  MRN: [id number]  CC: New Patient (Initial Visit)   Subjective: [name] is a 72 y.o. male who presents for new pt visit.  No PCP since 2011.  His cardiologist Dr. Skeet Latch has been filling his meds His concerns today include:  Pt with hx of IDA due to occult small bowel bleeding, CAD with hx of PCI, PVD, CKD, HTN, HL, DM type 2  Pt hosp 11/9-17/2018 with symptomatic anemia requiring blood transfusion. Found to have mass in small bowel via CT imaging -had lap resection of small bowel. Pathology of mass revealed well differentiated neuroendocrine tumor.  He has seen the surgeon in f/u -good appetite.  No abdominal pain, black stools or dizziness -taking iron BID  2.  DM:  Compliant with Metformin Does okay with eating habits. Drinks mainly coffee and water Walks 30-45 mins most days Checks BS 1-2 x a day several days a wk but not recent  3. CAD/HTN: no CP/SOB/LE edema.  Compliant with meds but does not have Crestor or Lipitor.  Both were on med list.  HM: due for flu shot and Prevnar 13 Patient Active Problem List   Diagnosis Date Noted  . Mass of small intestine with bleeding ?angiodysplasia 06/28/2017  . Chronic anticoagulation 06/28/2017  . Abnormal CT scan, small bowel   . Iron deficiency anemia due to chronic blood loss 06/26/2017  . Symptomatic anemia   . DOE (dyspnea on exertion) 06/25/2017  . Acute diastolic CHF (congestive heart failure) ([place]) 06/25/2017  . Hypertensive cardiovascular disease 06/25/2017  . PVC's (premature ventricular contractions) 01/26/2017  . Pre-syncope 01/25/2017  . CAD S/P percutaneous coronary angioplasty 10/03/2016  . Anemia due to chronic blood loss 02/28/2016  . Chest pain 02/28/2016  . Chest pain with high risk for cardiac etiology 04/22/2015  . Claudication in peripheral vascular disease ([place]) 04/22/2015  . Anemia, iron deficiency 10/17/2014  . Essential hypertension  10/17/2014  . DM2 (diabetes mellitus, type 2) ([place]) 04/08/2012  . PUD (peptic ulcer disease) 11/11/2011     Current Outpatient Medications on File Prior to Visit  Medication Sig Dispense Refill  . amLODipine (NORVASC) 5 MG tablet Take 1 tablet (5 mg total) by mouth daily. 30 tablet 5  . aspirin 81 MG tablet Take 1 tablet (81 mg total) by mouth daily. 30 tablet 11  . bisoprolol (ZEBETA) 5 MG tablet Take 1 tablet (5 mg total) by mouth daily. 30 tablet 5  . chlorthalidone (HYGROTON) 25 MG tablet Take 1 tablet (25 mg total) by mouth daily. 30 tablet 5  . ferrous sulfate 325 (65 FE) MG EC tablet Take 1 tablet (325 mg total) by mouth 2 (two) times daily with a meal. 60 tablet 5  . glimepiride (AMARYL) 4 MG tablet Take 1 tablet (4 mg total) daily before breakfast by mouth. Additional refills from PCP 30 tablet 0  . isosorbide mononitrate (IMDUR) 30 MG 24 hr tablet Take 1 tablet (30 mg total) daily by mouth. 30 tablet 3  . atorvastatin (LIPITOR) 40 MG tablet Take 40 mg daily by mouth.    . polyethylene glycol (MIRALAX) packet Take 17 g daily as needed by mouth for mild constipation. (Patient not taking: Reported on 09/01/2017) 14 each 0   No current facility-administered medications on file prior to visit.     No Known Allergies  Social History   Socioeconomic History  . Marital status: Married    Spouse name: Not  on file  . Number of children: Not on file  . Years of education: Not on file  . Highest education level: Not on file  Social Needs  . Financial resource strain: Not on file  . Food insecurity - worry: Not on file  . Food insecurity - inability: Not on file  . Transportation needs - medical: Not on file  . Transportation needs - non-medical: Not on file  Occupational History  . Not on file  Tobacco Use  . Smoking status: Former Smoker    Packs/day: 1.00    Years: 54.00    Pack years: 54.00    Types: Cigars, Cigarettes    Last attempt to quit: 02/16/2015    Years since  quitting: 2.5  . Smokeless tobacco: Former Systems developer    Types: Chew  . Tobacco comment: "chewed some when I was young"  Substance and Sexual Activity  . Alcohol use: No    Alcohol/week: 0.0 oz    Comment: 04/24/2015 "no beer in 2 wks; ;no liquor in 3-4 weeks; I'm not a real heavy drinker" states he stopped drining 2 yrs ago  . Drug use: No  . Sexual activity: No  Other Topics Concern  . Not on file  Social History Narrative  . Not on file    Family History  Problem Relation Age of Onset  . CVA Unknown   . Hypertension Unknown   . Diabetes Mother   . Hypertension Father   . Peripheral Artery Disease Father   . Alcohol abuse Father   . Heart attack Father     Past Surgical History:  Procedure Laterality Date  . CARDIAC CATHETERIZATION N/A 04/24/2015   Procedure: Left Heart Cath and Coronary Angiography;  Surgeon: [name], MD;  Location: [facility];  Service: Cardiovascular;  Laterality: N/A;  . CARDIAC CATHETERIZATION N/A 04/24/2015   Procedure: Coronary Stent Intervention;  Surgeon: [name], MD;  Location: [place] [facility];  Service: Cardiovascular;  Laterality: N/A;  Prox RCA  . CARDIAC CATHETERIZATION Right 05/29/2015   Procedure: Angioplasty Perioneal and SFA;  Surgeon: [name], MD;  Location: [facility];  Service: Cardiovascular;  Laterality: Right;  . COLONOSCOPY Left 02/29/2016   Procedure: COLONOSCOPY;  Surgeon: Carol Ada, MD;  [facility];  Service: Endoscopy;  Laterality: Left;  . COLONOSCOPY WITH PROPOFOL N/A 11/23/2015   Procedure: COLONOSCOPY WITH PROPOFOL;  Surgeon: Carol Ada, MD;  [facility];  Service: Endoscopy;  Laterality: N/A;  . CORONARY ANGIOPLASTY    . ESOPHAGOGASTRODUODENOSCOPY (EGD) WITH PROPOFOL N/A 11/23/2015   Procedure: ESOPHAGOGASTRODUODENOSCOPY (EGD) WITH PROPOFOL;  Surgeon: Carol Ada, MD;  [facility];  Service: Endoscopy;  Laterality: N/A;  . EXCISIONAL HEMORRHOIDECTOMY  1980's  . GIVENS  CAPSULE STUDY N/A 02/29/2016   Procedure: GIVENS CAPSULE STUDY;  Surgeon: Carol Ada, MD;  [facility];  Service: Endoscopy;  Laterality: N/A;  . LAPAROSCOPIC SMALL BOWEL RESECTION N/A 06/30/2017   Procedure: LAPAROSCOPIC SMALL BOWEL RESECTION;  Surgeon: Leighton Ruff, MD;  [facility];  Service: General;  Laterality: N/A;  . PERIPHERAL VASCULAR CATHETERIZATION N/A 04/24/2015   Procedure: Lower Extremity Angiography;  Surgeon: [name], MD;  Location: [facility];  Service: Cardiovascular;  Laterality: N/A;  . PERIPHERAL VASCULAR CATHETERIZATION N/A 05/29/2015   Procedure: Lower Extremity Angiography;  Surgeon: [name], MD;  Location: [place];  Service: Cardiovascular;  Laterality: N/A;  . PERIPHERAL VASCULAR CATHETERIZATION Left 05/29/2015   Procedure: Renal Angiography;  Surgeon: [name], MD;  Location: [facility];  Service: Cardiovascular;  Laterality: Left;  . PERIPHERAL VASCULAR CATHETERIZATION Left 06/12/2015   Procedure: Peripheral Vascular Atherectomy;  Surgeon: [name], MD;  Location: [facility];  Service: Cardiovascular;  Laterality: Left;  AT  . PERIPHERAL VASCULAR CATHETERIZATION Left 06/12/2015   Procedure: Peripheral Vascular Intervention;  Surgeon: [name], MD;  Location: [facility] [facility];  Service: Cardiovascular;  Laterality: Left;  PTA TP TRUNK  . UPPER GI ENDOSCOPY  12/16/11   findings:  mild gastritis and duodenitis    ROS: Review of Systems  Constitutional: Negative for appetite change and fever.  Eyes: Negative for visual disturbance.  Respiratory: Negative for chest tightness and shortness of breath.   Cardiovascular: Negative for chest pain.  Genitourinary: Negative for difficulty urinating.  Neurological: Negative for dizziness.  Psychiatric/Behavioral: The patient is not nervous/anxious.     PHYSICAL EXAM: BP (!) 178/90   Pulse 85   Temp 98.2 F (36.8 C) (Oral)   Resp 16   Ht '5\' 8"'  (1.727 m)    Wt 198 lb 6.4 oz (90 kg)   SpO2 98%   BMI 30.17 kg/m   Physical Exam General appearance - alert, well appearing, and in no distress Mental status - alert, oriented to person, place, and time, normal mood, behavior, speech, dress, motor activity, and thought processes Eyes - pupils equal and reactive, extraocular eye movements intact Mouth - mucous membranes moist, pharynx normal without lesions Neck - supple, no significant adenopathy Chest - clear to auscultation, no wheezes, rales or rhonchi, symmetric air entry Heart - normal rate, regular rhythm, normal S1, S2, no murmurs, rubs, clicks or gallops Abdomen - soft, nontender, nondistended, no masses or organomegaly Neurological - actively moves all 4 ext.  Gait stable Extremities -no LE edema Diabetic Foot Exam - Simple   Simple Foot Form Visual Inspection No deformities, no ulcerations, no other skin breakdown bilaterally:  Yes Sensation Testing Intact to touch and monofilament testing bilaterally:  Yes Pulse Check Posterior Tibialis and Dorsalis pulse intact bilaterally:  Yes Comments    Results for orders placed or performed in visit on 09/01/17  Microalbumin / creatinine urine ratio  Result Value Ref Range   Creatinine, Urine 64.6 Not Estab. mg/dL   Albumin, Urine <3.0 Not Estab. ug/mL   Microalb/Creat Ratio <4.6 0.0 - 30.0 mg/g creat  Hematocrit  Result Value Ref Range   Hematocrit 43.2 37.5 - 51.0 %  POCT glucose (manual entry)  Result Value Ref Range   POC Glucose 191 (A) 70 - 99 mg/dl   Lab Results  Component Value Date   HGBA1C 8.6 (H) 06/26/2017   Lab Results  Component Value Date   WBC 7.2 07/04/2017   HGB 10.2 (L) 07/04/2017   HCT 43.2 09/01/2017   MCV 83.8 07/04/2017   PLT 228 07/04/2017     Chemistry      Component Value Date/Time   NA 137 07/04/2017 0600   NA 137 06/22/2017 0946   K 3.7 07/04/2017 0600   CL 105 07/04/2017 0600   CO2 24 07/04/2017 0600   BUN 15 07/04/2017 0600   BUN 19  06/22/2017 0946   CREATININE 1.14 07/04/2017 0600   CREATININE 1.35 (H) 10/31/2015 1559      Component Value Date/Time   CALCIUM 9.0 07/04/2017 0600   ALKPHOS 43 06/22/2017 0946   AST 10 06/22/2017 0946   ALT 7 06/22/2017 0946   BILITOT 0.3 06/22/2017 0946  ASSESSMENT AND PLAN: 1. Diabetes mellitus type 2, uncontrolled, with complications (HCC) Last eGFR>60.  Will increase Metformin  - POCT glucose (manual entry) - Microalbumin / creatinine urine ratio - metFORMIN (GLUCOPHAGE-XR) 500 MG 24 hr tablet; Take 1 tablet (500 mg total) by mouth 2 (two) times daily.  Dispense: 30 tablet; Refill: 6 - Ambulatory referral to Ophthalmology  2. Essential hypertension BP was repeated and was at goal. Continue current meds  3. Iron deficiency anemia due to chronic blood loss - Hematocrit  4. Benign neuroendocrine tumor of small intestine Pt reports being told by surgeon that this was a benign tumor and all of it was removed, but will still get him pulled in with GI  - Ambulatory referral to Gastroenterology  5. Coronary artery disease involving native coronary artery of native heart without angina pectoris - clopidogrel (PLAVIX) 75 MG tablet; Take 1 tablet (75 mg total) by mouth daily.  Dispense: 90 tablet; Refill: 3 - rosuvastatin (CRESTOR) 20 MG tablet; Take 1 tablet (20 mg total) by mouth daily.  Dispense: 30 tablet; Refill: 5  6. Need for Streptococcus pneumoniae and influenza vaccination - Pneumococcal conjugate vaccine 13-valent IM  7. Immunization due - Flu Vaccine QUAD 6+ mos PF IM (Fluarix Quad PF)   Patient was given the opportunity to ask questions.  Patient verbalized understanding of the plan and was able to repeat key elements of the plan.   Orders Placed This Encounter  Procedures  . Pneumococcal conjugate vaccine 13-valent IM  . Flu Vaccine QUAD 6+ mos PF IM (Fluarix Quad PF)  . Microalbumin / creatinine urine ratio  . Hematocrit  . Ambulatory referral to  Ophthalmology  . Ambulatory referral to Gastroenterology  . POCT glucose (manual entry)     Requested Prescriptions   Signed Prescriptions Disp Refills  . clopidogrel (PLAVIX) 75 MG tablet 90 tablet 3    Sig: Take 1 tablet (75 mg total) by mouth daily.  . metFORMIN (GLUCOPHAGE-XR) 500 MG 24 hr tablet 30 tablet 6    Sig: Take 1 tablet (500 mg total) by mouth 2 (two) times daily.  . rosuvastatin (CRESTOR) 20 MG tablet 30 tablet 5    Sig: Take 1 tablet (20 mg total) by mouth daily.    Return in about 3 months (around 11/30/2017).  Karle Plumber, MD, FACP

## 2017-09-18 ENCOUNTER — Ambulatory Visit (INDEPENDENT_AMBULATORY_CARE_PROVIDER_SITE_OTHER): Payer: Medicare HMO | Admitting: Cardiovascular Disease

## 2017-09-18 ENCOUNTER — Encounter: Payer: Self-pay | Admitting: Cardiovascular Disease

## 2017-09-18 VITALS — BP 146/84 | HR 67 | Ht 68.0 in | Wt 195.0 lb

## 2017-09-18 DIAGNOSIS — Z9861 Coronary angioplasty status: Secondary | ICD-10-CM

## 2017-09-18 DIAGNOSIS — E7800 Pure hypercholesterolemia, unspecified: Secondary | ICD-10-CM

## 2017-09-18 DIAGNOSIS — I2510 Atherosclerotic heart disease of native coronary artery without angina pectoris: Secondary | ICD-10-CM

## 2017-09-18 DIAGNOSIS — I739 Peripheral vascular disease, unspecified: Secondary | ICD-10-CM

## 2017-09-18 DIAGNOSIS — I10 Essential (primary) hypertension: Secondary | ICD-10-CM

## 2017-09-18 MED ORDER — AMLODIPINE BESYLATE 10 MG PO TABS: 10.0000 mg | ORAL_TABLET | Freq: Every day | ORAL | 5 refills | Status: DC

## 2017-09-18 NOTE — Progress Notes (Signed)
Cardiology Office Note     Date:  09/18/2017   ID:  [name], DOB 1945-09-24, MRN [id number]  PCP:  [name], MD  Cardiologist:   Skeet Latch, MD   No chief complaint on file.    History of Present Illness: [name] is a 72 y.o. male with CAD s/p LAD PCI, PAD s/p peripheral stenting, hypertension, poorly-controlled diabetes and non-compliance who presents for follow up.  He was first seen 09/2016. He was hospitalized 02/2016 with melena.   His hemoglobin was 7.1 at the time.  He declined work up and restarted Plavix on his own.  He underwent upper and lower endoscopy and was found to have diverticulosis but no source of bleeding.  No bleeding was found on capsule endoscopy at [facility].  He reports that an exploratory surgery was recommended but he declined.  He has decided to treat himself with increased fiber instead.  He ultimately restarted Plavix and denies recurrent bleeding.  Per noted with his PCP on 09/16/16 he admitted to missing several doses of clopidogrel.  However today he reports that he "never misses meds."   Per a letter from his daughter and talking with his wife he is very noncompliant with all medications. His most recent hemoblobin A1c is 10.2%.  Mr. [name] was previously a patient of Dr. [name]. He was instructed to follow up with Dr. [name] prior to restarting Plavix but never did.  [name] was referred for [facility] 10/24/16 that revealed LVEF 49% with no ischemia and global hypokinesis. He was referred for an echocardiogram which has not yet occurred. He also had ABIs that revealed greater than 50% right distal SFA stenosis status post angioplasty and 30-49% left mid to distal superficial SFA stenosis. One year follow-up was recommended.  At his last appointment [name] with noted to be taking his medications at different times of the day and sporadically.  Nebivolol was discontinued due to cost concerns and he was switched to bisoprolol and  hydrochlorothiazide.  He followed up with our pharmacist 03/2017 and his blood pressure was better-controlled.  Since his last appointment [name] has been doing well.  He denies chest pain or shortness of breath.  He tries to walk for exercise a couple times per week.  He has not been doing this lately because of the weather.  He denies any chest pain and his breathing has been stable.  He has not noted any lower extremity edema, orthopnea, or PND.  He gets tired after walking long distances.  He does not check his blood pressure often at home.On reviewing his medications he notes that he has not been taking any amlodipine as he thought this was discontinued.  He limits the salt in his diet  At his last appointment [name] blood pressure was elevated.  He was not taking amlodipine.  This was restarted and chlorthalidone was increased to 25 mg daily.  He followed up with his new PCP, Dr. Wynetta Emery, on 09/01/17.  His blood pressure was initially elevated but better on repeat.  Since his last appointment he was seen in the hospital 06/2017 with acute on chronic anemia and shortness of breath.  He was found to have a hemoglobin of 6 and required transfusion.  He underwent small bowel resection 06/30/17.  Pathology revealed a carcinoid tumor.  [name] reports that he has been feeling well.  His breathing has improved since his surgery.  He walks every day or every other day.  He has no chest pain or shortness of breath with this activity.  He denies any recurrent melena or hematochezia.  He checks his blood pressure intermittently and it usually is elevated, though he is not sure of the exact numbers.  He reports that he does not add any salt to his food but he eats out frequently.  He plans to go to the [employer] when he leaves here.  Past Medical History:  Diagnosis Date  . Angiodysplasia of small intestine 02/2016   terminal ileum  . Bleeding gastric ulcer 2000's  . Chronic anemia   . Coronary artery  disease   . Diverticulosis   . GERD (gastroesophageal reflux disease)   . GI bleeding   . History of blood transfusion 2000's   "related to bleeding gastric ulcer"  . Hyperlipidemia   . Hypertension   . Non-compliance   . PVD (peripheral vascular disease) ([place])   . S/P coronary artery stent placement 10/03/2016  . Type II diabetes mellitus ([place])     Past Surgical History:  Procedure Laterality Date  . CARDIAC CATHETERIZATION N/A 04/24/2015   Procedure: Left Heart Cath and Coronary Angiography;  Surgeon: [name], MD;  Location: [place] [facility];  Service: Cardiovascular;  Laterality: N/A;  . CARDIAC CATHETERIZATION N/A 04/24/2015   Procedure: Coronary Stent Intervention;  Surgeon: [name], MD;  Location: [place] [facility];  Service: Cardiovascular;  Laterality: N/A;  Prox RCA  . CARDIAC CATHETERIZATION Right 05/29/2015   Procedure: Angioplasty Perioneal and SFA;  Surgeon: [name], MD;  Location: [place] [facility];  Service: Cardiovascular;  Laterality: Right;  . COLONOSCOPY Left 02/29/2016   Procedure: COLONOSCOPY;  Surgeon: Carol Ada, MD;  Location: [facility];  Service: Endoscopy;  Laterality: Left;  . COLONOSCOPY WITH PROPOFOL N/A 11/23/2015   Procedure: COLONOSCOPY WITH PROPOFOL;  Surgeon: Carol Ada, MD;  Location: [facility];  Service: Endoscopy;  Laterality: N/A;  . CORONARY ANGIOPLASTY    . ESOPHAGOGASTRODUODENOSCOPY (EGD) WITH PROPOFOL N/A 11/23/2015   Procedure: ESOPHAGOGASTRODUODENOSCOPY (EGD) WITH PROPOFOL;  Surgeon: Carol Ada, MD;  [facility]: [facility];  Service: Endoscopy;  Laterality: N/A;  . EXCISIONAL HEMORRHOIDECTOMY  1980's  . GIVENS CAPSULE STUDY N/A 02/29/2016   Procedure: GIVENS CAPSULE STUDY;  Surgeon: Carol Ada, MD;  Location: [facility];  Service: Endoscopy;  Laterality: N/A;  . LAPAROSCOPIC SMALL BOWEL RESECTION N/A 06/30/2017   Procedure: LAPAROSCOPIC SMALL BOWEL RESECTION;  Surgeon: Leighton Ruff, MD;  Location: [facility];  Service: General;   Laterality: N/A;  . PERIPHERAL VASCULAR CATHETERIZATION N/A 04/24/2015   Procedure: Lower Extremity Angiography;  Surgeon: [name], MD;  Location: [place] [facility];  Service: Cardiovascular;  Laterality: N/A;  . PERIPHERAL VASCULAR CATHETERIZATION N/A 05/29/2015   Procedure: Lower Extremity Angiography;  Surgeon: [name], MD;  Location: [place] [facility];  Service: Cardiovascular;  Laterality: N/A;  . PERIPHERAL VASCULAR CATHETERIZATION Left 05/29/2015   Procedure: Renal Angiography;  Surgeon: [name], MD;  Location: [place] [facility];  Service: Cardiovascular;  Laterality: Left;  . PERIPHERAL VASCULAR CATHETERIZATION Left 06/12/2015   Procedure: Peripheral Vascular Atherectomy;  Surgeon: [name], MD;  Location: [place] [facility];  Service: Cardiovascular;  Laterality: Left;  AT  . PERIPHERAL VASCULAR CATHETERIZATION Left 06/12/2015   Procedure: Peripheral Vascular Intervention;  Surgeon: [name], MD;  Location: [place] [facility];  Service: Cardiovascular;  Laterality: Left;  PTA TP TRUNK  . UPPER GI ENDOSCOPY  12/16/11   findings:  mild gastritis and duodenitis  Current Outpatient Medications  Medication Sig Dispense Refill  . aspirin 81 MG tablet Take 1 tablet (81 mg total) by mouth daily. 30 tablet 11  . atorvastatin (LIPITOR) 40 MG tablet Take 40 mg daily by mouth.    . bisoprolol (ZEBETA) 5 MG tablet Take 1 tablet (5 mg total) by mouth daily. 30 tablet 5  . chlorthalidone (HYGROTON) 25 MG tablet Take 1 tablet (25 mg total) by mouth daily. 30 tablet 5  . clopidogrel (PLAVIX) 75 MG tablet Take 1 tablet (75 mg total) by mouth daily. 90 tablet 3  . ferrous sulfate 325 (65 FE) MG EC tablet Take 1 tablet (325 mg total) by mouth 2 (two) times daily with a meal. 60 tablet 5  . glimepiride (AMARYL) 4 MG tablet Take 1 tablet (4 mg total) daily before breakfast by mouth. Additional refills from PCP 30 tablet 0  . isosorbide mononitrate (IMDUR) 30 MG 24 hr tablet Take 1 tablet (30  mg total) daily by mouth. 30 tablet 3  . metFORMIN (GLUCOPHAGE-XR) 500 MG 24 hr tablet Take 1 tablet (500 mg total) by mouth 2 (two) times daily. 30 tablet 6  . polyethylene glycol (MIRALAX) packet Take 17 g daily as needed by mouth for mild constipation. 14 each 0  . rosuvastatin (CRESTOR) 20 MG tablet Take 1 tablet (20 mg total) by mouth daily. 30 tablet 5  . amLODipine (NORVASC) 10 MG tablet Take 1 tablet (10 mg total) by mouth daily. 30 tablet 5   No current facility-administered medications for this visit.     Allergies:   Patient has no known allergies.    Social History:  The patient  reports that he quit smoking about 2 years ago. His smoking use included cigars and cigarettes. He has a 54.00 pack-year smoking history. He has quit using smokeless tobacco. His smokeless tobacco use included chew. He reports that he does not drink alcohol or use drugs.   Family History:  The patient's family history includes Alcohol abuse in his father; CVA in his unknown relative; Diabetes in his mother; Heart attack in his father; Hypertension in his father and unknown relative; Peripheral Artery Disease in his father.    ROS:  Please see the history of present illness.   Otherwise, review of systems are positive for none.   All other systems are reviewed and negative.    PHYSICAL EXAM: VS:  BP (!) 146/84   Pulse 67   Ht 5\' 8"  (1.727 m)   Wt 195 lb (88.5 kg)   BMI 29.65 kg/m  , BMI Body mass index is 29.65 kg/m. GENERAL:  Well appearing HEENT: Pupils equal round and reactive, fundi not visualized, oral mucosa unremarkable NECK:  No jugular venous distention, waveform within normal limits, carotid upstroke brisk and symmetric, no bruits, no thyromegaly LUNGS:  Clear to auscultation bilaterally HEART:  RRR.  PMI not displaced or sustained,S1 and S2 within normal limits, no S3, no S4, no clicks, no rubs, no murmurs ABD:  Flat, positive bowel sounds normal in frequency in pitch, no bruits, no  rebound, no guarding, no midline pulsatile mass, no hepatomegaly, no splenomegaly EXT:  2 plus pulses throughout, no edema, no cyanosis no clubbing SKIN:  No rashes no nodules NEURO:  Cranial nerves II through XII grossly intact, motor grossly intact throughout PSYCH:  Cognitively intact, oriented to person place and time    EKG:  EKG is ordered today. 09/18/17: Sinus rhythm.  Rate 67 bpm.  Cannot rule out prior  septal infarct.  Echo 6/27/8: Study Conclusions  - Left ventricle: The cavity size was normal. Wall thickness was   increased in a pattern of moderate LVH. Systolic function was   normal. The estimated ejection fraction was in the range of 60%   to 65%. Wall motion was normal; there were no regional wall   motion abnormalities. Doppler parameters are consistent with   abnormal left ventricular relaxation (grade 1 diastolic   dysfunction). - Left atrium: The atrium was mildly dilated. - Atrial septum: Mobile atrial septum no obvoius PFO consider f/u   bubble study if clinically indicated.  Lexiscan Myoview 10/24/16: The left ventricular ejection fraction is mildly decreased (45-54%).  Nuclear stress EF: 49%.  There was no ST segment deviation noted during stress.  The study is normal.  This is a low risk study.   ABI 10/21/16:  >50% right distal superficial femoral artery stenosis, s/p angioplasty. 30-49% left mid to distal superficial femoral artery stenosis. Three vessel run-off, bilaterally.   Recent Labs: 06/22/2017: ALT 7 06/26/2017: B Natriuretic Peptide 14.4 07/04/2017: BUN 15; Creatinine, Ser 1.14; Hemoglobin 10.2; Magnesium 1.9; Platelets 228; Potassium 3.7; Sodium 137    Lipid Panel    Component Value Date/Time   CHOL 143 06/22/2017 0946   TRIG 126 06/22/2017 0946   HDL 37 (L) 06/22/2017 0946   CHOLHDL 3.9 06/22/2017 0946   CHOLHDL 3.7 10/31/2015 1559   VLDL 27 10/31/2015 1559   LDLCALC 81 06/22/2017 0946      Wt Readings from Last 3 Encounters:   09/18/17 195 lb (88.5 kg)  09/01/17 198 lb 6.4 oz (90 kg)  06/26/17 198 lb (89.8 kg)     ASSESSMENT AND PLAN:  # Hypertension:  Blood pressure remains above goal both here and at home.  Increase amlodipine to 10 mg daily.  Continue bisoprolol, chlorthalidone, and isosorbide mononitrate.  # CAD s/p LAD PCI:  # Hyperlipidemia: Stable.  Stress test 10/2016 was negative for ischemia.  Continue aspirin, bisoprolol, Imdur, and rosuvastatin 20 mg daily.  Clopidogrel not necessary given stent >1 year ago.   # Leg numbness: # PAD: Mild PAD on the L and moderate on the R.  Repeat ABIs 10/2017.  Continue rosuvastatin and aspirin.  Plavix was restarted after resection of his carcinoid tumor.     Current medicines are reviewed at length with the patient today.  The patient does not have concerns regarding medicines.  The following changes have been made: Increase amlodipine to 10mg    Labs/ tests ordered today include:   No orders of the defined types were placed in this encounter.    Disposition:   FU with Ermalee Mealy C. Oval Linsey, MD, [facility] in 2 months.    This note was written with the assistance of speech recognition software.  Please excuse any transcriptional errors.  Signed, Rafiel Mecca C. Oval Linsey, MD, [facility]  09/18/2017 1:36 PM    [place]

## 2017-09-18 NOTE — Patient Instructions (Signed)
Medication Instructions:  INCREASE YOUR AMLODIPINE TO 10 MG DAILY   Labwork: NONE  Testing/Procedures: NONE  Follow-Up: Your physician recommends that you schedule a follow-up appointment in: 2 MONTH OV  If you need a refill on your cardiac medications before your next appointment, please call your pharmacy.

## 2017-11-18 ENCOUNTER — Ambulatory Visit: Payer: Medicare HMO | Admitting: Cardiovascular Disease

## 2017-11-18 ENCOUNTER — Encounter: Payer: Self-pay | Admitting: Cardiovascular Disease

## 2017-11-18 VITALS — BP 135/80 | HR 70 | Ht 68.0 in | Wt 194.0 lb

## 2017-11-18 DIAGNOSIS — I739 Peripheral vascular disease, unspecified: Secondary | ICD-10-CM

## 2017-11-18 DIAGNOSIS — Z9861 Coronary angioplasty status: Secondary | ICD-10-CM

## 2017-11-18 DIAGNOSIS — I2510 Atherosclerotic heart disease of native coronary artery without angina pectoris: Secondary | ICD-10-CM | POA: Diagnosis not present

## 2017-11-18 DIAGNOSIS — I10 Essential (primary) hypertension: Secondary | ICD-10-CM | POA: Diagnosis not present

## 2017-11-18 NOTE — Progress Notes (Signed)
Cardiology Office Note     Date:  11/18/2017   ID:  [name], DOB [birth year], MRN [id number]  PCP:  [name], MD  Cardiologist:   Skeet Latch, MD   No chief complaint on file.    History of Present Illness: [name] is a 72 y.o. male with CAD s/p LAD PCI, PAD s/p peripheral stenting, hypertension, poorly-controlled diabetes and non-compliance who presents for follow up.  He was first seen 09/2016. He was hospitalized 02/2016 with melena.   His hemoglobin was 7.1 at the time.  He declined work up and restarted Plavix on his own.  He underwent upper and lower endoscopy and was found to have diverticulosis but no source of bleeding.  No bleeding was found on capsule endoscopy at [facility].  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  Per a letter from his daughter and talking with his wife he is very noncompliant with all medications.  [name] was referred for [facility] 10/24/16 that revealed LVEF 49% with no ischemia and global hypokinesis. He was referred for an echocardiogram which has not yet occurred. He also had ABIs that revealed greater than 50% right distal SFA stenosis status post angioplasty and 30-49% left mid to distal superficial SFA stenosis. One year follow-up was recommended.  At his last appointment amlodipine was increased due to poorly-controlled hypertension.  He sometimes checks his BP at home and it has been "good."   He is unsure of the exact numbers.  He has no chest pain or shortness of breath.  He also denies lower extremity edema, orthopnea or PND.  He likes to play golf and has no exertional symptoms.  No notes occasional spasms in his L chest but not with exertion.  He has burning in his legs at night but no claudication or pain.  He denies orthopnea or PND.    Past Medical History:  Diagnosis Date  . Angiodysplasia of small intestine 02/2016   terminal ileum  . Bleeding gastric ulcer 2000's  .  Chronic anemia   . Coronary artery disease   . Diverticulosis   . GERD (gastroesophageal reflux disease)   . GI bleeding   . History of blood transfusion 2000's   "related to bleeding gastric ulcer"  . Hyperlipidemia   . Hypertension   . Non-compliance   . PVD (peripheral vascular disease) ([place])   . S/P coronary artery stent placement 10/03/2016  . Type II diabetes mellitus ([place])     Past Surgical History:  Procedure Laterality Date  . CARDIAC CATHETERIZATION N/A 04/24/2015   Procedure: Left Heart Cath and Coronary Angiography;  Surgeon: [name], MD;  Location: [place] [facility];  Service: Cardiovascular;  Laterality: N/A;  . CARDIAC CATHETERIZATION N/A 04/24/2015   Procedure: Coronary Stent Intervention;  Surgeon: [name], MD;  Location: [place] [facility];  Service: Cardiovascular;  Laterality: N/A;  Prox RCA  . CARDIAC CATHETERIZATION Right 05/29/2015   Procedure: Angioplasty Perioneal and SFA;  Surgeon: [name], MD;  Location: [place] [facility];  Service: Cardiovascular;  Laterality: Right;  . COLONOSCOPY Left 02/29/2016   Procedure: COLONOSCOPY;  Surgeon: Carol Ada, MD;  [facility];  Service: Endoscopy;  Laterality: Left;  . COLONOSCOPY WITH PROPOFOL N/A 11/23/2015   Procedure: COLONOSCOPY WITH PROPOFOL;  Surgeon: Carol Ada, MD;  [facility];  Service: Endoscopy;  Laterality: N/A;  . CORONARY ANGIOPLASTY    . ESOPHAGOGASTRODUODENOSCOPY (EGD) WITH PROPOFOL N/A 11/23/2015  Procedure: ESOPHAGOGASTRODUODENOSCOPY (EGD) WITH PROPOFOL;  Surgeon: Carol Ada, MD;  [facility];  Service: Endoscopy;  Laterality: N/A;  . EXCISIONAL HEMORRHOIDECTOMY  1980's  . GIVENS CAPSULE STUDY N/A 02/29/2016   Procedure: GIVENS CAPSULE STUDY;  Surgeon: Carol Ada, MD;  [facility];  Service: Endoscopy;  Laterality: N/A;  . LAPAROSCOPIC SMALL BOWEL RESECTION N/A 06/30/2017   Procedure: LAPAROSCOPIC SMALL BOWEL RESECTION;  Surgeon: Leighton Ruff, MD;   [facility];  Service: General;  Laterality: N/A;  . PERIPHERAL VASCULAR CATHETERIZATION N/A 04/24/2015   Procedure: Lower Extremity Angiography;  Surgeon: [name], MD;  Location: [place] [facility];  Service: Cardiovascular;  Laterality: N/A;  . PERIPHERAL VASCULAR CATHETERIZATION N/A 05/29/2015   Procedure: Lower Extremity Angiography;  Surgeon: [name], MD;  Location: [place] [facility];  Service: Cardiovascular;  Laterality: N/A;  . PERIPHERAL VASCULAR CATHETERIZATION Left 05/29/2015   Procedure: Renal Angiography;  Surgeon: [name], MD;  Location: [place] [facility];  Service: Cardiovascular;  Laterality: Left;  . PERIPHERAL VASCULAR CATHETERIZATION Left 06/12/2015   Procedure: Peripheral Vascular Atherectomy;  Surgeon: [name], MD;  Location: [place] [facility];  Service: Cardiovascular;  Laterality: Left;  AT  . PERIPHERAL VASCULAR CATHETERIZATION Left 06/12/2015   Procedure: Peripheral Vascular Intervention;  Surgeon: [name], MD;  Location: [place] [facility];  Service: Cardiovascular;  Laterality: Left;  PTA TP TRUNK  . UPPER GI ENDOSCOPY  12/16/11   findings:  mild gastritis and duodenitis     Current Outpatient Medications  Medication Sig Dispense Refill  . amLODipine (NORVASC) 10 MG tablet Take 1 tablet (10 mg total) by mouth daily. 30 tablet 5  . aspirin 81 MG tablet Take 1 tablet (81 mg total) by mouth daily. 30 tablet 11  . atorvastatin (LIPITOR) 40 MG tablet Take 40 mg daily by mouth.    . bisoprolol (ZEBETA) 10 MG tablet Take 10 mg by mouth daily.    . chlorthalidone (HYGROTON) 25 MG tablet Take 1 tablet (25 mg total) by mouth daily. 30 tablet 5  . clopidogrel (PLAVIX) 75 MG tablet Take 1 tablet (75 mg total) by mouth daily. 90 tablet 3  . ferrous sulfate 325 (65 FE) MG EC tablet Take 1 tablet (325 mg total) by mouth 2 (two) times daily with a meal. (Patient taking differently: Take 325 mg by mouth daily with breakfast. ) 60 tablet 5  . glimepiride (AMARYL) 4  MG tablet Take 1 tablet (4 mg total) daily before breakfast by mouth. Additional refills from PCP 30 tablet 0  . isosorbide mononitrate (IMDUR) 30 MG 24 hr tablet Take 1 tablet (30 mg total) daily by mouth. 30 tablet 3  . metFORMIN (GLUCOPHAGE-XR) 500 MG 24 hr tablet Take 1 tablet (500 mg total) by mouth 2 (two) times daily. 30 tablet 6  . polyethylene glycol (MIRALAX) packet Take 17 g daily as needed by mouth for mild constipation. 14 each 0  . rosuvastatin (CRESTOR) 20 MG tablet Take 1 tablet (20 mg total) by mouth daily. 30 tablet 5   No current facility-administered medications for this visit.     Allergies:   Patient has no known allergies.    Social History:  The patient  reports that he quit smoking about 2 years ago. His smoking use included cigars and cigarettes. He has a 54.00 pack-year smoking history. He has quit using smokeless tobacco. His smokeless tobacco use included chew. He reports that he does not drink alcohol or use drugs.  Family History:  The patient's family history includes Alcohol abuse in his father; CVA in his unknown relative; Diabetes in his mother; Heart attack in his father; Hypertension in his father and unknown relative; Peripheral Artery Disease in his father.    ROS:  Please see the history of present illness.   Otherwise, review of systems are positive for none.   All other systems are reviewed and negative.    PHYSICAL EXAM: VS:  BP 135/80   Pulse 70   Ht 5\' 8"  (1.727 m)   Wt 194 lb (88 kg)   BMI 29.50 kg/m  , BMI Body mass index is 29.5 kg/m. GENERAL:  Well appearing HEENT: Pupils equal round and reactive, fundi not visualized, oral mucosa unremarkable NECK:  No jugular venous distention, waveform within normal limits, carotid upstroke brisk and symmetric, no bruits LUNGS:  Clear to auscultation bilaterally HEART:  RRR.  PMI not displaced or sustained,S1 and S2 within normal limits, no S3, no S4, no clicks, no rubs, no murmurs ABD:  Flat,  positive bowel sounds normal in frequency in pitch, no bruits, no rebound, no guarding, no midline pulsatile mass, no hepatomegaly, no splenomegaly EXT:  2 plus pulses throughout, no edema, no cyanosis no clubbing SKIN:  No rashes no nodules NEURO:  Cranial nerves II through XII grossly intact, motor grossly intact throughout PSYCH:  Cognitively intact, oriented to person place and time   EKG:  EKG is not ordered today. 09/18/17: Sinus rhythm.  Rate 67 bpm.  Cannot rule out prior septal infarct.  Echo 6/27/8: Study Conclusions  - Left ventricle: The cavity size was normal. Wall thickness was   increased in a pattern of moderate LVH. Systolic function was   normal. The estimated ejection fraction was in the range of 60%   to 65%. Wall motion was normal; there were no regional wall   motion abnormalities. Doppler parameters are consistent with   abnormal left ventricular relaxation (grade 1 diastolic   dysfunction). - Left atrium: The atrium was mildly dilated. - Atrial septum: Mobile atrial septum no obvoius PFO consider f/u   bubble study if clinically indicated.  Lexiscan Myoview 10/24/16: The left ventricular ejection fraction is mildly decreased (45-54%).  Nuclear stress EF: 49%.  There was no ST segment deviation noted during stress.  The study is normal.  This is a low risk study.   ABI 10/21/16:  >50% right distal superficial femoral artery stenosis, s/p angioplasty. 30-49% left mid to distal superficial femoral artery stenosis. Three vessel run-off, bilaterally.   Recent Labs: 06/22/2017: ALT 7 06/26/2017: B Natriuretic Peptide 14.4 07/04/2017: BUN 15; Creatinine, Ser 1.14; Hemoglobin 10.2; Magnesium 1.9; Platelets 228; Potassium 3.7; Sodium 137    Lipid Panel    Component Value Date/Time   CHOL 143 06/22/2017 0946   TRIG 126 06/22/2017 0946   HDL 37 (L) 06/22/2017 0946   CHOLHDL 3.9 06/22/2017 0946   CHOLHDL 3.7 10/31/2015 1559   VLDL 27 10/31/2015 1559    LDLCALC 81 06/22/2017 0946      Wt Readings from Last 3 Encounters:  11/18/17 194 lb (88 kg)  09/18/17 195 lb (88.5 kg)  09/01/17 198 lb 6.4 oz (90 kg)     ASSESSMENT AND PLAN:  # Hypertension:  Blood pressure is doing much better.  Continue amlodipine, bisoprolol, chlorthalidone and Imdur.  # CAD s/p LAD PCI:  # Hyperlipidemia: No chest pain. Stress test 10/2016 was negative for ischemia.  Continue aspirin, bisoprolol, Imdur, and rosuvastatin 20 mg  daily.  Clopidogrel not necessary given stent >1 year ago.   # Leg numbness: # PAD: Mild PAD on the L and moderate on the R.  Repeat ABIs.  Continue rosuvastatin and aspirin.  Plavix was restarted after resection of his carcinoid tumor.     Current medicines are reviewed at length with the patient today.  The patient does not have concerns regarding medicines.  The following changes have been made: Increase amlodipine to 10mg    Labs/ tests ordered today include:   No orders of the defined types were placed in this encounter.    Disposition:   FU with Farhana Fellows C. Oval Linsey, MD, [facility] in 6 months.    This note was written with the assistance of speech recognition software.  Please excuse any transcriptional errors.  Signed, Gao Mitnick C. Oval Linsey, MD, [facility]  11/18/2017 1:42 PM    [facility]

## 2017-11-18 NOTE — Patient Instructions (Addendum)
Medication Instructions:  Your physician recommends that you continue on your current medications as directed. Please refer to the Current Medication list given to you today.  Labwork: none  Testing/Procedures: Your physician has requested that you have an ankle brachial index (ABI). During this test an ultrasound and blood pressure cuff are used to evaluate the arteries that supply the arms and legs with blood. Allow thirty minutes for this exam. There are no restrictions or special instructions.  Follow-Up: Your physician wants you to follow-up in: 6 month ov  You will receive a reminder letter in the mail two months in advance. If you don't receive a letter, please call our office to schedule the follow-up appointment.  If you need a refill on your cardiac medications before your next appointment, please call your pharmacy.

## 2017-12-01 ENCOUNTER — Encounter: Payer: Self-pay | Admitting: Internal Medicine

## 2017-12-01 ENCOUNTER — Ambulatory Visit: Payer: Medicare HMO | Attending: Internal Medicine | Admitting: Internal Medicine

## 2017-12-01 VITALS — BP 144/80 | HR 72 | Temp 98.0°F | Resp 16 | Wt 197.0 lb

## 2017-12-01 DIAGNOSIS — I10 Essential (primary) hypertension: Secondary | ICD-10-CM

## 2017-12-01 DIAGNOSIS — D500 Iron deficiency anemia secondary to blood loss (chronic): Secondary | ICD-10-CM | POA: Diagnosis not present

## 2017-12-01 DIAGNOSIS — Z79899 Other long term (current) drug therapy: Secondary | ICD-10-CM | POA: Insufficient documentation

## 2017-12-01 DIAGNOSIS — E1151 Type 2 diabetes mellitus with diabetic peripheral angiopathy without gangrene: Secondary | ICD-10-CM | POA: Insufficient documentation

## 2017-12-01 DIAGNOSIS — I493 Ventricular premature depolarization: Secondary | ICD-10-CM | POA: Diagnosis not present

## 2017-12-01 DIAGNOSIS — Z7982 Long term (current) use of aspirin: Secondary | ICD-10-CM | POA: Diagnosis not present

## 2017-12-01 DIAGNOSIS — Z7901 Long term (current) use of anticoagulants: Secondary | ICD-10-CM | POA: Insufficient documentation

## 2017-12-01 DIAGNOSIS — Z7984 Long term (current) use of oral hypoglycemic drugs: Secondary | ICD-10-CM | POA: Diagnosis not present

## 2017-12-01 DIAGNOSIS — K5900 Constipation, unspecified: Secondary | ICD-10-CM | POA: Diagnosis not present

## 2017-12-01 DIAGNOSIS — I2510 Atherosclerotic heart disease of native coronary artery without angina pectoris: Secondary | ICD-10-CM | POA: Insufficient documentation

## 2017-12-01 DIAGNOSIS — E1165 Type 2 diabetes mellitus with hyperglycemia: Secondary | ICD-10-CM | POA: Insufficient documentation

## 2017-12-01 DIAGNOSIS — K922 Gastrointestinal hemorrhage, unspecified: Secondary | ICD-10-CM | POA: Insufficient documentation

## 2017-12-01 DIAGNOSIS — E118 Type 2 diabetes mellitus with unspecified complications: Secondary | ICD-10-CM | POA: Diagnosis not present

## 2017-12-01 DIAGNOSIS — Z87891 Personal history of nicotine dependence: Secondary | ICD-10-CM | POA: Insufficient documentation

## 2017-12-01 DIAGNOSIS — I1310 Hypertensive heart and chronic kidney disease without heart failure, with stage 1 through stage 4 chronic kidney disease, or unspecified chronic kidney disease: Secondary | ICD-10-CM | POA: Insufficient documentation

## 2017-12-01 DIAGNOSIS — IMO0002 Reserved for concepts with insufficient information to code with codable children: Secondary | ICD-10-CM

## 2017-12-01 DIAGNOSIS — Z823 Family history of stroke: Secondary | ICD-10-CM | POA: Diagnosis not present

## 2017-12-01 LAB — GLUCOSE, POCT (MANUAL RESULT ENTRY): POC Glucose: 202 mg/dl — AB (ref 70–99)

## 2017-12-01 LAB — POCT GLYCOSYLATED HEMOGLOBIN (HGB A1C): Hemoglobin A1C: 9.2

## 2017-12-01 MED ORDER — METFORMIN HCL ER 500 MG PO TB24: 1000.0000 mg | ORAL_TABLET | Freq: Two times a day (BID) | ORAL | 6 refills | Status: DC

## 2017-12-01 NOTE — Patient Instructions (Signed)
Increase metformin to 1000 mg twice a day.  Follow a Healthy Eating Plan - You can do it! Limit sugary drinks.  Avoid sodas, sweet tea, sport or energy drinks, or fruit drinks.  Drink water, lo-fat milk, or diet drinks. Limit snack foods.   Cut back on candy, cake, cookies, chips, ice cream.  These are a special treat, only in small amounts. Eat plenty of vegetables.  Especially dark green, red, and orange vegetables. Aim for at least 3 servings a day. More is better! Include fruit in your daily diet.  Whole fruit is much healthier than fruit juice! Limit "white" bread, "white" pasta, "white" rice.   Choose "100% whole grain" products, brown or wild rice. Avoid fatty meats. Try "Meatless Monday" and choose eggs or beans one day a week.  When eating meat, choose lean meats like chicken, [place], and fish.  Grill, broil, or bake meats instead of frying, and eat poultry without the skin. Eat less salt.  Avoid frozen pizzas, frozen dinners and salty foods.  Use seasonings other than salt in cooking.  This can help blood pressure and keep you from swelling Beer, wine and liquor have calories.  If you can safely drink alcohol, limit to 1 drink per day for women, 2 drinks for men

## 2017-12-01 NOTE — Progress Notes (Signed)
Patient ID: [name], male    DOB: [birth year]  MRN: [id number]  CC: Follow-up   Subjective: [name] is a 72 y.o. male who presents for His concerns today include:  Pt with hx of IDA due to occult small bowel bleeding with resection of benign neuroendocrine tumor  06/2017, CAD with hx of PCI, PVD, CKD, HTN, HL, DM type 2  1.  CAD/HTN: checks BP daily and has log SBP 168-180 recently.  DBP in the 70s.  He tries to limit salt in the foods but feels that salt is just about anything to eat. Denies any chest pains or shortness of breath. Compliant with blood pressure and heart medications including Zebeta, amlodipine, chlorthalidone, and isosorbide  2.  IDA:  Takes iron QOD for the past 1 mth instead BID because of constipation  -no blood in stools but black stools from iron  3.  DM:  Compliant with Amaryl and Metformin. Checks BS occasionally.  Gives range 90-100s Feels he does okay with eating habits.  Stays active by walking several days a week.    Patient Active Problem List   Diagnosis Date Noted  . Benign neuroendocrine tumor of small intestine 09/02/2017  . Chronic anticoagulation 06/28/2017  . Iron deficiency anemia due to chronic blood loss 06/26/2017  . Hypertensive cardiovascular disease 06/25/2017  . PVC's (premature ventricular contractions) 01/26/2017  . CAD S/P percutaneous coronary angioplasty 10/03/2016  . Anemia due to chronic blood loss 02/28/2016  . Chest pain with high risk for cardiac etiology 04/22/2015  . Claudication in peripheral vascular disease ([place]) 04/22/2015  . Anemia, iron deficiency 10/17/2014  . Essential hypertension 10/17/2014  . DM2 (diabetes mellitus, type 2) ([place]) 04/08/2012  . PUD (peptic ulcer disease) 11/11/2011     Current Outpatient Medications on File Prior to Visit  Medication Sig Dispense Refill  . amLODipine (NORVASC) 10 MG tablet Take 1 tablet (10 mg total) by mouth daily. 30 tablet 5  . aspirin 81 MG tablet Take 1  tablet (81 mg total) by mouth daily. 30 tablet 11  . atorvastatin (LIPITOR) 40 MG tablet Take 40 mg daily by mouth.    . bisoprolol (ZEBETA) 10 MG tablet Take 10 mg by mouth daily.    . chlorthalidone (HYGROTON) 25 MG tablet Take 1 tablet (25 mg total) by mouth daily. 30 tablet 5  . clopidogrel (PLAVIX) 75 MG tablet Take 1 tablet (75 mg total) by mouth daily. 90 tablet 3  . ferrous sulfate 325 (65 FE) MG EC tablet Take 1 tablet (325 mg total) by mouth 2 (two) times daily with a meal. (Patient taking differently: Take 325 mg by mouth daily with breakfast. ) 60 tablet 5  . glimepiride (AMARYL) 4 MG tablet Take 1 tablet (4 mg total) daily before breakfast by mouth. Additional refills from PCP 30 tablet 0  . isosorbide mononitrate (IMDUR) 30 MG 24 hr tablet Take 1 tablet (30 mg total) daily by mouth. 30 tablet 3  . polyethylene glycol (MIRALAX) packet Take 17 g daily as needed by mouth for mild constipation. 14 each 0  . rosuvastatin (CRESTOR) 20 MG tablet Take 1 tablet (20 mg total) by mouth daily. 30 tablet 5   No current facility-administered medications on file prior to visit.     No Known Allergies  Social History   Socioeconomic History  . Marital status: Married    Spouse name: Not on file  . Number of children: Not on file  . Years of education:  Not on file  . Highest education level: Not on file  Occupational History  . Not on file  Social Needs  . Financial resource strain: Not on file  . Food insecurity:    Worry: Not on file    Inability: Not on file  . Transportation needs:    Medical: Not on file    Non-medical: Not on file  Tobacco Use  . Smoking status: Former Smoker    Packs/day: 1.00    Years: 54.00    Pack years: 54.00    Types: Cigars, Cigarettes    Last attempt to quit: 02/16/2015    Years since quitting: 2.7  . Smokeless tobacco: Former Systems developer    Types: Chew  . Tobacco comment: "chewed some when I was young"  Substance and Sexual Activity  . Alcohol use: No     Alcohol/week: 0.0 oz    Comment: 04/24/2015 "no beer in 2 wks; ;no liquor in 3-4 weeks; I'm not a real heavy drinker" states he stopped drining 2 yrs ago  . Drug use: No  . Sexual activity: Never  Lifestyle  . Physical activity:    Days per week: Not on file    Minutes per session: Not on file  . Stress: Not on file  Relationships  . Social connections:    Talks on phone: Not on file    Gets together: Not on file    Attends religious service: Not on file    Active member of club or organization: Not on file    Attends meetings of clubs or organizations: Not on file    Relationship status: Not on file  . Intimate partner violence:    Fear of current or ex partner: Not on file    Emotionally abused: Not on file    Physically abused: Not on file    Forced sexual activity: Not on file  Other Topics Concern  . Not on file  Social History Narrative  . Not on file    Family History  Problem Relation Age of Onset  . CVA Unknown   . Hypertension Unknown   . Diabetes Mother   . Hypertension Father   . Peripheral Artery Disease Father   . Alcohol abuse Father   . Heart attack Father     Past Surgical History:  Procedure Laterality Date  . CARDIAC CATHETERIZATION N/A 04/24/2015   Procedure: Left Heart Cath and Coronary Angiography;  Surgeon: [name], MD;  Location: [place] [facility];  Service: Cardiovascular;  Laterality: N/A;  . CARDIAC CATHETERIZATION N/A 04/24/2015   Procedure: Coronary Stent Intervention;  Surgeon: [name], MD;  Location: [place] [facility];  Service: Cardiovascular;  Laterality: N/A;  Prox RCA  . CARDIAC CATHETERIZATION Right 05/29/2015   Procedure: Angioplasty Perioneal and SFA;  Surgeon: [name], MD;  Location: [place] [facility];  Service: Cardiovascular;  Laterality: Right;  . COLONOSCOPY Left 02/29/2016   Procedure: COLONOSCOPY;  Surgeon: Carol Ada, MD;  [facility];  Service: Endoscopy;  Laterality: Left;  . COLONOSCOPY WITH PROPOFOL  N/A 11/23/2015   Procedure: COLONOSCOPY WITH PROPOFOL;  Surgeon: Carol Ada, MD;  [facility];  Service: Endoscopy;  Laterality: N/A;  . CORONARY ANGIOPLASTY    . ESOPHAGOGASTRODUODENOSCOPY (EGD) WITH PROPOFOL N/A 11/23/2015   Procedure: ESOPHAGOGASTRODUODENOSCOPY (EGD) WITH PROPOFOL;  Surgeon: Carol Ada, MD;  [facility];  Service: Endoscopy;  Laterality: N/A;  . EXCISIONAL HEMORRHOIDECTOMY  1980's  . GIVENS CAPSULE STUDY N/A 02/29/2016  Procedure: GIVENS CAPSULE STUDY;  Surgeon: Carol Ada, MD;  [facility];  Service: Endoscopy;  Laterality: N/A;  . LAPAROSCOPIC SMALL BOWEL RESECTION N/A 06/30/2017   Procedure: LAPAROSCOPIC SMALL BOWEL RESECTION;  Surgeon: Leighton Ruff, MD;  [facility];  Service: General;  Laterality: N/A;  . PERIPHERAL VASCULAR CATHETERIZATION N/A 04/24/2015   Procedure: Lower Extremity Angiography;  Surgeon: [name], MD;  Location: [place] [facility];  Service: Cardiovascular;  Laterality: N/A;  . PERIPHERAL VASCULAR CATHETERIZATION N/A 05/29/2015   Procedure: Lower Extremity Angiography;  Surgeon: [name], MD;  Location: [place] [facility];  Service: Cardiovascular;  Laterality: N/A;  . PERIPHERAL VASCULAR CATHETERIZATION Left 05/29/2015   Procedure: Renal Angiography;  Surgeon: [name], MD;  Location: [place] [facility];  Service: Cardiovascular;  Laterality: Left;  . PERIPHERAL VASCULAR CATHETERIZATION Left 06/12/2015   Procedure: Peripheral Vascular Atherectomy;  Surgeon: [name], MD;  Location: [place] [facility];  Service: Cardiovascular;  Laterality: Left;  AT  . PERIPHERAL VASCULAR CATHETERIZATION Left 06/12/2015   Procedure: Peripheral Vascular Intervention;  Surgeon: [name], MD;  Location: [place] [facility];  Service: Cardiovascular;  Laterality: Left;  PTA TP TRUNK  . UPPER GI ENDOSCOPY  12/16/11   findings:  mild gastritis and duodenitis    ROS: Review of Systems Negative except as stated above PHYSICAL  EXAM: BP (!) 144/80 (BP Location: Left Arm, Patient Position: Sitting, Cuff Size: Large)   Pulse 72   Temp 98 F (36.7 C) (Oral)   Resp 16   Wt 197 lb (89.4 kg)   SpO2 98%   BMI 29.95 kg/m   Repeat:  150/86 Physical Exam   General appearance - alert, well appearing, pleasant elderly African-American male and in no distress Mental status - alert, oriented to person, place, and time, normal mood, behavior, speech, dress, motor activity, and thought processes Neck - supple, no significant adenopathy Chest - clear to auscultation, no wheezes, rales or rhonchi, symmetric air entry Heart - normal rate, regular rhythm, normal S1, S2, no murmurs, rubs, clicks or gallops Extremities - peripheral pulses normal in upper extremities, no pedal edema,   Results for orders placed or performed in visit on 12/01/17  Glucose (CBG)  Result Value Ref Range   POC Glucose 202 (A) 70 - 99 mg/dl  HgB A1c  Result Value Ref Range   Hemoglobin A1C 9.2    Last hematocrit level was 43.  ASSESSMENT AND PLAN: 1. Diabetes mellitus type 2, uncontrolled, with complications ([place]) Not at goal.  Increase metformin to 1000 mg twice a day.  Check BMP to make sure eGFR is okay - Glucose (CBG) - HgB A1c - metFORMIN (GLUCOPHAGE-XR) 500 MG 24 hr tablet; Take 2 tablets (1,000 mg total) by mouth 2 (two) times daily.  Dispense: 120 tablet; Refill: 6  2. Essential hypertension Not at goal.  We can increase the chlorthalidone but this may cause a bump in his creatinine.  Other option would be to add hydralazine.  I will confer with his cardiologist Dr. Oval Linsey - Basic Metabolic Panel  3. Iron deficiency anemia due to chronic blood loss Will repeat ferritin and hematocrit level today.  If levels are good, we may consider having him stop the iron since intestinal mass was removed November of last year. - Ferritin - Hematocrit  4. Coronary artery disease involving native coronary artery of native heart without angina  pectoris Stable.  Followed by cardiology.   Patient was given the opportunity to ask  questions.  Patient verbalized understanding of the plan and was able to repeat key elements of the plan.   Orders Placed This Encounter  Procedures  . Ferritin  . Hematocrit  . Basic Metabolic Panel  . Glucose (CBG)  . HgB A1c     Requested Prescriptions   Signed Prescriptions Disp Refills  . metFORMIN (GLUCOPHAGE-XR) 500 MG 24 hr tablet 120 tablet 6    Sig: Take 2 tablets (1,000 mg total) by mouth 2 (two) times daily.    Return in about 3 months (around 03/02/2018).  Karle Plumber, MD, FACP

## 2017-12-01 NOTE — Progress Notes (Signed)
Follow up.

## 2017-12-02 ENCOUNTER — Encounter: Payer: Self-pay | Admitting: Internal Medicine

## 2017-12-02 ENCOUNTER — Telehealth: Payer: Self-pay | Admitting: Internal Medicine

## 2017-12-02 DIAGNOSIS — D500 Iron deficiency anemia secondary to blood loss (chronic): Secondary | ICD-10-CM

## 2017-12-02 LAB — HEMATOCRIT

## 2017-12-02 LAB — BASIC METABOLIC PANEL
BUN/Creatinine Ratio: 16 (ref 10–24)
BUN: 18 mg/dL (ref 8–27)
CO2: 18 mmol/L — ABNORMAL LOW (ref 20–29)
Calcium: 9.8 mg/dL (ref 8.6–10.2)
Chloride: 105 mmol/L (ref 96–106)
Creatinine, Ser: 1.12 mg/dL (ref 0.76–1.27)
GFR calc Af Amer: 75 mL/min/{1.73_m2} (ref >59)
GFR calc non Af Amer: 65 mL/min/{1.73_m2} (ref >59)
Glucose: 185 mg/dL — ABNORMAL HIGH (ref 65–99)
Potassium: 4.7 mmol/L (ref 3.5–5.2)
Sodium: 140 mmol/L (ref 134–144)

## 2017-12-02 LAB — FERRITIN: Ferritin: 110 ng/mL (ref 30–400)

## 2017-12-02 MED ORDER — CLONIDINE HCL 0.1 MG PO TABS: ORAL_TABLET | ORAL | 3 refills | Status: DC

## 2017-12-02 NOTE — Telephone Encounter (Signed)
-----   Message from Skeet Latch, MD sent at 12/01/2017  5:14 PM EDT ----- Hi Dr. Wynetta Emery,  Typically speaking I would prefer clonidine.  However you may have already experienced the fact that Mr. Colgate sometimes struggles with adherence.  Clonidine may be easer for him to manage.  Best, Tiffany  ----- Message ----- From: [name], MD Sent: 12/01/2017   1:23 PM To: Skeet Latch, MD  I am the PCP for this patient.  I saw him today in a routine follow-up visit.  He brought in blood pressure log with him.  His systolic blood pressure has been running between 150-180.  He is on maximum dose of amlodipine, Zebeta, 25 mg of chlorthalidone and isosorbide.  I went back and forth about adding hydralazine versus clonidine.  Do you have any suggestions?

## 2017-12-02 NOTE — Telephone Encounter (Signed)
PC placed to pt this a.m.  I informed him that I did hear back from his cardiologist Dr. Oval Linsey.  I confirmed with him that he is taking his BP meds consistently every day.  I recommend that we start a low dose of med called Clonidine.  Pt advise to let me know if it causes any dizziness, dry mouth or tiredness. Pt reports that he sometimes has a little bit of dry mouth.  I told him to let me know if it increases on the Clonidine.  Rxn sent to his pharmacy for pick up.

## 2017-12-02 NOTE — Progress Notes (Signed)
Message sent to pt via Mychart today: Your kidney function is okay.  The level of iron stores in your body looks good.  I would recommend stopping the iron tablets.  Please return to the laboratory in 1 month for recheck of your blood count off the iron.

## 2017-12-14 ENCOUNTER — Other Ambulatory Visit: Payer: Self-pay | Admitting: Cardiovascular Disease

## 2017-12-14 NOTE — Telephone Encounter (Signed)
Rx sent to pharmacy   

## 2018-01-19 DIAGNOSIS — H2513 Age-related nuclear cataract, bilateral: Secondary | ICD-10-CM | POA: Diagnosis not present

## 2018-01-19 DIAGNOSIS — E113293 Type 2 diabetes mellitus with mild nonproliferative diabetic retinopathy without macular edema, bilateral: Secondary | ICD-10-CM | POA: Diagnosis not present

## 2018-01-19 LAB — HM DIABETES EYE EXAM

## 2018-02-03 ENCOUNTER — Ambulatory Visit (HOSPITAL_COMMUNITY)
Admission: RE | Admit: 2018-02-03 | Discharge: 2018-02-03 | Disposition: A | Discharge status: Home / Self Care | Payer: Medicare HMO | Source: Ambulatory Visit | Attending: Cardiovascular Disease | Admitting: Cardiovascular Disease

## 2018-02-03 DIAGNOSIS — Z87891 Personal history of nicotine dependence: Secondary | ICD-10-CM | POA: Insufficient documentation

## 2018-02-03 DIAGNOSIS — I10 Essential (primary) hypertension: Secondary | ICD-10-CM | POA: Insufficient documentation

## 2018-02-03 DIAGNOSIS — E119 Type 2 diabetes mellitus without complications: Secondary | ICD-10-CM | POA: Insufficient documentation

## 2018-02-03 DIAGNOSIS — I739 Peripheral vascular disease, unspecified: Secondary | ICD-10-CM | POA: Diagnosis present

## 2018-02-03 DIAGNOSIS — I2510 Atherosclerotic heart disease of native coronary artery without angina pectoris: Secondary | ICD-10-CM | POA: Diagnosis not present

## 2018-02-15 ENCOUNTER — Telehealth: Payer: Self-pay | Admitting: Cardiovascular Disease

## 2018-02-15 NOTE — Telephone Encounter (Signed)
Notes recorded by Earvin Hansen, LPN on 02/20/8114 at 7:26 PM EDT Advised daughter Jone Baseman and scheduled appt with Dr Fletcher Anon tomorrow

## 2018-02-15 NOTE — Telephone Encounter (Signed)
New Message: ° ° ° ° ° ° °Pt is returning a call for results. °

## 2018-02-16 ENCOUNTER — Encounter: Payer: Self-pay | Admitting: Cardiovascular Disease

## 2018-02-16 ENCOUNTER — Ambulatory Visit (INDEPENDENT_AMBULATORY_CARE_PROVIDER_SITE_OTHER): Payer: Medicare HMO | Admitting: Cardiovascular Disease

## 2018-02-16 VITALS — BP 144/80 | HR 72 | Ht 68.0 in | Wt 194.6 lb

## 2018-02-16 DIAGNOSIS — I10 Essential (primary) hypertension: Secondary | ICD-10-CM | POA: Diagnosis not present

## 2018-02-16 DIAGNOSIS — E7800 Pure hypercholesterolemia, unspecified: Secondary | ICD-10-CM | POA: Diagnosis not present

## 2018-02-16 DIAGNOSIS — I2510 Atherosclerotic heart disease of native coronary artery without angina pectoris: Secondary | ICD-10-CM

## 2018-02-16 DIAGNOSIS — I739 Peripheral vascular disease, unspecified: Secondary | ICD-10-CM

## 2018-02-16 NOTE — Progress Notes (Signed)
Cardiology Office Note   Date:  02/16/2018   ID:  [name], DOB [birth year], MRN [id number]  PCP:  [name], MD  Cardiologist: Dr. Oval Linsey  No chief complaint on file.     History of Present Illness: [name] is a 72 y.o. male who was referred by Dr. Oval Linsey for evaluation and management of peripheral arterial disease.  He has known history of coronary artery disease status post RCA PCI, PAD with previous PTA, essential hypertension and poorly controlled diabetes.  He had prior GI bleed Status post partial small bowel resection in November 2018 for a carcinoid tumor.  He has history of poor compliance.  The patient had angiography done in 2016 by Dr. [name].  It showed patent renal arteries.  There was 99% stenosis in the right mid SFA which was treated with drug-coated balloon angioplasty.  He also had angioplasty of the right peroneal artery.  He then underwent staged PTA and atherectomy of the left anterior tibial artery. The patient reports that he was asymptomatic at the time of these interventions.  He underwent recent noninvasive vascular studies which showed normal ABI bilaterally.  However,toe pressure was abnormal and also the waveforms were abnormal.  Duplex showed moderate right SFA disease with occluded posterior tibial artery.  On the left, there was also moderate left SFA disease with occluded posterior tibial artery.  He denies any lower extremity claudication or ulceration.  No chest pain or shortness of breath.    Past Medical History:  Diagnosis Date  . Angiodysplasia of small intestine 02/2016   terminal ileum  . Bleeding gastric ulcer 2000's  . Chronic anemia   . Coronary artery disease   . Diverticulosis   . GERD (gastroesophageal reflux disease)   . GI bleeding   . History of blood transfusion 2000's   "related to bleeding gastric ulcer"  . Hyperlipidemia   . Hypertension   . Non-compliance   . PVD (peripheral vascular disease)  ([place])   . S/P coronary artery stent placement 10/03/2016  . Type II diabetes mellitus ([place])     Past Surgical History:  Procedure Laterality Date  . CARDIAC CATHETERIZATION N/A 04/24/2015   Procedure: Left Heart Cath and Coronary Angiography;  Surgeon: [name], MD;  Location: [facility] [facility];  Service: Cardiovascular;  Laterality: N/A;  . CARDIAC CATHETERIZATION N/A 04/24/2015   Procedure: Coronary Stent Intervention;  Surgeon: [name], MD;  Location: [place] [facility];  Service: Cardiovascular;  Laterality: N/A;  Prox RCA  . CARDIAC CATHETERIZATION Right 05/29/2015   Procedure: Angioplasty Perioneal and SFA;  Surgeon: [name], MD;  Location: [place] [facility];  Service: Cardiovascular;  Laterality: Right;  . COLONOSCOPY Left 02/29/2016   Procedure: COLONOSCOPY;  Surgeon: Carol Ada, MD;  [facility];  Service: Endoscopy;  Laterality: Left;  . COLONOSCOPY WITH PROPOFOL N/A 11/23/2015   Procedure: COLONOSCOPY WITH PROPOFOL;  Surgeon: Carol Ada, MD;  [facility];  Service: Endoscopy;  Laterality: N/A;  . CORONARY ANGIOPLASTY    . ESOPHAGOGASTRODUODENOSCOPY (EGD) WITH PROPOFOL N/A 11/23/2015   Procedure: ESOPHAGOGASTRODUODENOSCOPY (EGD) WITH PROPOFOL;  Surgeon: Carol Ada, MD;  [facility];  Service: Endoscopy;  Laterality: N/A;  . EXCISIONAL HEMORRHOIDECTOMY  1980's  . GIVENS CAPSULE STUDY N/A 02/29/2016   Procedure: GIVENS CAPSULE STUDY;  Surgeon: Carol Ada, MD;  [facility];  Service: Endoscopy;  Laterality: N/A;  . LAPAROSCOPIC SMALL BOWEL RESECTION N/A 06/30/2017   Procedure: LAPAROSCOPIC SMALL BOWEL RESECTION;  Surgeon: Leighton Ruff, MD;  [facility];  Service: General;  Laterality: N/A;  . PERIPHERAL VASCULAR CATHETERIZATION N/A 04/24/2015   Procedure: Lower Extremity Angiography;  Surgeon: [name], MD;  Location: [facility];  Service: Cardiovascular;  Laterality: N/A;  . PERIPHERAL VASCULAR CATHETERIZATION N/A  05/29/2015   Procedure: Lower Extremity Angiography;  Surgeon: [name], MD;  Location: [place] [facility];  Service: Cardiovascular;  Laterality: N/A;  . PERIPHERAL VASCULAR CATHETERIZATION Left 05/29/2015   Procedure: Renal Angiography;  Surgeon: [name], MD;  Location: [facility];  Service: Cardiovascular;  Laterality: Left;  . PERIPHERAL VASCULAR CATHETERIZATION Left 06/12/2015   Procedure: Peripheral Vascular Atherectomy;  Surgeon: [name], MD;  Location: [place] [facility];  Service: Cardiovascular;  Laterality: Left;  AT  . PERIPHERAL VASCULAR CATHETERIZATION Left 06/12/2015   Procedure: Peripheral Vascular Intervention;  Surgeon: [name], MD;  Location: [facility] [facility];  Service: Cardiovascular;  Laterality: Left;  PTA TP TRUNK  . UPPER GI ENDOSCOPY  12/16/11   findings:  mild gastritis and duodenitis     Current Outpatient Medications  Medication Sig Dispense Refill  . amLODipine (NORVASC) 10 MG tablet Take 10 mg by mouth daily.    Marland Kitchen aspirin 81 MG tablet Take 1 tablet (81 mg total) by mouth daily. 30 tablet 11  . atorvastatin (LIPITOR) 40 MG tablet Take 40 mg daily by mouth.    . bisoprolol (ZEBETA) 10 MG tablet Take 10 mg by mouth daily.    . chlorthalidone (HYGROTON) 25 MG tablet TAKE 1 TABLET BY MOUTH EVERY DAY 30 tablet 5  . cloNIDine (CATAPRES) 0.1 MG tablet 1/2 tab PO BID 30 tablet 3  . clopidogrel (PLAVIX) 75 MG tablet Take 1 tablet (75 mg total) by mouth daily. 90 tablet 3  . ferrous sulfate 325 (65 FE) MG EC tablet Take 1 tablet (325 mg total) by mouth 2 (two) times daily with a meal. (Patient taking differently: Take 325 mg by mouth daily with breakfast. ) 60 tablet 5  . glimepiride (AMARYL) 4 MG tablet Take 1 tablet (4 mg total) daily before breakfast by mouth. Additional refills from PCP 30 tablet 0  . isosorbide mononitrate (IMDUR) 30 MG 24 hr tablet Take 1 tablet (30 mg total) daily by mouth. 30 tablet 3  . metFORMIN (GLUCOPHAGE-XR) 500 MG 24 hr  tablet Take 2 tablets (1,000 mg total) by mouth 2 (two) times daily. 120 tablet 6  . polyethylene glycol (MIRALAX) packet Take 17 g daily as needed by mouth for mild constipation. 14 each 0  . rosuvastatin (CRESTOR) 20 MG tablet Take 1 tablet (20 mg total) by mouth daily. 30 tablet 5   No current facility-administered medications for this visit.     Allergies:   Patient has no known allergies.    Social History:  The patient  reports that he quit smoking about 3 years ago. His smoking use included cigars and cigarettes. He has a 54.00 pack-year smoking history. He has quit using smokeless tobacco. His smokeless tobacco use included chew. He reports that he does not drink alcohol or use drugs.   Family History:  The patient's family history includes Alcohol abuse in his father; CVA in his unknown relative; Diabetes in his mother; Heart attack in his father; Hypertension in his father and unknown relative; Peripheral Artery Disease in his father.    ROS:  Please see the history of present illness.   Otherwise, review of systems are positive for  none.   All other systems are reviewed and negative.    PHYSICAL EXAM: VS:  BP (!) 144/80   Pulse 72   Ht 5\' 8"  (1.727 m)   Wt 194 lb 9.6 oz (88.3 kg)   SpO2 98%   BMI 29.59 kg/m   , BMI Body mass index is 29.59 kg/m. GEN: Well nourished, well developed, in no acute distress  HEENT: normal  Neck: no JVD, carotid bruits, or masses Cardiac: RRR; no murmurs, rubs, or gallops,no edema  Respiratory:  clear to auscultation bilaterally, normal work of breathing GI: soft, nontender, nondistended, + BS MS: no deformity or atrophy  Skin: warm and dry, no rash Neuro:  Strength and sensation are intact Psych: euthymic mood, full affect   EKG:  EKG is not ordered today.   Recent Labs: 06/22/2017: ALT 7 06/26/2017: B Natriuretic Peptide 14.4 07/04/2017: Hemoglobin 10.2; Magnesium 1.9; Platelets 228 12/01/2017: BUN 18; Creatinine, Ser 1.12; Potassium  4.7; Sodium 140    Lipid Panel    Component Value Date/Time   CHOL 143 06/22/2017 0946   TRIG 126 06/22/2017 0946   HDL 37 (L) 06/22/2017 0946   CHOLHDL 3.9 06/22/2017 0946   CHOLHDL 3.7 10/31/2015 1559   VLDL 27 10/31/2015 1559   LDLCALC 81 06/22/2017 0946      Wt Readings from Last 3 Encounters:  02/16/18 194 lb 9.6 oz (88.3 kg)  12/01/17 197 lb (89.4 kg)  11/18/17 194 lb (88 kg)      No flowsheet data found.    ASSESSMENT AND PLAN:  1.  Peripheral arterial disease with previous endovascular intervention on bilateral lower extremity as outlined above.  Currently with no symptoms.  No claudication or evidence of critical limb ischemia.  Most recent vascular studies looked favorable overall with no significant areas of critical disease.  I recommend continuing medical therapy and risk factor modification.  2.  Coronary artery disease involving native coronary arteries without angina: Continue aggressive medical therapy.  3.  Essential hypertension: He is known to have refractory hypertension.  Previous renal angiography showed no evidence of renal artery stenosis.  4.  Hyperlipidemia: Continue treatment with rosuvastatin.  Most recent LDL was 81.    Disposition:   FU with me in 1 year  Signed,  Kathlyn Sacramento, MD  02/16/2018 9:11 AM    [place]

## 2018-02-16 NOTE — Patient Instructions (Signed)
Medication Instructions:  Your physician recommends that you continue on your current medications as directed. Please refer to the Current Medication list given to you today.  Follow-Up: Your physician wants you to follow-up in: 1 year with Dr. Fletcher Anon.  You will receive a reminder letter in the mail two months in advance. If you don't receive a letter, please call our office to schedule the follow-up appointment.   Any Other Special Instructions Will Be Listed Below (If Applicable).     If you need a refill on your cardiac medications before your next appointment, please call your pharmacy.

## 2018-03-09 ENCOUNTER — Ambulatory Visit: Payer: Medicare HMO | Admitting: Internal Medicine

## 2018-03-12 ENCOUNTER — Other Ambulatory Visit: Payer: Self-pay | Admitting: Cardiovascular Disease

## 2018-03-12 NOTE — Telephone Encounter (Signed)
Rx request sent to pharmacy.  

## 2018-04-05 ENCOUNTER — Ambulatory Visit: Payer: Medicare HMO | Attending: Internal Medicine | Admitting: Internal Medicine

## 2018-04-05 ENCOUNTER — Encounter: Payer: Self-pay | Admitting: Internal Medicine

## 2018-04-05 VITALS — BP 128/77 | HR 73 | Temp 98.4°F | Resp 16 | Wt 200.8 lb

## 2018-04-05 DIAGNOSIS — Z87891 Personal history of nicotine dependence: Secondary | ICD-10-CM | POA: Insufficient documentation

## 2018-04-05 DIAGNOSIS — I129 Hypertensive chronic kidney disease with stage 1 through stage 4 chronic kidney disease, or unspecified chronic kidney disease: Secondary | ICD-10-CM | POA: Diagnosis not present

## 2018-04-05 DIAGNOSIS — E1151 Type 2 diabetes mellitus with diabetic peripheral angiopathy without gangrene: Secondary | ICD-10-CM | POA: Insufficient documentation

## 2018-04-05 DIAGNOSIS — Z955 Presence of coronary angioplasty implant and graft: Secondary | ICD-10-CM | POA: Insufficient documentation

## 2018-04-05 DIAGNOSIS — N189 Chronic kidney disease, unspecified: Secondary | ICD-10-CM | POA: Diagnosis not present

## 2018-04-05 DIAGNOSIS — Z7901 Long term (current) use of anticoagulants: Secondary | ICD-10-CM | POA: Insufficient documentation

## 2018-04-05 DIAGNOSIS — Z794 Long term (current) use of insulin: Secondary | ICD-10-CM | POA: Insufficient documentation

## 2018-04-05 DIAGNOSIS — E118 Type 2 diabetes mellitus with unspecified complications: Secondary | ICD-10-CM

## 2018-04-05 DIAGNOSIS — E1165 Type 2 diabetes mellitus with hyperglycemia: Secondary | ICD-10-CM

## 2018-04-05 DIAGNOSIS — I10 Essential (primary) hypertension: Secondary | ICD-10-CM

## 2018-04-05 DIAGNOSIS — Z23 Encounter for immunization: Secondary | ICD-10-CM | POA: Insufficient documentation

## 2018-04-05 DIAGNOSIS — I2510 Atherosclerotic heart disease of native coronary artery without angina pectoris: Secondary | ICD-10-CM | POA: Diagnosis not present

## 2018-04-05 DIAGNOSIS — IMO0002 Reserved for concepts with insufficient information to code with codable children: Secondary | ICD-10-CM

## 2018-04-05 DIAGNOSIS — E785 Hyperlipidemia, unspecified: Secondary | ICD-10-CM | POA: Diagnosis not present

## 2018-04-05 DIAGNOSIS — Z79899 Other long term (current) drug therapy: Secondary | ICD-10-CM | POA: Insufficient documentation

## 2018-04-05 DIAGNOSIS — Z7982 Long term (current) use of aspirin: Secondary | ICD-10-CM | POA: Diagnosis not present

## 2018-04-05 DIAGNOSIS — D500 Iron deficiency anemia secondary to blood loss (chronic): Secondary | ICD-10-CM | POA: Diagnosis not present

## 2018-04-05 DIAGNOSIS — E1122 Type 2 diabetes mellitus with diabetic chronic kidney disease: Secondary | ICD-10-CM | POA: Diagnosis not present

## 2018-04-05 LAB — POCT GLYCOSYLATED HEMOGLOBIN (HGB A1C): HbA1c, POC (controlled diabetic range): 9.3 % — AB (ref 0.0–7.0)

## 2018-04-05 LAB — GLUCOSE, POCT (MANUAL RESULT ENTRY): POC Glucose: 349 mg/dl — AB (ref 70–99)

## 2018-04-05 MED ORDER — INSULIN DETEMIR 100 UNIT/ML FLEXPEN: 8.0000 [IU] | PEN_INJECTOR | Freq: Every day | SUBCUTANEOUS | 11 refills | Status: DC

## 2018-04-05 MED ORDER — PEN NEEDLES 31G X 6 MM MISC: 6 refills | Status: DC

## 2018-04-05 NOTE — Progress Notes (Signed)
Patient ID: [name], male    DOB: 1946/02/12  MRN: [id number]  CC: Diabetes and Hypertension   Subjective: [name] is a 72 y.o. male who presents for chronic ds management His concerns today include:  Pt with hx of IDA due to occult small bowel bleeding with resection of benign neuroendocrine tumor  06/2017, CAD with hx of PCI, PVD, CKD, HTN, HL, DM type 2  HTN/CAD:  Clonidine added on last visit.  He reports compliance with this and his other blood pressure medications that include bisoprolol, chlorthalidone, amlodipine and isosorbide..   Checks BP occasionally; does not recall range but thinks it has been good. No CP/SOB/LE  DM: not checking BS regularly Reports compliance with Metformin and Amaryl Eating habit:  "I just eat, anything I want. I feel good."  Does not eat pork.  Loves potatoes and rice. Drinks water and beer (few cans on wkend).  Had eye exam done 2 mths ago Dr. Schuyler Amor. No retinopathy  IDA:  I recommend stopping iron after we checked iron studies on last visit.  Still taking iron once a day.  States he feels better taking it.    Patient Active Problem List   Diagnosis Date Noted  . Benign neuroendocrine tumor of small intestine 09/02/2017  . Chronic anticoagulation 06/28/2017  . Iron deficiency anemia due to chronic blood loss 06/26/2017  . Hypertensive cardiovascular disease 06/25/2017  . PVC's (premature ventricular contractions) 01/26/2017  . CAD S/P percutaneous coronary angioplasty 10/03/2016  . Anemia due to chronic blood loss 02/28/2016  . Chest pain with high risk for cardiac etiology 04/22/2015  . Claudication in peripheral vascular disease ([place]) 04/22/2015  . Anemia, iron deficiency 10/17/2014  . Essential hypertension 10/17/2014  . DM2 (diabetes mellitus, type 2) ([place]) 04/08/2012  . PUD (peptic ulcer disease) 11/11/2011     Current Outpatient Medications on File Prior to Visit  Medication Sig Dispense Refill  . amLODipine (NORVASC)  10 MG tablet Take 10 mg by mouth daily.    Marland Kitchen amLODipine (NORVASC) 10 MG tablet TAKE 1 TABLET BY MOUTH EVERY DAY 90 tablet 1  . aspirin 81 MG tablet Take 1 tablet (81 mg total) by mouth daily. 30 tablet 11  . atorvastatin (LIPITOR) 40 MG tablet Take 40 mg daily by mouth.    . bisoprolol (ZEBETA) 10 MG tablet Take 10 mg by mouth daily.    . chlorthalidone (HYGROTON) 25 MG tablet TAKE 1 TABLET BY MOUTH EVERY DAY 30 tablet 5  . cloNIDine (CATAPRES) 0.1 MG tablet 1/2 tab PO BID 30 tablet 3  . clopidogrel (PLAVIX) 75 MG tablet Take 1 tablet (75 mg total) by mouth daily. 90 tablet 3  . ferrous sulfate 325 (65 FE) MG EC tablet Take 1 tablet (325 mg total) by mouth 2 (two) times daily with a meal. (Patient taking differently: Take 325 mg by mouth daily with breakfast. ) 60 tablet 5  . glimepiride (AMARYL) 4 MG tablet Take 1 tablet (4 mg total) daily before breakfast by mouth. Additional refills from PCP 30 tablet 0  . isosorbide mononitrate (IMDUR) 30 MG 24 hr tablet Take 1 tablet (30 mg total) daily by mouth. 30 tablet 3  . metFORMIN (GLUCOPHAGE-XR) 500 MG 24 hr tablet Take 2 tablets (1,000 mg total) by mouth 2 (two) times daily. 120 tablet 6  . polyethylene glycol (MIRALAX) packet Take 17 g daily as needed by mouth for mild constipation. 14 each 0  . rosuvastatin (CRESTOR) 20 MG tablet Take  1 tablet (20 mg total) by mouth daily. 30 tablet 5   No current facility-administered medications on file prior to visit.     No Known Allergies  Social History   Socioeconomic History  . Marital status: Married    Spouse name: Not on file  . Number of children: Not on file  . Years of education: Not on file  . Highest education level: Not on file  Occupational History  . Not on file  Social Needs  . Financial resource strain: Not on file  . Food insecurity:    Worry: Not on file    Inability: Not on file  . Transportation needs:    Medical: Not on file    Non-medical: Not on file  Tobacco Use  .  Smoking status: Former Smoker    Packs/day: 1.00    Years: 54.00    Pack years: 54.00    Types: Cigars, Cigarettes    Last attempt to quit: 02/16/2015    Years since quitting: 3.1  . Smokeless tobacco: Former Systems developer    Types: Chew  . Tobacco comment: "chewed some when I was young"  Substance and Sexual Activity  . Alcohol use: No    Alcohol/week: 0.0 standard drinks    Comment: 04/24/2015 "no beer in 2 wks; ;no liquor in 3-4 weeks; I'm not a real heavy drinker" states he stopped drining 2 yrs ago  . Drug use: No  . Sexual activity: Never  Lifestyle  . Physical activity:    Days per week: Not on file    Minutes per session: Not on file  . Stress: Not on file  Relationships  . Social connections:    Talks on phone: Not on file    Gets together: Not on file    Attends religious service: Not on file    Active member of club or organization: Not on file    Attends meetings of clubs or organizations: Not on file    Relationship status: Not on file  . Intimate partner violence:    Fear of current or ex partner: Not on file    Emotionally abused: Not on file    Physically abused: Not on file    Forced sexual activity: Not on file  Other Topics Concern  . Not on file  Social History Narrative  . Not on file    Family History  Problem Relation Age of Onset  . CVA Unknown   . Hypertension Unknown   . Diabetes Mother   . Hypertension Father   . Peripheral Artery Disease Father   . Alcohol abuse Father   . Heart attack Father     Past Surgical History:  Procedure Laterality Date  . CARDIAC CATHETERIZATION N/A 04/24/2015   Procedure: Left Heart Cath and Coronary Angiography;  Surgeon: [name], MD;  Location: [facility];  Service: Cardiovascular;  Laterality: N/A;  . CARDIAC CATHETERIZATION N/A 04/24/2015   Procedure: Coronary Stent Intervention;  Surgeon: [name], MD;  Location: [facility];  Service: Cardiovascular;  Laterality: N/A;  Prox RCA  . CARDIAC  CATHETERIZATION Right 05/29/2015   Procedure: Angioplasty Perioneal and SFA;  Surgeon: [name], MD;  Location: [facility];  Service: Cardiovascular;  Laterality: Right;  . COLONOSCOPY Left 02/29/2016   Procedure: COLONOSCOPY;  Surgeon: Carol Ada, MD;  [facility];  Service: Endoscopy;  Laterality: Left;  . COLONOSCOPY WITH PROPOFOL N/A 11/23/2015   Procedure: COLONOSCOPY WITH PROPOFOL;  Surgeon: Saralyn Pilar  [name], MD;  Location: [facility];  Service: Endoscopy;  Laterality: N/A;  . CORONARY ANGIOPLASTY    . ESOPHAGOGASTRODUODENOSCOPY (EGD) WITH PROPOFOL N/A 11/23/2015   Procedure: ESOPHAGOGASTRODUODENOSCOPY (EGD) WITH PROPOFOL;  Surgeon: Carol Ada, MD;  [facility];  Service: Endoscopy;  Laterality: N/A;  . EXCISIONAL HEMORRHOIDECTOMY  1980's  . GIVENS CAPSULE STUDY N/A 02/29/2016   Procedure: GIVENS CAPSULE STUDY;  Surgeon: Carol Ada, MD;  [facility];  Service: Endoscopy;  Laterality: N/A;  . LAPAROSCOPIC SMALL BOWEL RESECTION N/A 06/30/2017   Procedure: LAPAROSCOPIC SMALL BOWEL RESECTION;  Surgeon: Leighton Ruff, MD;  [facility];  Service: General;  Laterality: N/A;  . PERIPHERAL VASCULAR CATHETERIZATION N/A 04/24/2015   Procedure: Lower Extremity Angiography;  Surgeon: [name], MD;  Location: [facility];  Service: Cardiovascular;  Laterality: N/A;  . PERIPHERAL VASCULAR CATHETERIZATION N/A 05/29/2015   Procedure: Lower Extremity Angiography;  Surgeon: [name], MD;  Location: [facility];  Service: Cardiovascular;  Laterality: N/A;  . PERIPHERAL VASCULAR CATHETERIZATION Left 05/29/2015   Procedure: Renal Angiography;  Surgeon: [name], MD;  Location: [facility];  Service: Cardiovascular;  Laterality: Left;  . PERIPHERAL VASCULAR CATHETERIZATION Left 06/12/2015   Procedure: Peripheral Vascular Atherectomy;  Surgeon: [name], MD;  Location: [facility];  Service: Cardiovascular;  Laterality: Left;  AT  .  PERIPHERAL VASCULAR CATHETERIZATION Left 06/12/2015   Procedure: Peripheral Vascular Intervention;  Surgeon: [name], MD;  Location: [facility];  Service: Cardiovascular;  Laterality: Left;  PTA TP TRUNK  . UPPER GI ENDOSCOPY  12/16/11   findings:  mild gastritis and duodenitis    ROS: Review of Systems Negative except as above. PHYSICAL EXAM: BP 128/77   Pulse 73   Temp 98.4 F (36.9 C) (Oral)   Resp 16   Wt 200 lb 12.8 oz (91.1 kg)   SpO2 97%   BMI 30.53 kg/m   Physical Exam  General appearance - alert, well appearing, and in no distress Mental status - normal mood, behavior, speech, dress, motor activity, and thought processes Eyes - pupils equal and reactive, extraocular eye movements intact Mouth - mucous membranes moist, pharynx normal without lesions Neck - supple, no significant adenopathy Chest - clear to auscultation, no wheezes, rales or rhonchi, symmetric air entry Heart - normal rate, regular rhythm, normal S1, S2, no murmurs, rubs, clicks or gallops Extremities -no LE edema Diabetic Foot Exam - Simple   Simple Foot Form Visual Inspection No deformities, no ulcerations, no other skin breakdown bilaterally:  Yes Sensation Testing Intact to touch and monofilament testing bilaterally:  Yes Pulse Check Posterior Tibialis and Dorsalis pulse intact bilaterally:  Yes Comments      Results for orders placed or performed in visit on 12/01/17  Ferritin  Result Value Ref Range   Ferritin 110 30 - 400 ng/mL  Hematocrit  Result Value Ref Range   Hematocrit CANCELED %   Hematology Comments: CANCELED   Basic Metabolic Panel  Result Value Ref Range   Glucose 185 (H) 65 - 99 mg/dL   BUN 18 8 - 27 mg/dL   Creatinine, Ser 1.12 0.76 - 1.27 mg/dL   GFR calc non Af Amer 65 >59 mL/min/1.73   GFR calc Af Amer 75 >59 mL/min/1.73   BUN/Creatinine Ratio 16 10 - 24   Sodium 140 134 - 144 mmol/L   Potassium 4.7 3.5 - 5.2 mmol/L   Chloride 105 96 - 106 mmol/L  CO2 18 (L) 20 - 29 mmol/L   Calcium 9.8 8.6 - 10.2 mg/dL  Glucose (CBG)  Result Value Ref Range   POC Glucose 202 (A) 70 - 99 mg/dl  HgB A1c  Result Value Ref Range   Hemoglobin A1C 9.2    Results for orders placed or performed in visit on 04/05/18  POCT glucose (manual entry)  Result Value Ref Range   POC Glucose 349 (A) 70 - 99 mg/dl  POCT glycosylated hemoglobin (Hb A1C)  Result Value Ref Range   Hemoglobin A1C     HbA1c POC (<> result, manual entry)     HbA1c, POC (prediabetic range)     HbA1c, POC (controlled diabetic range) 9.3 (A) 0.0 - 7.0 %    ASSESSMENT AND PLAN: 1. Diabetes mellitus type 2, uncontrolled, with complications ([place]) Encourage healthy eating habits.  He is already exercising daily. Discussed adding low-dose of Levemir insulin to take at bedtime.  He will follow-up with clinical pharmacist in 1 month and will bring blood sugar readings with him for review and further insulin titration if needed. - POCT glucose (manual entry) - POCT glycosylated hemoglobin (Hb A1C) - Insulin Detemir (LEVEMIR) 100 UNIT/ML Pen; Inject 8 Units into the skin at bedtime.  Dispense: 15 mL; Refill: 11 - Insulin Pen Needle (PEN NEEDLES) 31G X 6 MM MISC; Use as directed  Dispense: 100 each; Refill: 6  2. Essential hypertension At goal.  Continue current medications and low-salt diet.  3. Coronary artery disease involving native coronary artery of native heart without angina pectoris Clinically stable on current drug regimen.  4. Iron deficiency anemia due to chronic blood loss - CBC  5. Need for immunization against influenza - Flu Vaccine QUAD 36+ mos IM   Patient was given the opportunity to ask questions.  Patient verbalized understanding of the plan and was able to repeat key elements of the plan.   Orders Placed This Encounter  Procedures  . Flu Vaccine QUAD 36+ mos IM     Requested Prescriptions    No prescriptions requested or ordered in this encounter    No  follow-ups on file.  Karle Plumber, MD, FACP

## 2018-04-05 NOTE — Patient Instructions (Addendum)
Please give patient an appointment with the clinical pharmacist, Lurena Joiner, in 1 month for blood sugar monitoring and insulin titration.  Start Levemir insulin 8 units at bedtime.  Please check your blood sugars at least once to twice a day before meals and bring in the readings on your visit in 1 month with the clinical pharmacist.  Try cutting back on some of the white carbohydrates like potatoes and rice.   Influenza Virus Vaccine injection (Fluarix) What is this medicine? INFLUENZA VIRUS VACCINE (in floo EN zuh VAHY ruhs vak SEEN) helps to reduce the risk of getting influenza also known as the flu. This medicine may be used for other purposes; ask your health care provider or pharmacist if you have questions. COMMON BRAND NAME(S): Fluarix, Fluzone What should I tell my health care provider before I take this medicine? They need to know if you have any of these conditions: -bleeding disorder like hemophilia -fever or infection -Guillain-Barre syndrome or other neurological problems -immune system problems -infection with the human immunodeficiency virus (HIV) or AIDS -low blood platelet counts -multiple sclerosis -an unusual or allergic reaction to influenza virus vaccine, eggs, chicken proteins, latex, gentamicin, other medicines, foods, dyes or preservatives -pregnant or trying to get pregnant -breast-feeding How should I use this medicine? This vaccine is for injection into a muscle. It is given by a health care professional. A copy of Vaccine Information Statements will be given before each vaccination. Read this sheet carefully each time. The sheet may change frequently. Talk to your pediatrician regarding the use of this medicine in children. Special care may be needed. Overdosage: If you think you have taken too much of this medicine contact a poison control center or emergency room at once. NOTE: This medicine is only for you. Do not share this medicine with others. What if I miss  a dose? This does not apply. What may interact with this medicine? -chemotherapy or radiation therapy -medicines that lower your immune system like etanercept, anakinra, infliximab, and adalimumab -medicines that treat or prevent blood clots like warfarin -phenytoin -steroid medicines like prednisone or cortisone -theophylline -vaccines This list may not describe all possible interactions. Give your health care provider a list of all the medicines, herbs, non-prescription drugs, or dietary supplements you use. Also tell them if you smoke, drink alcohol, or use illegal drugs. Some items may interact with your medicine. What should I watch for while using this medicine? Report any side effects that do not go away within 3 days to your doctor or health care professional. Call your health care provider if any unusual symptoms occur within 6 weeks of receiving this vaccine. You may still catch the flu, but the illness is not usually as bad. You cannot get the flu from the vaccine. The vaccine will not protect against colds or other illnesses that may cause fever. The vaccine is needed every year. What side effects may I notice from receiving this medicine? Side effects that you should report to your doctor or health care professional as soon as possible: -allergic reactions like skin rash, itching or hives, swelling of the face, lips, or tongue Side effects that usually do not require medical attention (report to your doctor or health care professional if they continue or are bothersome): -fever -headache -muscle aches and pains -pain, tenderness, redness, or swelling at site where injected -weak or tired This list may not describe all possible side effects. Call your doctor for medical advice about side effects. You may report side  effects to FDA at 1-800-FDA-1088. Where should I keep my medicine? This vaccine is only given in a clinic, pharmacy, doctor's office, or other health care setting and  will not be stored at home. NOTE: This sheet is a summary. It may not cover all possible information. If you have questions about this medicine, talk to your doctor, pharmacist, or health care provider.  2018 Elsevier/Gold Standard (2008-03-01 09:30:40)

## 2018-04-05 NOTE — Progress Notes (Signed)
cbg 349 a1c 9.3

## 2018-04-06 LAB — CBC
Hematocrit: 44.5 % (ref 37.5–51.0)
Hemoglobin: 14.6 g/dL (ref 13.0–17.7)
MCH: 30.9 pg (ref 26.6–33.0)
MCHC: 32.8 g/dL (ref 31.5–35.7)
MCV: 94 fL (ref 79–97)
Platelets: 221 10*3/uL (ref 150–450)
RBC: 4.73 x10E6/uL (ref 4.14–5.80)
RDW: 12.6 % (ref 12.3–15.4)
WBC: 6.1 10*3/uL (ref 3.4–10.8)

## 2018-04-07 ENCOUNTER — Telehealth: Payer: Self-pay

## 2018-04-07 NOTE — Telephone Encounter (Signed)
Contacted pt to go over lab results pt didn't answer left a detailed vm informing pt of results and if he has any questions or concerns to give me a call    If pt calls back please give results: blood count is normal. Stop Iron supplement.

## 2018-04-13 ENCOUNTER — Other Ambulatory Visit: Payer: Self-pay | Admitting: Cardiovascular Disease

## 2018-04-13 NOTE — Telephone Encounter (Signed)
Rx sent to pharmacy   

## 2018-04-21 ENCOUNTER — Encounter: Payer: Self-pay | Admitting: Cardiovascular Disease

## 2018-04-21 ENCOUNTER — Ambulatory Visit: Payer: Medicare HMO | Admitting: Cardiovascular Disease

## 2018-04-21 VITALS — BP 128/64 | HR 75 | Ht 68.0 in | Wt 195.0 lb

## 2018-04-21 DIAGNOSIS — I10 Essential (primary) hypertension: Secondary | ICD-10-CM | POA: Diagnosis not present

## 2018-04-21 DIAGNOSIS — Z5181 Encounter for therapeutic drug level monitoring: Secondary | ICD-10-CM | POA: Diagnosis not present

## 2018-04-21 DIAGNOSIS — E7800 Pure hypercholesterolemia, unspecified: Secondary | ICD-10-CM

## 2018-04-21 DIAGNOSIS — I25119 Atherosclerotic heart disease of native coronary artery with unspecified angina pectoris: Secondary | ICD-10-CM

## 2018-04-21 DIAGNOSIS — I739 Peripheral vascular disease, unspecified: Secondary | ICD-10-CM

## 2018-04-21 NOTE — Progress Notes (Signed)
Cardiology Office Note     Date:  04/21/2018   ID:  [name], DOB Dec 23, 1945, MRN [id number]  PCP:  [name], MD  Cardiologist:   Skeet Latch, MD   No chief complaint on file.    History of Present Illness: [name] is a 72 y.o. male with CAD s/p LAD PCI, PAD s/p peripheral stenting, hypertension, poorly-controlled diabetes and non-compliance who presents for follow up.  He was first seen 09/2016. He was hospitalized 02/2016 with melena.   His hemoglobin was 7.1 at the time.  He declined work up and restarted Plavix on his own.  He underwent upper and lower endoscopy and was found to have diverticulosis but no source of bleeding.  No bleeding was found on capsule endoscopy at [facility].  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  Per a letter from his daughter and talking with his wife he is very noncompliant with all medications.  [name] was referred for [facility] 10/24/16 that revealed LVEF 49% with no ischemia and global hypokinesis. He was referred for an echocardiogram which has not yet occurred.  He also had ABIs that revealed greater than 50% right distal SFA stenosis status post angioplasty and 30-49% left mid to distal superficial SFA stenosis.  Repeat Dopplers 02/03/2018 revealed mild progression with 50 to 74% stenosis in the right SFA and 30 to 49% stenosis in the proximal peroneal artery.  On the left he had 50 to 74% of the SFA.  He saw Dr. Fletcher Anon 02/2018.  He was felt to be stable and had no symptoms of claudication.  Therefore medical management was recommended.  Since his last appointment [name] has been feeling well.  He saw Dr. Wynetta Emery on 04/05/2018.  His hemoglobin A1c was over 9%.  Levemir insulin was added.  However he never picked it up from the pharmacy because it was going to cost him over $200.  He never contacted Dr. Durenda Age office to let them know.  He reports that his blood sugars have been "fine."   He is unable to give details of the exact numbers.  He has not experienced any chest pain or shortness of breath.  He walks his dog each morning without any exertional symptoms.  He has not noted any lower extremity edema, orthopnea, or PND.     Past Medical History:  Diagnosis Date  . Angiodysplasia of small intestine 02/2016   terminal ileum  . Bleeding gastric ulcer 2000's  . Chronic anemia   . Coronary artery disease   . Diverticulosis   . GERD (gastroesophageal reflux disease)   . GI bleeding   . History of blood transfusion 2000's   "related to bleeding gastric ulcer"  . Hyperlipidemia   . Hypertension   . Non-compliance   . PVD (peripheral vascular disease) ([place])   . S/P coronary artery stent placement 10/03/2016  . Type II diabetes mellitus ([place])     Past Surgical History:  Procedure Laterality Date  . CARDIAC CATHETERIZATION N/A 04/24/2015   Procedure: Left Heart Cath and Coronary Angiography;  Surgeon: [name], MD;  Location: [place] [facility];  Service: Cardiovascular;  Laterality: N/A;  . CARDIAC CATHETERIZATION N/A 04/24/2015   Procedure: Coronary Stent Intervention;  Surgeon: [name], MD;  Location: [place] [facility];  Service: Cardiovascular;  Laterality: N/A;  Prox RCA  . CARDIAC CATHETERIZATION Right 05/29/2015   Procedure: Angioplasty Perioneal and SFA;  Surgeon: [name], MD;  Location: [place] [facility];  Service: Cardiovascular;  Laterality: Right;  . COLONOSCOPY Left 02/29/2016   Procedure: COLONOSCOPY;  Surgeon: Carol Ada, MD;  Location: [facility];  Service: Endoscopy;  Laterality: Left;  . COLONOSCOPY WITH PROPOFOL N/A 11/23/2015   Procedure: COLONOSCOPY WITH PROPOFOL;  Surgeon: Carol Ada, MD;  Location: [facility];  Service: Endoscopy;  Laterality: N/A;  . CORONARY ANGIOPLASTY    . ESOPHAGOGASTRODUODENOSCOPY (EGD) WITH PROPOFOL N/A 11/23/2015   Procedure: ESOPHAGOGASTRODUODENOSCOPY (EGD) WITH PROPOFOL;  Surgeon: Carol Ada, MD;  Location: [facility];  Service: Endoscopy;  Laterality: N/A;  . EXCISIONAL HEMORRHOIDECTOMY  1980's  . GIVENS CAPSULE STUDY N/A 02/29/2016   Procedure: GIVENS CAPSULE STUDY;  Surgeon: Carol Ada, MD;  Location: [facility];  Service: Endoscopy;  Laterality: N/A;  . LAPAROSCOPIC SMALL BOWEL RESECTION N/A 06/30/2017   Procedure: LAPAROSCOPIC SMALL BOWEL RESECTION;  Surgeon: Leighton Ruff, MD;  Location: [facility];  Service: General;  Laterality: N/A;  . PERIPHERAL VASCULAR CATHETERIZATION N/A 04/24/2015   Procedure: Lower Extremity Angiography;  Surgeon: [name], MD;  Location: [place] [facility];  Service: Cardiovascular;  Laterality: N/A;  . PERIPHERAL VASCULAR CATHETERIZATION N/A 05/29/2015   Procedure: Lower Extremity Angiography;  Surgeon: [name], MD;  Location: [place] [facility];  Service: Cardiovascular;  Laterality: N/A;  . PERIPHERAL VASCULAR CATHETERIZATION Left 05/29/2015   Procedure: Renal Angiography;  Surgeon: [name], MD;  Location: [place] [facility];  Service: Cardiovascular;  Laterality: Left;  . PERIPHERAL VASCULAR CATHETERIZATION Left 06/12/2015   Procedure: Peripheral Vascular Atherectomy;  Surgeon: [name], MD;  Location: [place] [facility];  Service: Cardiovascular;  Laterality: Left;  AT  . PERIPHERAL VASCULAR CATHETERIZATION Left 06/12/2015   Procedure: Peripheral Vascular Intervention;  Surgeon: [name], MD;  Location: [place] [facility];  Service: Cardiovascular;  Laterality: Left;  PTA TP TRUNK  . UPPER GI ENDOSCOPY  12/16/11   findings:  mild gastritis and duodenitis     Current Outpatient Medications  Medication Sig Dispense Refill  . amLODipine (NORVASC) 10 MG tablet TAKE 1 TABLET BY MOUTH EVERY DAY 90 tablet 1  . aspirin 81 MG tablet Take 1 tablet (81 mg total) by mouth daily. 30 tablet 11  . atorvastatin (LIPITOR) 40 MG tablet Take 40 mg daily by mouth.    . bisoprolol (ZEBETA) 10 MG tablet Take 10 mg by mouth daily.    . chlorthalidone (HYGROTON) 25 MG tablet  TAKE 1 TABLET BY MOUTH EVERY DAY 90 tablet 1  . cloNIDine (CATAPRES) 0.1 MG tablet 1/2 tab PO BID 30 tablet 3  . ferrous sulfate 325 (65 FE) MG EC tablet Take 1 tablet (325 mg total) by mouth 2 (two) times daily with a meal. (Patient taking differently: Take 325 mg by mouth daily with breakfast. ) 60 tablet 5  . glimepiride (AMARYL) 4 MG tablet Take 1 tablet (4 mg total) daily before breakfast by mouth. Additional refills from PCP 30 tablet 0  . Insulin Pen Needle (PEN NEEDLES) 31G X 6 MM MISC Use as directed 100 each 6  . isosorbide mononitrate (IMDUR) 30 MG 24 hr tablet Take 1 tablet (30 mg total) daily by mouth. 30 tablet 3  . metFORMIN (GLUCOPHAGE-XR) 500 MG 24 hr tablet Take 2 tablets (1,000 mg total) by mouth 2 (two) times daily. 120 tablet 6  . polyethylene glycol (MIRALAX) packet Take 17 g daily as needed by mouth for mild constipation. 14 each 0  . rosuvastatin (CRESTOR) 20 MG tablet Take  1 tablet (20 mg total) by mouth daily. 30 tablet 5   No current facility-administered medications for this visit.     Allergies:   Patient has no known allergies.    Social History:  The patient  reports that he quit smoking about 3 years ago. His smoking use included cigars and cigarettes. He has a 54.00 pack-year smoking history. He has quit using smokeless tobacco.  His smokeless tobacco use included chew. He reports that he does not drink alcohol or use drugs.   Family History:  The patient's family history includes Alcohol abuse in his father; CVA in his unknown relative; Diabetes in his mother; Heart attack in his father; Hypertension in his father and unknown relative; Peripheral Artery Disease in his father.    ROS:  Please see the history of present illness.   Otherwise, review of systems are positive for none.   All other systems are reviewed and negative.    PHYSICAL EXAM: VS:  BP 128/64   Pulse 75   Ht 5\' 8"  (1.727 m)   Wt 195 lb (88.5 kg)   BMI 29.65 kg/m  , BMI Body mass index is  29.65 kg/m. GENERAL:  Well appearing HEENT: Pupils equal round and reactive, fundi not visualized, oral mucosa unremarkable NECK:  No jugular venous distention, waveform within normal limits, carotid upstroke brisk and symmetric, no bruits LUNGS:  Clear to auscultation bilaterally HEART:  RRR.  PMI not displaced or sustained,S1 and S2 within normal limits, no S3, no S4, no clicks, no rubs, no murmurs ABD:  Flat, positive bowel sounds normal in frequency in pitch, no bruits, no rebound, no guarding, no midline pulsatile mass, no hepatomegaly, no splenomegaly EXT:  2 plus radial pulses, no edema, no cyanosis no clubbing SKIN:  No rashes no nodules NEURO:  Cranial nerves II through XII grossly intact, motor grossly intact throughout PSYCH:  Cognitively intact, oriented to person place and time   EKG:  EKG is ordered today. 09/18/17: Sinus rhythm.  Rate 67 bpm.  Cannot rule out prior septal infarct. 04/21/18: Sinus rhythm.  Rate 75 bpm.  Prior anteroseptal and inferior infarcts.  LAD.  Echo 6/27/8: Study Conclusions  - Left ventricle: The cavity size was normal. Wall thickness was   increased in a pattern of moderate LVH. Systolic function was   normal. The estimated ejection fraction was in the range of 60%   to 65%. Wall motion was normal; there were no regional wall   motion abnormalities. Doppler parameters are consistent with   abnormal left ventricular relaxation (grade 1 diastolic   dysfunction). - Left atrium: The atrium was mildly dilated. - Atrial septum: Mobile atrial septum no obvoius PFO consider f/u   bubble study if clinically indicated.  Lexiscan Myoview 10/24/16: The left ventricular ejection fraction is mildly decreased (45-54%).  Nuclear stress EF: 49%.  There was no ST segment deviation noted during stress.  The study is normal.  This is a low risk study.   ABI 10/21/16:  >50% right distal superficial femoral artery stenosis, s/p angioplasty. 30-49% left mid to  distal superficial femoral artery stenosis. Three vessel run-off, bilaterally.   Recent Labs: 06/22/2017: ALT 7 06/26/2017: B Natriuretic Peptide 14.4 07/04/2017: Magnesium 1.9 12/01/2017: BUN 18; Creatinine, Ser 1.12; Potassium 4.7; Sodium 140 04/05/2018: Hemoglobin 14.6; Platelets 221    Lipid Panel    Component Value Date/Time   CHOL 143 06/22/2017 0946   TRIG 126 06/22/2017 0946   HDL 37 (L) 06/22/2017 8588  CHOLHDL 3.9 06/22/2017 0946   CHOLHDL 3.7 10/31/2015 1559   VLDL 27 10/31/2015 1559   LDLCALC 81 06/22/2017 0946      Wt Readings from Last 3 Encounters:  04/21/18 195 lb (88.5 kg)  04/05/18 200 lb 12.8 oz (91.1 kg)  02/16/18 194 lb 9.6 oz (88.3 kg)     ASSESSMENT AND PLAN:  # Hypertension:  Blood pressure doing very well.  Continue amlodipine, bisoprolol, chlorthalidone, clonidine, and Imdur.  # CAD s/p LAD PCI:  # Hyperlipidemia: No chest pain. Stress test 10/2016 was negative for ischemia.  Continue aspirin, bisoprolol, Imdur, and rosuvastatin 20 mg daily.  Stop clopidogrel.  He will return for fasting lipids and a CMP.  # Leg numbness: # PAD: Moderate disease bilaterally.  Continue rosuvastatin and aspirin.  Stop clopidogrel.  Blood pressure, lipid, and diabetes control will be extremely key in preventing this from progressing.  # DM: Mr.Careaga was prescribed insulin but has not picked it up because it was too expensive.  Will contact his PCP and see if there are alternatives.  We will consider adding an SGLT2 inhibitor for the cardiovascular benefit.  However, given that he is also on glimepiride I will defer to Dr. Wynetta Emery.      Current medicines are reviewed at length with the patient today.  The patient does not have concerns regarding medicines.  The following changes have been made: None  Labs/ tests ordered today include:   Orders Placed This Encounter  Procedures  . Lipid panel  . Comprehensive metabolic panel  . EKG 12-Lead      Disposition:   FU with Soyla Bainter C. Oval Linsey, MD, [facility] in 6 months.      Signed, Vinie Charity C. Oval Linsey, MD, [facility]  04/21/2018 2:15 PM    [facility]

## 2018-04-21 NOTE — Patient Instructions (Addendum)
Medication Instructions:  STOP CLOPIDOGREL   TAKE JUST ASPIRIN 81 MG DAILY   Labwork: FASTING LP/CMET SOON   Testing/Procedures: NONE  Follow-Up: Your physician wants you to follow-up in: 6 MONTHS  You will receive a reminder letter in the mail two months in advance. If you don't receive a letter, please call our office to schedule the follow-up appointment.  If you need a refill on your cardiac medications before your next appointment, please call your pharmacy.

## 2018-04-25 ENCOUNTER — Other Ambulatory Visit: Payer: Self-pay | Admitting: Cardiovascular Disease

## 2018-05-06 ENCOUNTER — Encounter: Payer: Self-pay | Admitting: Pharmacist

## 2018-05-06 ENCOUNTER — Ambulatory Visit: Payer: Medicare HMO | Attending: Family Medicine | Admitting: Pharmacist

## 2018-05-06 DIAGNOSIS — Z833 Family history of diabetes mellitus: Secondary | ICD-10-CM | POA: Insufficient documentation

## 2018-05-06 DIAGNOSIS — Z794 Long term (current) use of insulin: Secondary | ICD-10-CM | POA: Diagnosis not present

## 2018-05-06 DIAGNOSIS — E118 Type 2 diabetes mellitus with unspecified complications: Secondary | ICD-10-CM | POA: Diagnosis not present

## 2018-05-06 DIAGNOSIS — E1165 Type 2 diabetes mellitus with hyperglycemia: Secondary | ICD-10-CM

## 2018-05-06 DIAGNOSIS — Z87891 Personal history of nicotine dependence: Secondary | ICD-10-CM | POA: Diagnosis not present

## 2018-05-06 DIAGNOSIS — Z8249 Family history of ischemic heart disease and other diseases of the circulatory system: Secondary | ICD-10-CM | POA: Insufficient documentation

## 2018-05-06 DIAGNOSIS — IMO0002 Reserved for concepts with insufficient information to code with codable children: Secondary | ICD-10-CM

## 2018-05-06 LAB — GLUCOSE, POCT (MANUAL RESULT ENTRY): POC Glucose: 229 mg/dl — AB (ref 70–99)

## 2018-05-06 MED ORDER — METFORMIN HCL ER 500 MG PO TB24: 1000.0000 mg | ORAL_TABLET | Freq: Two times a day (BID) | ORAL | 6 refills | Status: DC

## 2018-05-06 NOTE — Progress Notes (Signed)
    S:    PCP: Dr. Wynetta Emery  No chief complaint on file.  Patient arrives in good spirits. Presents for diabetes management at the request of Dr. Wynetta Emery. Patient was referred and last seen by her on 04/05/18. Pt was started on Levemir 8 units daily but has not picked this up d/t high insurance co-pay.   Family/Social History:  FH: DM (mother), HTN (Father) Tobacco: former smoker Alcohol: occasional  Insurance coverage/medication affordability:  - [employer] Medicare  Patient denies adherence with medications.  Current diabetes medications include:  - Glimepiride 4 mg daily. Reports not taking. - Metformin XR 500 mg 2 tablets BID. Reports taking 1 tablet daily. - Levemir 8 units daily. Reports not taking.  Patient denies hypoglycemic events.  Patient reported dietary habits: - Patient is not diet compliant and has no interest in adhering to a proper diabetic diet   - "I feel good so why should I watch what I eat?" - "I'm going to eat what I want to eat" Patient-reported exercise habits:  - Pt reports exercising daily   Patient denies nocturia.  Patient denies neuropathy. Patient denies visual changes.  O:  POCT: 229   Pt does not monitor blood sugar at home  Lab Results  Component Value Date   HGBA1C 9.3 (A) 04/05/2018   There were no vitals filed for this visit.  Lipid Panel     Component Value Date/Time   CHOL 143 06/22/2017 0946   TRIG 126 06/22/2017 0946   HDL 37 (L) 06/22/2017 0946   CHOLHDL 3.9 06/22/2017 0946   CHOLHDL 3.7 10/31/2015 1559   VLDL 27 10/31/2015 1559   LDLCALC 81 06/22/2017 0946    Clinical ASCVD: Yes   A/P: Diabetes longstanding currently uncontrolled based on A1c of 9.3. Patient is able to verbalize appropriate hypoglycemia management plan. Patient is not adherent with medication. Control is suboptimal due to medication non-adherence and dietary indescretion.  Insurance issues:  Patient should be on GLP-1 RA for ASCVD benefit. He is  unable to pick up insulin d/t cost of medication. His copay was $142.00. Same copay with Victoza, Jardiance, and any other basal insulin. [employer]. Pt must meet remaining deductible of $95.00 and copays will decrease to $47.00 for the above mentioned prescriptions (1 month supplies). He is not willing to pay at this time.   Non-adherence: We had a lengthy discussion concerning medication adherence and control of diabetes. He insists that he feels fine and doesn't feel a need for changing his lifestyle or medication habits. Reinforced that he is a high risk patient given ASCVD history + DM. He is amenable, at least, to taking metformin as prescribed. He does not have glimepiride and reports not taking this in some time.   -Increased dose of metformin XR to 500 mg BID. -Extensively discussed pathophysiology of DM, recommended lifestyle interventions, dietary effects on glycemic control -Counseled on s/sx of and management of hypoglycemia -Next A1C anticipated 06/2018.   ASCVD risk - secondary prevention in patient with DM. Last LDL >70. High intensity statin indicated. Aspirin is indicated.  -Continued aspirin 81 mg  -Continued aotrvastatin 40 mg.   Written patient instructions provided.  Total time in face to face counseling 15 minutes.   Follow up PCP Clinic Visit 07/06/18.     Patient seen with Marylene Buerger, PharmD Candidate [employer]  Benard Halsted, PharmD, [place] [phone]

## 2018-05-06 NOTE — Patient Instructions (Addendum)
Thank you for coming to see me today. Please do the following:  1. Take metformin XR 500 mg 2 tablets twice daily. 2. Continue checking blood sugars at home.  3. Continue making the lifestyle changes we've discussed together during our visit. Diet and exercise play a significant role in improving your blood sugars.  4. Follow-up with Dr. Wynetta Emery 07/06/18.    Hypoglycemia or low blood sugar:   Low blood sugar can happen quickly and may become an emergency if not treated right away.   While this shouldn't happen often, it can be brought upon if you skip a meal or do not eat enough. Also, if your insulin or other diabetes medications are dosed too high, this can cause your blood sugar to go to low.   Warning signs of low blood sugar include: 1. Feeling shaky or dizzy 2. Feeling weak or tired  3. Excessive hunger 4. Feeling anxious or upset  5. Sweating even when you aren't exercising  What to do if I experience low blood sugar? 1. Check your blood sugar with your meter. If lower than 70, proceed to step 2.  2. Treat with 3-4 glucose tablets or 3 packets of regular sugar. If these aren't around, you can try hard candy. Yet another option would be to drink 4 ounces of fruit juice or 6 ounces of REGULAR soda.  3. Re-check your sugar in 15 minutes. If it is still below 70, do what you did in step 2 again. If has come back up, go ahead and eat a snack or small meal at this time.

## 2018-07-06 ENCOUNTER — Encounter: Payer: Self-pay | Admitting: Internal Medicine

## 2018-07-06 ENCOUNTER — Ambulatory Visit: Payer: Medicare HMO | Attending: Internal Medicine | Admitting: Internal Medicine

## 2018-07-06 VITALS — BP 172/93 | HR 73 | Temp 97.9°F | Resp 16 | Wt 202.0 lb

## 2018-07-06 DIAGNOSIS — Z7901 Long term (current) use of anticoagulants: Secondary | ICD-10-CM | POA: Diagnosis not present

## 2018-07-06 DIAGNOSIS — Z9119 Patient's noncompliance with other medical treatment and regimen: Secondary | ICD-10-CM

## 2018-07-06 DIAGNOSIS — F17290 Nicotine dependence, other tobacco product, uncomplicated: Secondary | ICD-10-CM | POA: Insufficient documentation

## 2018-07-06 DIAGNOSIS — I10 Essential (primary) hypertension: Secondary | ICD-10-CM

## 2018-07-06 DIAGNOSIS — Z23 Encounter for immunization: Secondary | ICD-10-CM | POA: Diagnosis not present

## 2018-07-06 DIAGNOSIS — N189 Chronic kidney disease, unspecified: Secondary | ICD-10-CM | POA: Insufficient documentation

## 2018-07-06 DIAGNOSIS — E1122 Type 2 diabetes mellitus with diabetic chronic kidney disease: Secondary | ICD-10-CM | POA: Insufficient documentation

## 2018-07-06 DIAGNOSIS — Z7982 Long term (current) use of aspirin: Secondary | ICD-10-CM | POA: Insufficient documentation

## 2018-07-06 DIAGNOSIS — Z955 Presence of coronary angioplasty implant and graft: Secondary | ICD-10-CM | POA: Insufficient documentation

## 2018-07-06 DIAGNOSIS — Z79899 Other long term (current) drug therapy: Secondary | ICD-10-CM | POA: Insufficient documentation

## 2018-07-06 DIAGNOSIS — E118 Type 2 diabetes mellitus with unspecified complications: Secondary | ICD-10-CM

## 2018-07-06 DIAGNOSIS — Z794 Long term (current) use of insulin: Secondary | ICD-10-CM | POA: Insufficient documentation

## 2018-07-06 DIAGNOSIS — I129 Hypertensive chronic kidney disease with stage 1 through stage 4 chronic kidney disease, or unspecified chronic kidney disease: Secondary | ICD-10-CM | POA: Insufficient documentation

## 2018-07-06 DIAGNOSIS — I2510 Atherosclerotic heart disease of native coronary artery without angina pectoris: Secondary | ICD-10-CM | POA: Diagnosis not present

## 2018-07-06 DIAGNOSIS — R69 Illness, unspecified: Secondary | ICD-10-CM | POA: Diagnosis not present

## 2018-07-06 DIAGNOSIS — IMO0002 Reserved for concepts with insufficient information to code with codable children: Secondary | ICD-10-CM

## 2018-07-06 DIAGNOSIS — E1165 Type 2 diabetes mellitus with hyperglycemia: Secondary | ICD-10-CM | POA: Diagnosis not present

## 2018-07-06 DIAGNOSIS — Z9049 Acquired absence of other specified parts of digestive tract: Secondary | ICD-10-CM | POA: Insufficient documentation

## 2018-07-06 DIAGNOSIS — Z91199 Patient's noncompliance with other medical treatment and regimen due to unspecified reason: Secondary | ICD-10-CM

## 2018-07-06 LAB — POCT GLYCOSYLATED HEMOGLOBIN (HGB A1C): HbA1c, POC (controlled diabetic range): 8.3 % — AB (ref 0.0–7.0)

## 2018-07-06 LAB — GLUCOSE, POCT (MANUAL RESULT ENTRY): POC Glucose: 253 mg/dl — AB (ref 70–99)

## 2018-07-06 MED ORDER — DAPAGLIFLOZIN PROPANEDIOL 5 MG PO TABS: 5.0000 mg | ORAL_TABLET | Freq: Every day | ORAL | 3 refills | Status: DC

## 2018-07-06 MED ORDER — TETANUS-DIPHTH-ACELL PERTUSSIS 5-2.5-18.5 LF-MCG/0.5 IM SUSP: 0.5000 mL | INTRAMUSCULAR | 0 refills | Status: DC

## 2018-07-06 NOTE — Patient Instructions (Addendum)
Your blood pressure is not well controlled.  Please take your medicines every day as prescribed.  The 3 months level call the A1c for the diabetes has improved by one-point but still not at goal.  Continue taking metformin.  We have added a new diabetes pill called [place].  Fall Prevention in the Home Falls can cause injuries. They can happen to people of all ages. There are many things you can do to make your home safe and to help prevent falls. What can I do on the outside of my home?  Regularly fix the edges of walkways and driveways and fix any cracks.  Remove anything that might make you trip as you walk through a door, such as a raised step or threshold.  Trim any bushes or trees on the path to your home.  Use bright outdoor lighting.  Clear any walking paths of anything that might make someone trip, such as rocks or tools.  Regularly check to see if handrails are loose or broken. Make sure that both sides of any steps have handrails.  Any raised decks and porches should have guardrails on the edges.  Have any leaves, snow, or ice cleared regularly.  Use sand or salt on walking paths during winter.  Clean up any spills in your garage right away. This includes oil or grease spills. What can I do in the bathroom?  Use night lights.  Install grab bars by the toilet and in the tub and shower. Do not use towel bars as grab bars.  Use non-skid mats or decals in the tub or shower.  If you need to sit down in the shower, use a plastic, non-slip stool.  Keep the floor dry. Clean up any water that spills on the floor as soon as it happens.  Remove soap buildup in the tub or shower regularly.  Attach bath mats securely with double-sided non-slip rug tape.  Do not have throw rugs and other things on the floor that can make you trip. What can I do in the bedroom?  Use night lights.  Make sure that you have a light by your bed that is easy to reach.  Do not use any sheets  or blankets that are too big for your bed. They should not hang down onto the floor.  Have a firm chair that has side arms. You can use this for support while you get dressed.  Do not have throw rugs and other things on the floor that can make you trip. What can I do in the kitchen?  Clean up any spills right away.  Avoid walking on wet floors.  Keep items that you use a lot in easy-to-reach places.  If you need to reach something above you, use a strong step stool that has a grab bar.  Keep electrical cords out of the way.  Do not use floor polish or wax that makes floors slippery. If you must use wax, use non-skid floor wax.  Do not have throw rugs and other things on the floor that can make you trip. What can I do with my stairs?  Do not leave any items on the stairs.  Make sure that there are handrails on both sides of the stairs and use them. Fix handrails that are broken or loose. Make sure that handrails are as long as the stairways.  Check any carpeting to make sure that it is firmly attached to the stairs. Fix any carpet that is loose or worn.  Avoid having throw rugs at the top or bottom of the stairs. If you do have throw rugs, attach them to the floor with carpet tape.  Make sure that you have a light switch at the top of the stairs and the bottom of the stairs. If you do not have them, ask someone to add them for you. What else can I do to help prevent falls?  Wear shoes that: ? Do not have high heels. ? Have rubber bottoms. ? Are comfortable and fit you well. ? Are closed at the toe. Do not wear sandals.  If you use a stepladder: ? Make sure that it is fully opened. Do not climb a closed stepladder. ? Make sure that both sides of the stepladder are locked into place. ? Ask someone to hold it for you, if possible.  Clearly mark and make sure that you can see: ? Any grab bars or handrails. ? First and last steps. ? Where the edge of each step is.  Use  tools that help you move around (mobility aids) if they are needed. These include: ? Canes. ? Walkers. ? Scooters. ? Crutches.  Turn on the lights when you go into a dark area. Replace any light bulbs as soon as they burn out.  Set up your furniture so you have a clear path. Avoid moving your furniture around.  If any of your floors are uneven, fix them.  If there are any pets around you, be aware of where they are.  Review your medicines with your doctor. Some medicines can make you feel dizzy. This can increase your chance of falling. Ask your doctor what other things that you can do to help prevent falls. This information is not intended to replace advice given to you by your health care provider. Make sure you discuss any questions you have with your health care provider. Document Released: 05/31/2009 Document Revised: 01/10/2016 Document Reviewed: 09/08/2014 Elsevier Interactive Patient Education  2018 [place] (Tetanus and Diphtheria): What You Need to Know 1. Why get vaccinated? Tetanus  and diphtheria are very serious diseases. They are rare in the [place] today, but people who do become infected often have severe complications. Td vaccine is used to protect adolescents and adults from both of these diseases. Both tetanus and diphtheria are infections caused by bacteria. Diphtheria spreads from person to person through coughing or sneezing. Tetanus-causing bacteria enter the body through cuts, scratches, or wounds. TETANUS (lockjaw) causes painful muscle tightening and stiffness, usually all over the body.  It can lead to tightening of muscles in the head and neck so you can't open your mouth, swallow, or sometimes even breathe. Tetanus kills about 1 out of every 10 people who are infected even after receiving the best medical care.  DIPHTHERIA can cause a thick coating to form in the back of the throat.  It can lead to breathing problems, paralysis,  heart failure, and death.  Before vaccines, as many as 200,000 cases of diphtheria and hundreds of cases of tetanus were reported in the [place] each year. Since vaccination began, reports of cases for both diseases have dropped by about 99%. 2. Td vaccine Td vaccine can protect adolescents and adults from tetanus and diphtheria. Td is usually given as a booster dose every 10 years but it can also be given earlier after a severe and dirty wound or burn. Another vaccine, called Tdap, which protects against pertussis in addition to tetanus and  diphtheria, is sometimes recommended instead of Td vaccine. Your doctor or the person giving you the vaccine can give you more information. Td may safely be given at the same time as other vaccines. 3. Some people should not get this vaccine  A person who has ever had a life-threatening allergic reaction after a previous dose of any tetanus or diphtheria containing vaccine, OR has a severe allergy to any part of this vaccine, should not get Td vaccine. Tell the person giving the vaccine about any severe allergies.  Talk to your doctor if you: ? had severe pain or swelling after any vaccine containing diphtheria or tetanus, ? ever had a condition called Guillain Barre Syndrome (GBS), ? aren't feeling well on the day the shot is scheduled. 4. What are the risks from Td vaccine? With any medicine, including vaccines, there is a chance of side effects. These are usually mild and go away on their own. Serious reactions are also possible but are rare. Most people who get Td vaccine do not have any problems with it. Mild problems following Td vaccine: (Did not interfere with activities)  Pain where the shot was given (about 8 people in 10)  Redness or swelling where the shot was given (about 1 person in 4)  Mild fever (rare)  Headache (about 1 person in 4)  Tiredness (about 1 person in 4)  Moderate problems following Td vaccine: (Interfered with  activities, but did not require medical attention)  Fever over 102F (rare)  Severe problems following Td vaccine: (Unable to perform usual activities; required medical attention)  Swelling, severe pain, bleeding and/or redness in the arm where the shot was given (rare).  Problems that could happen after any vaccine:  People sometimes faint after a medical procedure, including vaccination. Sitting or lying down for about 15 minutes can help prevent fainting, and injuries caused by a fall. Tell your doctor if you feel dizzy, or have vision changes or ringing in the ears.  Some people get severe pain in the shoulder and have difficulty moving the arm where a shot was given. This happens very rarely.  Any medication can cause a severe allergic reaction. Such reactions from a vaccine are very rare, estimated at fewer than 1 in a million doses, and would happen within a few minutes to a few hours after the vaccination. As with any medicine, there is a very remote chance of a vaccine causing a serious injury or death. The safety of vaccines is always being monitored. For more information, visit: http://www.aguilar.org/ 5. What if there is a serious reaction? What should I look for? Look for anything that concerns you, such as signs of a severe allergic reaction, very high fever, or unusual behavior. Signs of a severe allergic reaction can include hives, swelling of the face and throat, difficulty breathing, a fast heartbeat, dizziness, and weakness. These would usually start a few minutes to a few hours after the vaccination. What should I do?  If you think it is a severe allergic reaction or other emergency that can't wait, call 9-1-1 or get the person to the nearest hospital. Otherwise, call your doctor.  Afterward, the reaction should be reported to the Vaccine Adverse Event Reporting System (VAERS). Your doctor might file this report, or you can do it yourself through the VAERS web site at  www.vaers.SamedayNews.es, or by calling 2346455143. ? VAERS does not give medical advice. 6. The National Vaccine Injury Compensation Program The [employer] Vaccine Injury Compensation Program ([place]) is  a federal program that was created to compensate people who may have been injured by certain vaccines. Persons who believe they may have been injured by a vaccine can learn about the program and about filing a claim by calling 917-755-1411 or visiting the [place] website at GoldCloset.com.ee. There is a time limit to file a claim for compensation. 7. How can I learn more?  Ask your doctor. He or she can give you the vaccine package insert or suggest other sources of information.  Call your local or state health department.  Contact the Centers for Disease Control and Prevention (CDC): ? Call (234) 151-9170 (1-800-CDC-INFO) ? Visit CDC's website at http://hunter.com/ CDC Td Vaccine VIS (11/27/15) This information is not intended to replace advice given to you by your health care provider. Make sure you discuss any questions you have with your health care provider. Document Released: 06/01/2006 Document Revised: 04/24/2016 Document Reviewed: 04/24/2016 Elsevier Interactive Patient Education  2017 [employer].

## 2018-07-06 NOTE — Progress Notes (Signed)
Patient ID: Paarth Cropper, male    DOB: 30-Jun-1946  MRN: [id number]  CC: Diabetes   Subjective: Alferd Obryant is a 72 y.o. male who presents for chronic disease management. His concerns today include:  Pt with hx of IDA due to occult small bowel bleedingwith resection of benign neuroendocrine tumor 06/2017, CAD with hx of PCI, PVD, CKD, HTN, HL, DM type 2  DM:  Not checking BS consistently.  "Its up and down."   No numbness in hands/feet "but it does feel a little funny sometimes."  He stays active by walking his dog daily.  This is also hunting season for him and he goes hunting several times a month. Not taking Amaryl and not sure why.  It has fallen off of his med list.  Metformin inc to 1 gram daily by clinical pharmacist  2 mths ago.  Reports compliance with taking the metformin daily.  Never got the Lantus insulin due to high co-pay  CAD/HTN:  Takes Crestor occasionally. Reports soreness in his stomach "when I take a handful of pills in the mornings."   Admits that he does not take all of his pills every day.  "I do not feel like I need to take them every day."  He did not take all of his meds as yet this a.m -Some SOB 3 days ago when he walked up a hill hunting.  No chest pains.  No LE edema  Patient Active Problem List   Diagnosis Date Noted  . Benign neuroendocrine tumor of small intestine 09/02/2017  . Chronic anticoagulation 06/28/2017  . Iron deficiency anemia due to chronic blood loss 06/26/2017  . Hypertensive cardiovascular disease 06/25/2017  . PVC's (premature ventricular contractions) 01/26/2017  . CAD S/P percutaneous coronary angioplasty 10/03/2016  . Anemia due to chronic blood loss 02/28/2016  . Chest pain with high risk for cardiac etiology 04/22/2015  . Claudication in peripheral vascular disease ([place]) 04/22/2015  . Anemia, iron deficiency 10/17/2014  . Essential hypertension 10/17/2014  . DM2 (diabetes mellitus, type 2) ([place]) 04/08/2012  . PUD (peptic  ulcer disease) 11/11/2011     Current Outpatient Medications on File Prior to Visit  Medication Sig Dispense Refill  . amLODipine (NORVASC) 10 MG tablet TAKE 1 TABLET BY MOUTH EVERY DAY 90 tablet 1  . aspirin 81 MG tablet Take 1 tablet (81 mg total) by mouth daily. 30 tablet 11  . bisoprolol (ZEBETA) 10 MG tablet Take 10 mg by mouth daily.    . chlorthalidone (HYGROTON) 25 MG tablet TAKE 1 TABLET BY MOUTH EVERY DAY 90 tablet 1  . cloNIDine (CATAPRES) 0.1 MG tablet 1/2 tab PO BID 30 tablet 3  . ferrous sulfate 325 (65 FE) MG EC tablet Take 1 tablet (325 mg total) by mouth 2 (two) times daily with a meal. (Patient taking differently: Take 325 mg by mouth daily with breakfast. ) 60 tablet 5  . Insulin Pen Needle (PEN NEEDLES) 31G X 6 MM MISC Use as directed 100 each 6  . isosorbide mononitrate (IMDUR) 30 MG 24 hr tablet Take 1 tablet (30 mg total) daily by mouth. 30 tablet 3  . metFORMIN (GLUCOPHAGE-XR) 500 MG 24 hr tablet Take 2 tablets (1,000 mg total) by mouth 2 (two) times daily. 120 tablet 6  . polyethylene glycol (MIRALAX) packet Take 17 g daily as needed by mouth for mild constipation. 14 each 0  . rosuvastatin (CRESTOR) 20 MG tablet Take 1 tablet (20 mg total) by mouth daily. [place]  tablet 5  . rosuvastatin (CRESTOR) 20 MG tablet TAKE 1 TABLET BY MOUTH EVERY DAY 90 tablet 1   No current facility-administered medications on file prior to visit.     No Known Allergies  Social History   Socioeconomic History  . Marital status: Married    Spouse name: Not on file  . Number of children: Not on file  . Years of education: Not on file  . Highest education level: Not on file  Occupational History  . Not on file  Social Needs  . Financial resource strain: Not on file  . Food insecurity:    Worry: Not on file    Inability: Not on file  . Transportation needs:    Medical: Not on file    Non-medical: Not on file  Tobacco Use  . Smoking status: Former Smoker    Packs/day: 1.00     Years: 54.00    Pack years: 54.00    Types: Cigars, Cigarettes    Last attempt to quit: 02/16/2015    Years since quitting: 3.3  . Smokeless tobacco: Former Systems developer    Types: Chew  . Tobacco comment: "chewed some when I was young"  Substance and Sexual Activity  . Alcohol use: No    Alcohol/week: 0.0 standard drinks    Comment: 04/24/2015 "no beer in 2 wks; ;no liquor in 3-4 weeks; I'm not a real heavy drinker" states he stopped drining 2 yrs ago  . Drug use: No  . Sexual activity: Never  Lifestyle  . Physical activity:    Days per week: Not on file    Minutes per session: Not on file  . Stress: Not on file  Relationships  . Social connections:    Talks on phone: Not on file    Gets together: Not on file    Attends religious service: Not on file    Active member of club or organization: Not on file    Attends meetings of clubs or organizations: Not on file    Relationship status: Not on file  . Intimate partner violence:    Fear of current or ex partner: Not on file    Emotionally abused: Not on file    Physically abused: Not on file    Forced sexual activity: Not on file  Other Topics Concern  . Not on file  Social History Narrative  . Not on file    Family History  Problem Relation Age of Onset  . CVA Unknown   . Hypertension Unknown   . Diabetes Mother   . Hypertension Father   . Peripheral Artery Disease Father   . Alcohol abuse Father   . Heart attack Father     Past Surgical History:  Procedure Laterality Date  . CARDIAC CATHETERIZATION N/A 04/24/2015   Procedure: Left Heart Cath and Coronary Angiography;  Surgeon: [name], MD;  Location: [facility];  Service: Cardiovascular;  Laterality: N/A;  . CARDIAC CATHETERIZATION N/A 04/24/2015   Procedure: Coronary Stent Intervention;  Surgeon: [name], MD;  Location: [facility];  Service: Cardiovascular;  Laterality: N/A;  Prox RCA  . CARDIAC CATHETERIZATION Right 05/29/2015   Procedure: Angioplasty  Perioneal and SFA;  Surgeon: [name], MD;  Location: [facility];  Service: Cardiovascular;  Laterality: Right;  . COLONOSCOPY Left 02/29/2016   Procedure: COLONOSCOPY;  Surgeon: Carol Ada, MD;  Location: [facility];  Service: Endoscopy;  Laterality: Left;  . COLONOSCOPY WITH PROPOFOL N/A 11/23/2015  Procedure: COLONOSCOPY WITH PROPOFOL;  Surgeon: Carol Ada, MD;  Location: [facility];  Service: Endoscopy;  Laterality: N/A;  . CORONARY ANGIOPLASTY    . ESOPHAGOGASTRODUODENOSCOPY (EGD) WITH PROPOFOL N/A 11/23/2015   Procedure: ESOPHAGOGASTRODUODENOSCOPY (EGD) WITH PROPOFOL;  Surgeon: Carol Ada, MD;  [facility]: [facility];  Service: Endoscopy;  Laterality: N/A;  . EXCISIONAL HEMORRHOIDECTOMY  1980's  . GIVENS CAPSULE STUDY N/A 02/29/2016   Procedure: GIVENS CAPSULE STUDY;  Surgeon: Carol Ada, MD;  Location: [facility];  Service: Endoscopy;  Laterality: N/A;  . LAPAROSCOPIC SMALL BOWEL RESECTION N/A 06/30/2017   Procedure: LAPAROSCOPIC SMALL BOWEL RESECTION;  Surgeon: Leighton Ruff, MD;  Location: [facility];  Service: General;  Laterality: N/A;  . PERIPHERAL VASCULAR CATHETERIZATION N/A 04/24/2015   Procedure: Lower Extremity Angiography;  Surgeon: [name], MD;  Location: [facility];  Service: Cardiovascular;  Laterality: N/A;  . PERIPHERAL VASCULAR CATHETERIZATION N/A 05/29/2015   Procedure: Lower Extremity Angiography;  Surgeon: [name], MD;  Location: [facility];  Service: Cardiovascular;  Laterality: N/A;  . PERIPHERAL VASCULAR CATHETERIZATION Left 05/29/2015   Procedure: Renal Angiography;  Surgeon: [name], MD;  Location: [facility];  Service: Cardiovascular;  Laterality: Left;  . PERIPHERAL VASCULAR CATHETERIZATION Left 06/12/2015   Procedure: Peripheral Vascular Atherectomy;  Surgeon: [name], MD;  Location: [facility];  Service: Cardiovascular;  Laterality: Left;  AT  . PERIPHERAL VASCULAR CATHETERIZATION Left 06/12/2015    Procedure: Peripheral Vascular Intervention;  Surgeon: [name], MD;  Location: [facility];  Service: Cardiovascular;  Laterality: Left;  PTA TP TRUNK  . UPPER GI ENDOSCOPY  12/16/11   findings:  mild gastritis and duodenitis    ROS: Review of Systems Negative except as above PHYSICAL EXAM: BP (!) 172/93   Pulse 73   Temp 97.9 F (36.6 C) (Oral)   Resp 16   Wt 202 lb (91.6 kg)   SpO2 100%   BMI 30.71 kg/m   Physical Exam General appearance - alert, well appearing, and in no distress Mental status - normal mood, behavior, speech, dress, motor activity, and thought processes Neck - supple, no significant adenopathy Chest - clear to auscultation, no wheezes, rales or rhonchi, symmetric air entry Heart - normal rate, regular rhythm, normal S1, S2, no murmurs, rubs, clicks or gallops Extremities - peripheral pulses normal, no pedal edema, no clubbing or cyanosis Diabetic Foot Exam - Simple   Simple Foot Form Visual Inspection Sensation Testing Intact to touch and monofilament testing bilaterally:  Yes Pulse Check Posterior Tibialis and Dorsalis pulse intact bilaterally:  Yes Comments     A1C 8.3/BS 253  ASSESSMENT AND PLAN:  1. Diabetes mellitus type 2, uncontrolled, with complications ([place]) Continue regular exercise.  Dietary counseling discussed and encouraged. Continue metformin.  He is no longer taking Amaryl.  I recommended trying [place]. - POCT glucose (manual entry) - POCT glycosylated hemoglobin (Hb A1C) - dapagliflozin propanediol (FARXIGA) 5 MG TABS tablet; Take 5 mg by mouth daily.  Dispense: 30 tablet; Refill: 3 - Comprehensive metabolic panel - Lipid panel  2. Essential hypertension Not at goal.  Strongly encourage compliance with medications.  Discussed cardiovascular risks associated with uncontrolled blood pressure.  3. Coronary artery disease involving native coronary artery of native heart without angina pectoris Clinically stable.   Encourage compliance with medications and statin.  Advised that he can take the statin at bedtime since he does not like taking all of his medications at once  4. Need  for Tdap vaccination   5. Medically noncompliant   Patient was given the opportunity to ask questions.  Patient verbalized understanding of the plan and was able to repeat key elements of the plan.   Orders Placed This Encounter  Procedures  . Tdap vaccine greater than or equal to 7yo IM  . Comprehensive metabolic panel  . Lipid panel  . POCT glucose (manual entry)  . POCT glycosylated hemoglobin (Hb A1C)     Requested Prescriptions   Signed Prescriptions Disp Refills  . Tdap (BOOSTRIX) 5-2.5-18.5 LF-MCG/0.5 injection 0.5 mL 0    Sig: Inject 0.5 mLs into the muscle as directed.  . dapagliflozin propanediol (FARXIGA) 5 MG TABS tablet 30 tablet 3    Sig: Take 5 mg by mouth daily.    Return in about 4 months (around 11/04/2018).  Karle Plumber, MD, FACP

## 2018-07-06 NOTE — Progress Notes (Signed)
[id number] a1c-8.3

## 2018-07-07 LAB — COMPREHENSIVE METABOLIC PANEL
ALT: 15 IU/L (ref 0–44)
AST: 17 IU/L (ref 0–40)
Albumin/Globulin Ratio: 1.5 (ref 1.2–2.2)
Albumin: 4.6 g/dL (ref 3.5–4.8)
Alkaline Phosphatase: 55 IU/L (ref 39–117)
BUN/Creatinine Ratio: 10 (ref 10–24)
BUN: 15 mg/dL (ref 8–27)
Bilirubin Total: 0.5 mg/dL (ref 0.0–1.2)
CO2: 22 mmol/L (ref 20–29)
Calcium: 9.8 mg/dL (ref 8.6–10.2)
Chloride: 97 mmol/L (ref 96–106)
Creatinine, Ser: 1.45 mg/dL — ABNORMAL HIGH (ref 0.76–1.27)
GFR calc Af Amer: 55 mL/min/{1.73_m2} — ABNORMAL LOW (ref >59)
GFR calc non Af Amer: 47 mL/min/{1.73_m2} — ABNORMAL LOW (ref >59)
Globulin, Total: 3.1 g/dL (ref 1.5–4.5)
Glucose: 216 mg/dL — ABNORMAL HIGH (ref 65–99)
Potassium: 4.2 mmol/L (ref 3.5–5.2)
Sodium: 136 mmol/L (ref 134–144)
Total Protein: 7.7 g/dL (ref 6.0–8.5)

## 2018-07-07 LAB — LIPID PANEL
Chol/HDL Ratio: 3.6 ratio (ref 0.0–5.0)
Cholesterol, Total: 171 mg/dL (ref 100–199)
HDL: 47 mg/dL (ref >39)
LDL Calculated: 98 mg/dL (ref 0–99)
Triglycerides: 128 mg/dL (ref 0–149)
VLDL Cholesterol Cal: 26 mg/dL (ref 5–40)

## 2018-07-23 IMAGING — CR DG CHEST 2V
2 series · 2 of 2 positions shown · non-contrast
Comparison: Prior radiograph from 02/16/2016.

CLINICAL DATA: Initial evaluation for acute chest pain.

EXAM:
CHEST  2 VIEW

[w chest lat]
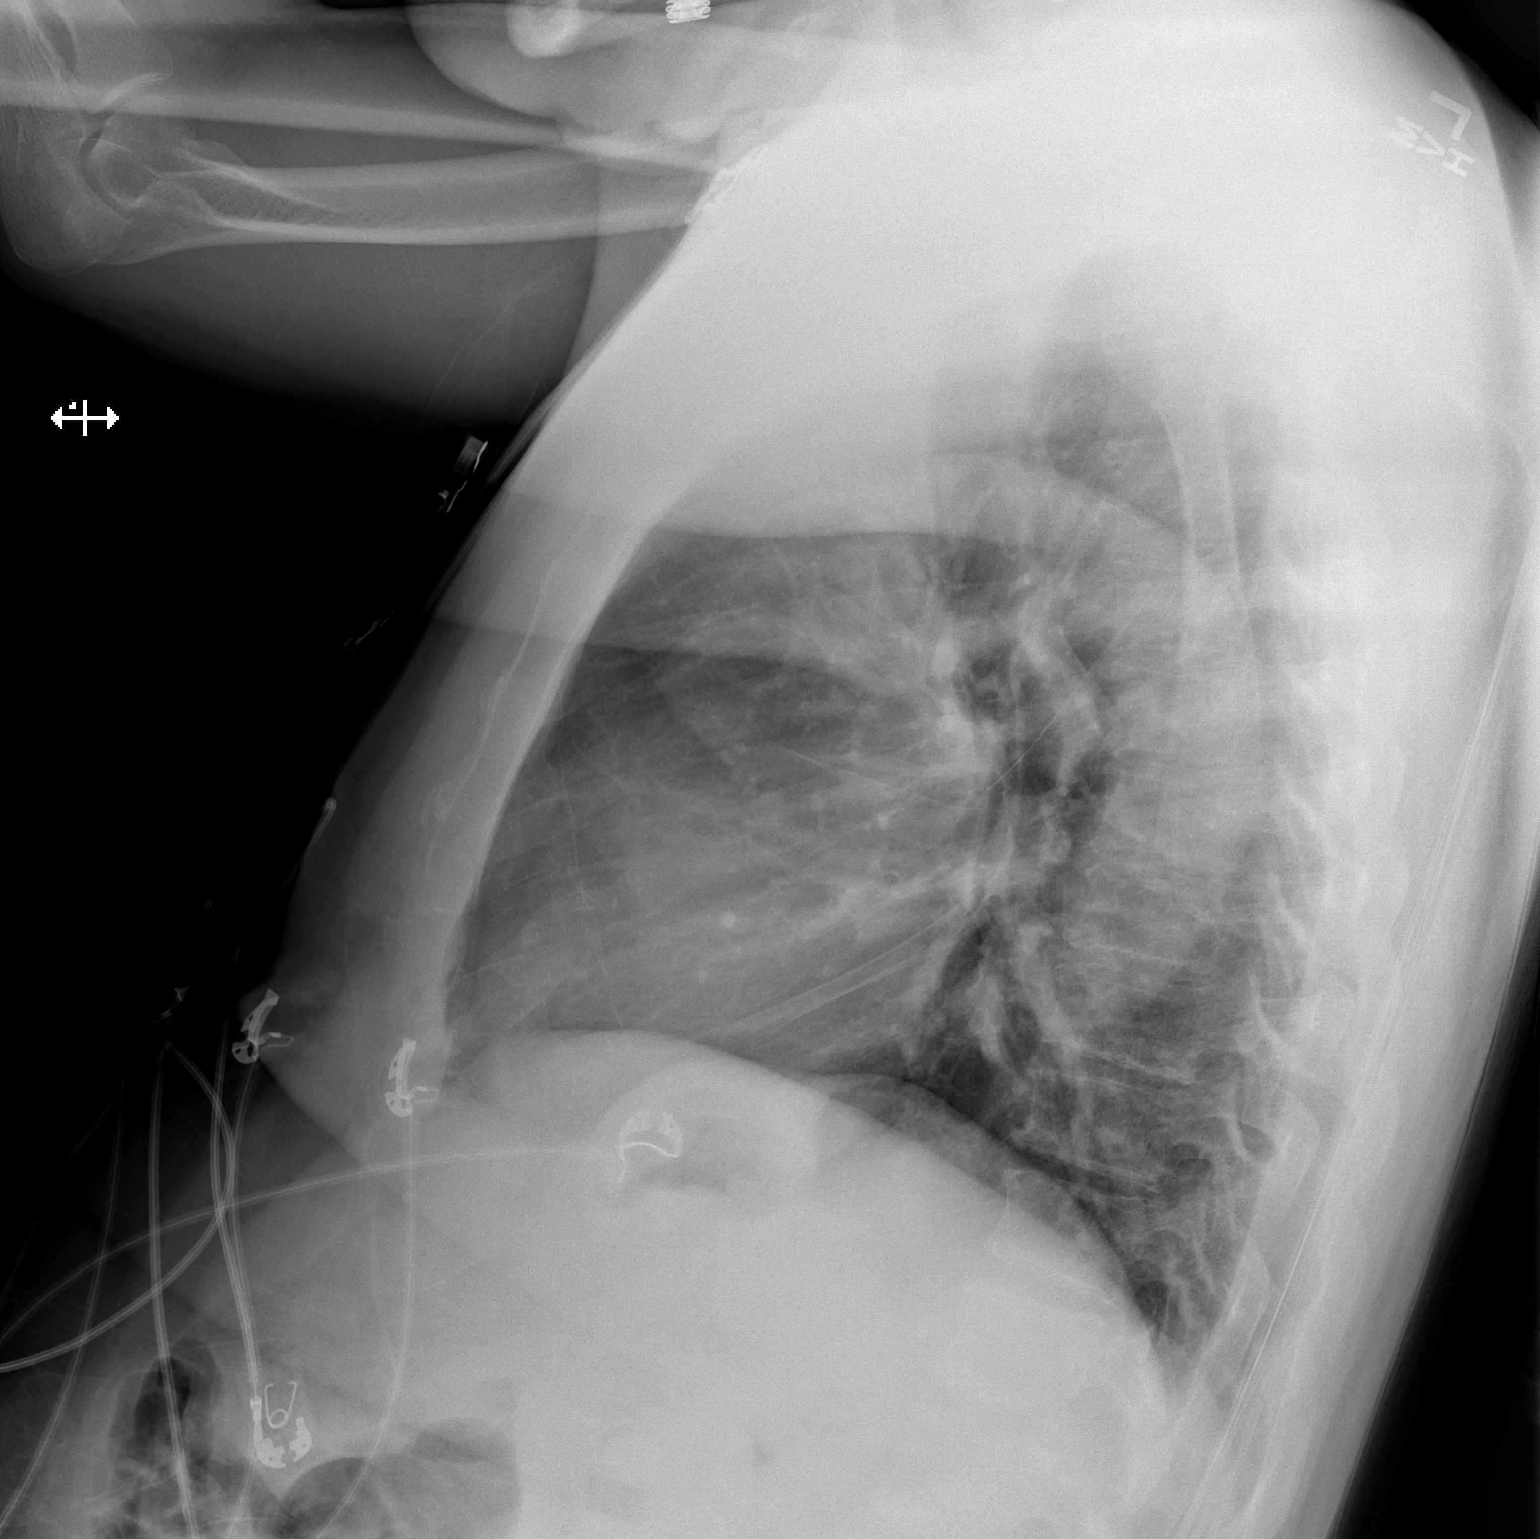

[x chest ap]
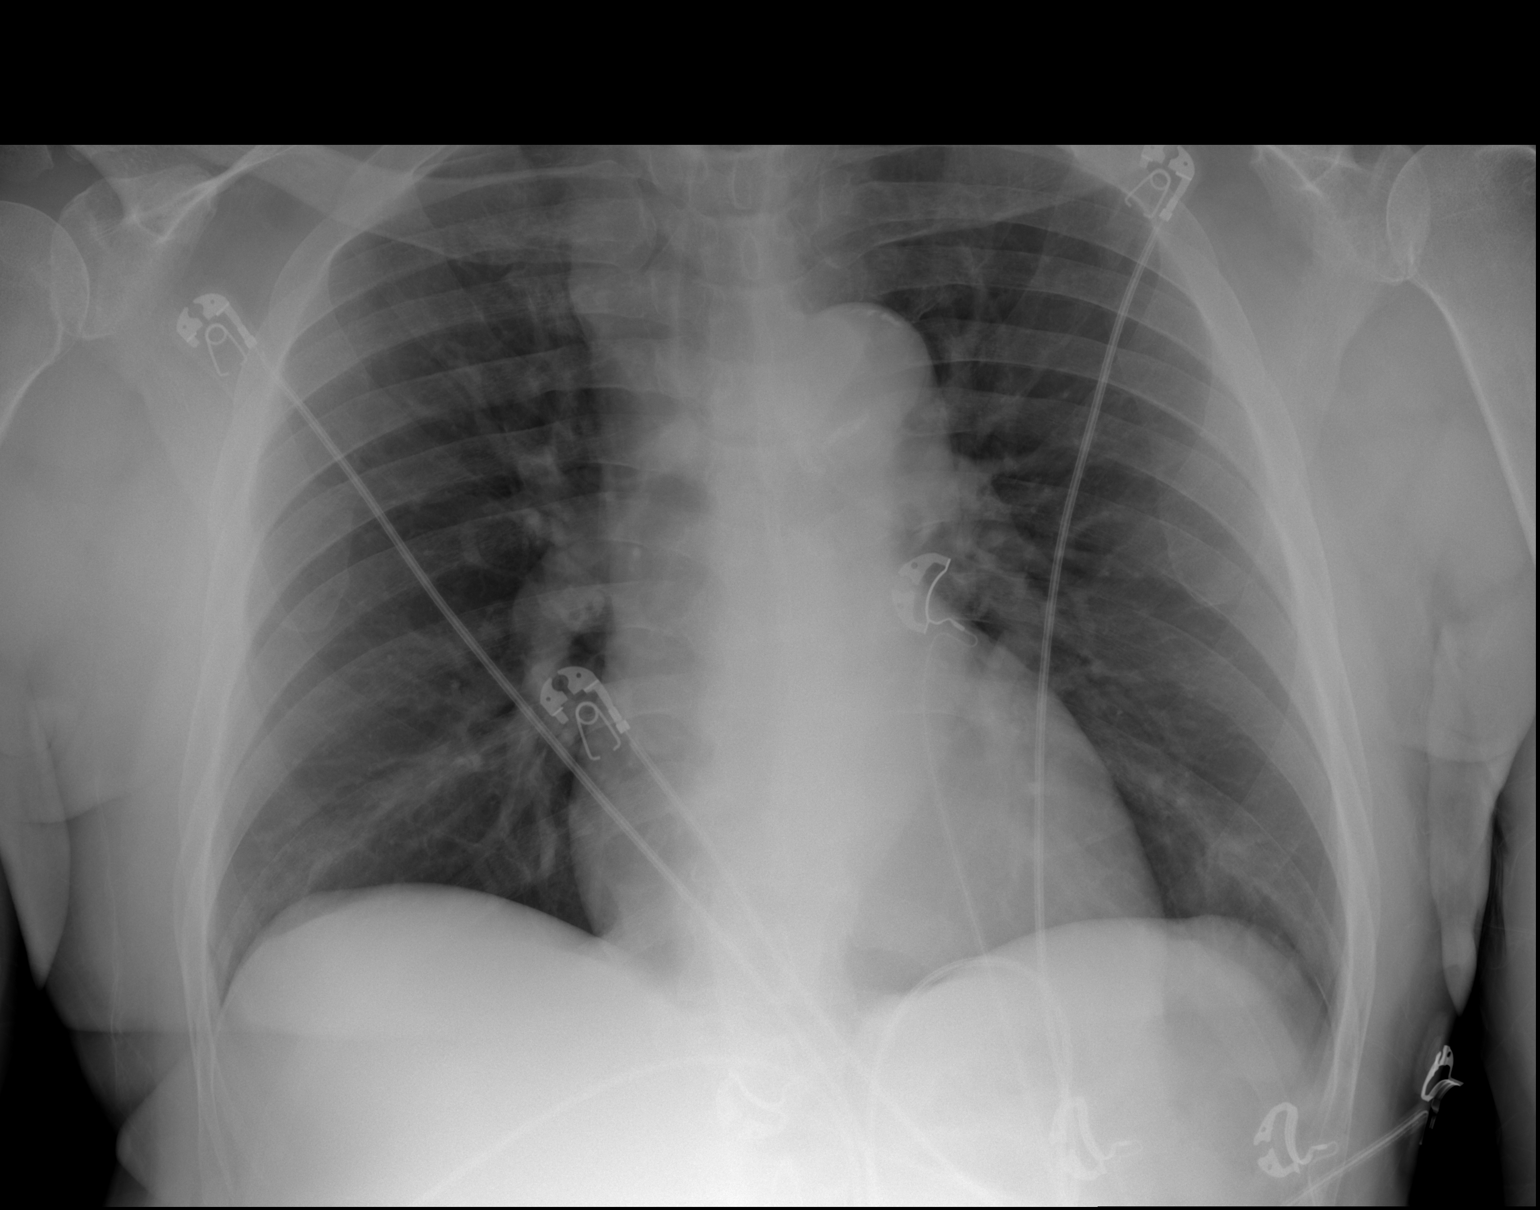

[2 of 2 positions shown; findings below may reference images not displayed]

FINDINGS: Mild cardiomegaly, stable. Mediastinal silhouette normal. Aortic
atherosclerosis.

The lungs are normally inflated. No airspace consolidation, pleural
effusion, or pulmonary edema is identified. There is no
pneumothorax.

No acute osseous abnormality identified.
IMPRESSION: 1. No active cardiopulmonary disease.
2. Mild cardiomegaly, stable.
3. Aortic atherosclerosis.

## 2018-07-27 IMAGING — DX DG ABD PORTABLE 1V
1 series · 1 of 1 positions shown · non-contrast
Comparison: CT scan of the abdomen June 27, 2017

CLINICAL DATA: Nasogastric tube placement

EXAM:
PORTABLE ABDOMEN - 1 VIEW

[abdomen kub]
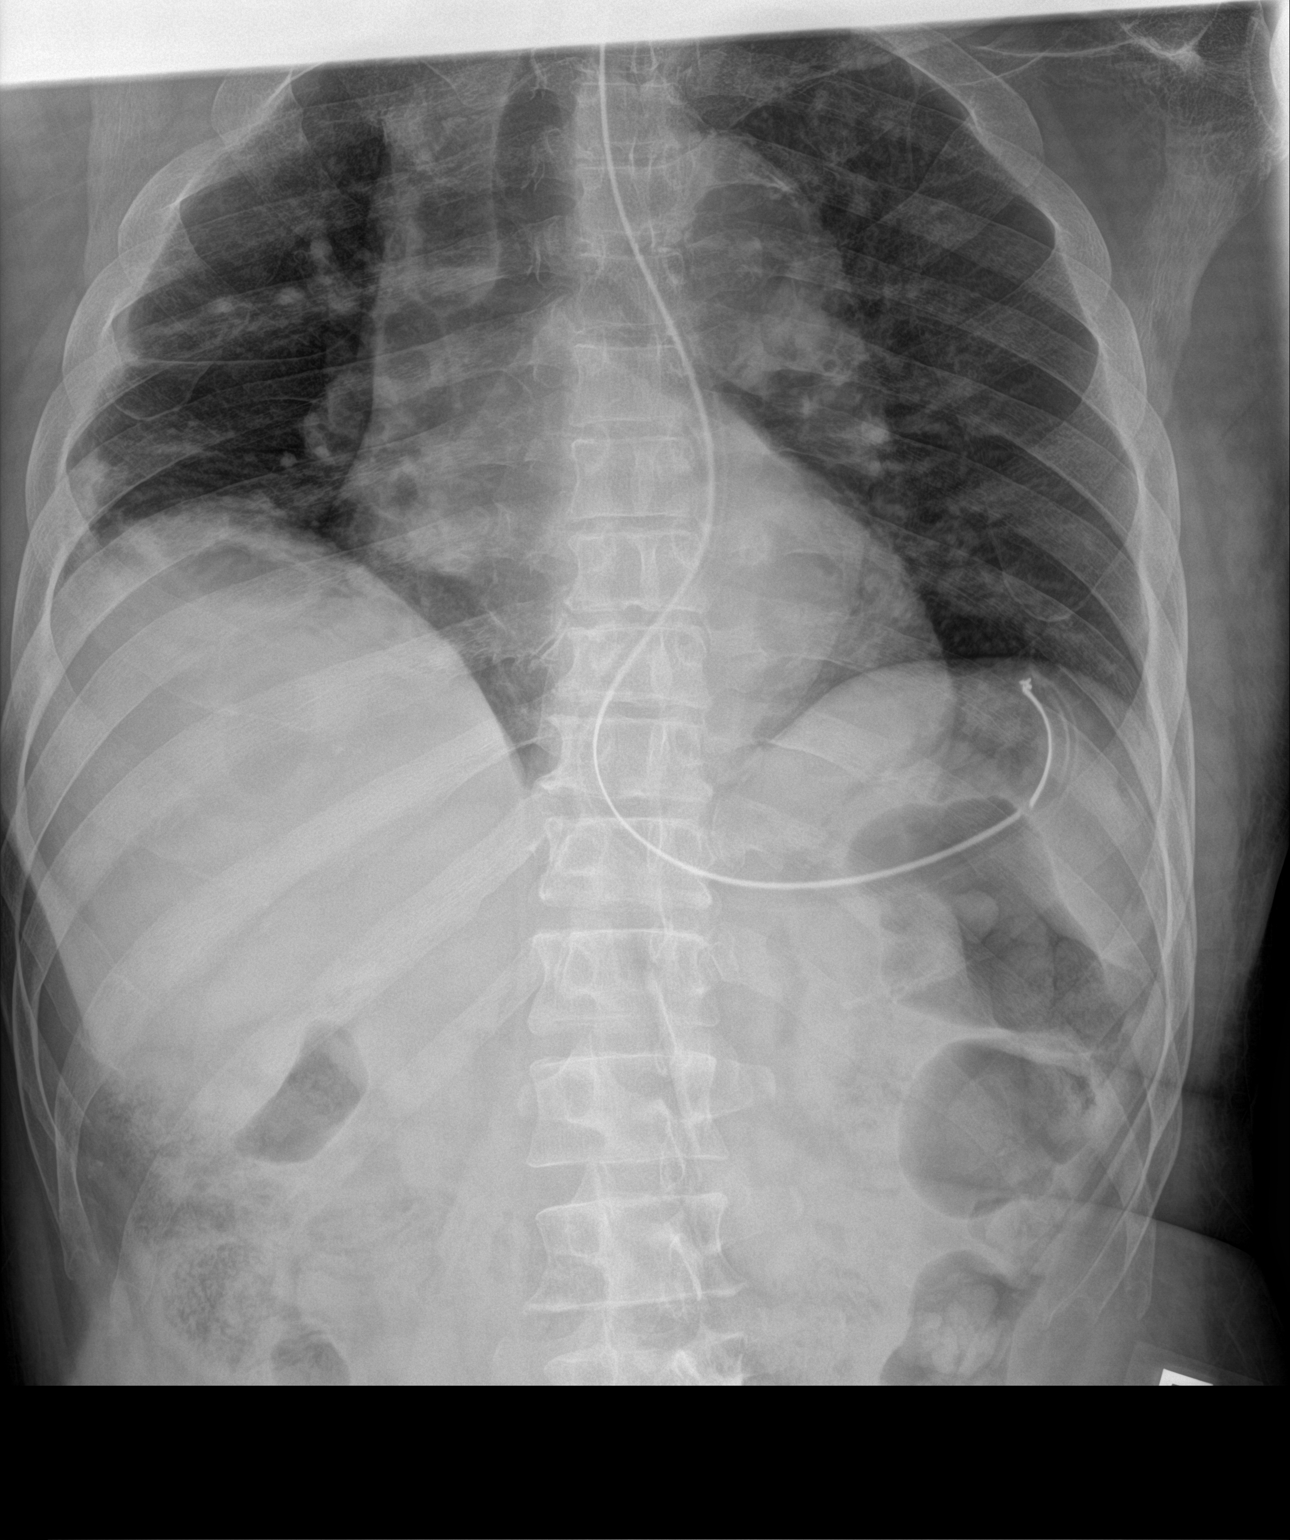

[1 of 1 positions shown; findings below may reference images not displayed]

FINDINGS: The esophagogastric tube tip and proximal port lie in the gastric
cardia. The bowel gas pattern is within the limits of normal today.
There is patchy density at the right lung base laterally which
likely reflects atelectasis and appears new.
IMPRESSION: The esophagogastric tube tip in proximal port lie in the gastric
cardia.

Probable subsegmental atelectasis at the right lung base which is
new.

## 2018-09-17 ENCOUNTER — Other Ambulatory Visit: Payer: Self-pay | Admitting: Cardiovascular Disease

## 2018-10-14 ENCOUNTER — Other Ambulatory Visit: Payer: Self-pay | Admitting: Cardiovascular Disease

## 2018-10-14 NOTE — Telephone Encounter (Signed)
Rx(s) sent to pharmacy electronically.  

## 2018-10-31 ENCOUNTER — Other Ambulatory Visit: Payer: Self-pay | Admitting: Internal Medicine

## 2018-10-31 DIAGNOSIS — E1165 Type 2 diabetes mellitus with hyperglycemia: Secondary | ICD-10-CM

## 2018-10-31 DIAGNOSIS — IMO0002 Reserved for concepts with insufficient information to code with codable children: Secondary | ICD-10-CM

## 2018-10-31 DIAGNOSIS — E118 Type 2 diabetes mellitus with unspecified complications: Principal | ICD-10-CM

## 2018-12-05 ENCOUNTER — Other Ambulatory Visit: Payer: Self-pay | Admitting: Internal Medicine

## 2018-12-05 DIAGNOSIS — E118 Type 2 diabetes mellitus with unspecified complications: Principal | ICD-10-CM

## 2018-12-05 DIAGNOSIS — IMO0002 Reserved for concepts with insufficient information to code with codable children: Secondary | ICD-10-CM

## 2018-12-05 DIAGNOSIS — E1165 Type 2 diabetes mellitus with hyperglycemia: Secondary | ICD-10-CM

## 2018-12-12 ENCOUNTER — Other Ambulatory Visit: Payer: Self-pay | Admitting: Cardiovascular Disease

## 2018-12-13 NOTE — Telephone Encounter (Signed)
Amlodipine 10 mg refilled. 

## 2019-01-17 ENCOUNTER — Telehealth: Payer: Self-pay | Admitting: *Deleted

## 2019-01-17 NOTE — Telephone Encounter (Signed)
A message was left, re: follow up visit. 

## 2019-01-20 ENCOUNTER — Other Ambulatory Visit: Payer: Self-pay | Admitting: Cardiovascular Disease

## 2019-01-20 ENCOUNTER — Telehealth: Payer: Self-pay | Admitting: Cardiovascular Disease

## 2019-01-20 NOTE — Telephone Encounter (Signed)
Message routed to MD to advise if patient needs labs

## 2019-01-20 NOTE — Telephone Encounter (Signed)
New Message   Patient has virtual visit scheduled for 02/22/19 and wants to know if they need to have labs done before appointment.

## 2019-01-21 NOTE — Telephone Encounter (Signed)
He had labs done in November so we should be good.

## 2019-01-25 NOTE — Telephone Encounter (Signed)
Detailed message left on the patient's daughter's voicemail, per the dpr, with Dr. Tyrell Antonio response. She has been advised to call back if anything further is needed.

## 2019-02-22 ENCOUNTER — Telehealth (INDEPENDENT_AMBULATORY_CARE_PROVIDER_SITE_OTHER): Payer: Medicare HMO | Admitting: Cardiovascular Disease

## 2019-02-22 VITALS — BP 135/77 | HR 79 | Ht 68.0 in

## 2019-02-22 DIAGNOSIS — E785 Hyperlipidemia, unspecified: Secondary | ICD-10-CM

## 2019-02-22 DIAGNOSIS — I739 Peripheral vascular disease, unspecified: Secondary | ICD-10-CM

## 2019-02-22 DIAGNOSIS — I2510 Atherosclerotic heart disease of native coronary artery without angina pectoris: Secondary | ICD-10-CM

## 2019-02-22 DIAGNOSIS — I10 Essential (primary) hypertension: Secondary | ICD-10-CM

## 2019-02-22 MED ORDER — ROSUVASTATIN CALCIUM 40 MG PO TABS: 40.0000 mg | ORAL_TABLET | Freq: Every day | ORAL | 1 refills | Status: DC

## 2019-02-22 NOTE — Patient Instructions (Signed)
Medication Instructions:  Increase Crestor to 40 mg daily. If you need a refill on your cardiac medications before your next appointment, please call your pharmacy.   Follow-Up: At [facility], you and your health needs are our priority.  As part of our continuing mission to provide you with exceptional heart care, we have created designated Provider Care Teams.  These Care Teams include your primary Cardiologist (physician) and Advanced Practice Providers (APPs -  Physician Assistants and Nurse Practitioners) who all work together to provide you with the care you need, when you need it. You will need a follow up appointment in 6 months.  Please call our office 2 months in advance to schedule this appointment.  You may see Dr.Arida or one of the following Advanced Practice Providers on your designated Care Team:   Kerin Ransom, PA-C Roby Lofts, [place] . Sande Rives, PA-C

## 2019-02-22 NOTE — Progress Notes (Signed)
Virtual Visit via Telephone Note   This visit type was conducted due to national recommendations for restrictions regarding the COVID-19 Pandemic (e.g. social distancing) in an effort to limit this patient's exposure and mitigate transmission in our community.  Due to his co-morbid illnesses, this patient is at least at moderate risk for complications without adequate follow up.  This format is felt to be most appropriate for this patient at this time.  The patient did not have access to video technology/had technical difficulties with video requiring transitioning to audio format only (telephone).  All issues noted in this document were discussed and addressed.  No physical exam could be performed with this format.  Please refer to the patient's chart for his  consent to telehealth for [facility].   Date:  02/22/2019   ID:  [name], DOB 15-Jul-1946, MRN [id number]  Patient Location: Home Provider Location: Office  PCP:  [name], MD  Cardiologist:  Dr. Oval Linsey Electrophysiologist:  None   Evaluation Performed:  Follow-Up Visit  Chief Complaint: Numbness in both feet  History of Present Illness:    [name] is a 73 y.o. male who was reached via phone for a follow-up visit regarding peripheral arterial disease. He has known history of coronary artery disease status post RCA PCI, PAD with previous PTA, essential hypertension and poorly controlled diabetes.  He had prior GI bleed Status post partial small bowel resection in November 2018 for a carcinoid tumor. He has history of poor compliance in the past which has improved.  The patient had angiography done in 2016 by Dr. [name].  It showed patent renal arteries. There was 99% stenosis in the right mid SFA which was treated with drug-coated balloon angioplasty.  He also had angioplasty of the right peroneal artery.  He then underwent staged PTA and atherectomy of the left anterior tibial artery. The patient reports  that he was asymptomatic at the time of these interventions.  He underwent noninvasive vascular studies in 2019 which showed normal ABI bilaterally.  However,toe pressure was abnormal and also the waveforms were abnormal.  Duplex showed moderate right SFA disease with occluded posterior tibial artery.  On the left, there was also moderate left SFA disease with occluded posterior tibial artery.  He has been doing reasonably well and denies any chest pain, shortness of breath or palpitations.  He reports numbness in both feet but no true claudication.  No lower extremity ulceration.   The patient does not have symptoms concerning for COVID-19 infection (fever, chills, cough, or new shortness of breath).    Past Medical History:  Diagnosis Date  . Angiodysplasia of small intestine 02/2016   terminal ileum  . Bleeding gastric ulcer 2000's  . Chronic anemia   . Coronary artery disease   . Diverticulosis   . GERD (gastroesophageal reflux disease)   . GI bleeding   . History of blood transfusion 2000's   "related to bleeding gastric ulcer"  . Hyperlipidemia   . Hypertension   . Non-compliance   . PVD (peripheral vascular disease) ([place])   . S/P coronary artery stent placement 10/03/2016  . Type II diabetes mellitus ([place])    Past Surgical History:  Procedure Laterality Date  . CARDIAC CATHETERIZATION N/A 04/24/2015   Procedure: Left Heart Cath and Coronary Angiography;  Surgeon: [name], MD;  Location: [place] [facility];  Service: Cardiovascular;  Laterality: N/A;  . CARDIAC CATHETERIZATION N/A 04/24/2015   Procedure: Coronary Stent Intervention;  Surgeon: Ulice Dash  [name], MD;  Location: [place] [facility];  Service: Cardiovascular;  Laterality: N/A;  Prox RCA  . CARDIAC CATHETERIZATION Right 05/29/2015   Procedure: Angioplasty Perioneal and SFA;  Surgeon: [name], MD;  Location: [place] [facility];  Service: Cardiovascular;  Laterality: Right;  . COLONOSCOPY Left 02/29/2016   Procedure:  COLONOSCOPY;  Surgeon: Carol Ada, MD;  [facility];  Service: Endoscopy;  Laterality: Left;  . COLONOSCOPY WITH PROPOFOL N/A 11/23/2015   Procedure: COLONOSCOPY WITH PROPOFOL;  Surgeon: Carol Ada, MD;  Location: [facility];  Service: Endoscopy;  Laterality: N/A;  . CORONARY ANGIOPLASTY    . ESOPHAGOGASTRODUODENOSCOPY (EGD) WITH PROPOFOL N/A 11/23/2015   Procedure: ESOPHAGOGASTRODUODENOSCOPY (EGD) WITH PROPOFOL;  Surgeon: Carol Ada, MD;  [facility];  Service: Endoscopy;  Laterality: N/A;  . EXCISIONAL HEMORRHOIDECTOMY  1980's  . GIVENS CAPSULE STUDY N/A 02/29/2016   Procedure: GIVENS CAPSULE STUDY;  Surgeon: Carol Ada, MD;  [facility]: [facility];  Service: Endoscopy;  Laterality: N/A;  . LAPAROSCOPIC SMALL BOWEL RESECTION N/A 06/30/2017   Procedure: LAPAROSCOPIC SMALL BOWEL RESECTION;  Surgeon: Leighton Ruff, MD;  [facility];  Service: General;  Laterality: N/A;  . PERIPHERAL VASCULAR CATHETERIZATION N/A 04/24/2015   Procedure: Lower Extremity Angiography;  Surgeon: [name], MD;  Location: [place] [facility];  Service: Cardiovascular;  Laterality: N/A;  . PERIPHERAL VASCULAR CATHETERIZATION N/A 05/29/2015   Procedure: Lower Extremity Angiography;  Surgeon: [name], MD;  Location: [place] [facility];  Service: Cardiovascular;  Laterality: N/A;  . PERIPHERAL VASCULAR CATHETERIZATION Left 05/29/2015   Procedure: Renal Angiography;  Surgeon: [name], MD;  Location: [place] [facility] LAB;  Service: Cardiovascular;  Laterality: Left;  . PERIPHERAL VASCULAR CATHETERIZATION Left 06/12/2015   Procedure: Peripheral Vascular Atherectomy;  Surgeon: [name], MD;  Location: [place] [facility];  Service: Cardiovascular;  Laterality: Left;  AT  . PERIPHERAL VASCULAR CATHETERIZATION Left 06/12/2015   Procedure: Peripheral Vascular Intervention;  Surgeon: [name], MD;  Location: [place] [facility];  Service: Cardiovascular;  Laterality: Left;  PTA TP TRUNK  . UPPER GI  ENDOSCOPY  12/16/11   findings:  mild gastritis and duodenitis     Current Meds  Medication Sig  . amLODipine (NORVASC) 10 MG tablet TAKE 1 TABLET BY MOUTH EVERY DAY  . aspirin 81 MG tablet Take 1 tablet (81 mg total) by mouth daily.  . bisoprolol (ZEBETA) 10 MG tablet Take 10 mg by mouth daily.  . chlorthalidone (HYGROTON) 25 MG tablet Take 1 tablet (25 mg total) by mouth daily.  . cloNIDine (CATAPRES) 0.1 MG tablet 1/2 tab PO BID  . dapagliflozin propanediol (FARXIGA) 5 MG TABS tablet Take 5 mg by mouth daily.  . ferrous sulfate 325 (65 FE) MG tablet TAKE 1 TABLET BY MOUTH 2 TIMES DAILY WITH A MEAL.  Marland Kitchen Insulin Pen Needle (PEN NEEDLES) 31G X 6 MM MISC Use as directed  . isosorbide mononitrate (IMDUR) 30 MG 24 hr tablet Take 1 tablet (30 mg total) daily by mouth.  . metFORMIN (GLUCOPHAGE-XR) 500 MG 24 hr tablet Take 2 tablets (1,000 mg total) by mouth 2 (two) times daily. MUST MAKE APPT FOR FURTHER REFILLS  . rosuvastatin (CRESTOR) 20 MG tablet Take 1 tablet (20 mg total) by mouth daily.  . Tdap (BOOSTRIX) 5-2.5-18.5 LF-MCG/0.5 injection Inject 0.5 mLs into the muscle as directed.     Allergies:   Patient has no known allergies.   Social History   Tobacco Use  . Smoking status:  Former Smoker    Packs/day: 1.00    Years: 54.00    Pack years: 54.00    Types: Cigars, Cigarettes    Quit date: 02/16/2015    Years since quitting: 4.0  . Smokeless tobacco: Former Systems developer    Types: Chew  . Tobacco comment: "chewed some when I was young"  Substance Use Topics  . Alcohol use: No    Alcohol/week: 0.0 standard drinks    Comment: 04/24/2015 "no beer in 2 wks; ;no liquor in 3-4 weeks; I'm not a real heavy drinker" states he stopped drining 2 yrs ago  . Drug use: No     Family Hx: The patient's family history includes Alcohol abuse in his father; CVA in his unknown relative; Diabetes in his mother; Heart attack in his father; Hypertension in his father and unknown relative; Peripheral Artery  Disease in his father.  ROS:   Please see the history of present illness.     All other systems reviewed and are negative.   Prior CV studies:   The following studies were reviewed today:  Reviewed most recent vascular studies.  Labs/Other Tests and Data Reviewed:    EKG:  No ECG reviewed.  Recent Labs: 04/05/2018: Hemoglobin 14.6; Platelets 221 07/06/2018: ALT 15; BUN 15; Creatinine, Ser 1.45; Potassium 4.2; Sodium 136   Recent Lipid Panel Lab Results  Component Value Date/Time   CHOL 171 07/06/2018 11:05 AM   TRIG 128 07/06/2018 11:05 AM   HDL 47 07/06/2018 11:05 AM   CHOLHDL 3.6 07/06/2018 11:05 AM   CHOLHDL 3.7 10/31/2015 03:59 PM   LDLCALC 98 07/06/2018 11:05 AM    Wt Readings from Last 3 Encounters:  07/06/18 202 lb (91.6 kg)  04/21/18 195 lb (88.5 kg)  04/05/18 200 lb 12.8 oz (91.1 kg)     Objective:    Vital Signs:  BP 135/77   Pulse 79   Ht 5\' 8"  (1.727 m)   BMI 30.71 kg/m    VITAL SIGNS:  reviewed  ASSESSMENT & PLAN:    1.  Peripheral arterial disease with previous endovascular intervention on bilateral lower extremity as outlined above.  He does not have claudication at the present time but does have numbness in bilateral feet which I suspect is likely due to diabetic peripheral neuropathy.  Consider treatment with gabapentin for symptomatic relief. Continue to monitor pressure arterial disease for now and there is no indication for revascularization.  2.  Coronary artery disease involving native coronary arteries without angina: Continue aggressive medical therapy.  3.  Essential hypertension: He is known to have refractory hypertension.  Previous renal angiography showed no evidence of renal artery stenosis.  Blood pressure is controlled now.  4.  Hyperlipidemia: Continue treatment with rosuvastatin.  Most recent LDL was 98.  Thus, I increased rosuvastatin to 40 mg daily.  We will need to get his LDL below 70.   COVID-19 Education: The signs  and symptoms of COVID-19 were discussed with the patient and how to seek care for testing (follow up with PCP or arrange E-visit).  The importance of social distancing was discussed today.  Time:   Today, I have spent 10 minutes with the patient with telehealth technology discussing the above problems.     Medication Adjustments/Labs and Tests Ordered: Current medicines are reviewed at length with the patient today.  Concerns regarding medicines are outlined above.   Tests Ordered: No orders of the defined types were placed in this encounter.   Medication Changes: No  orders of the defined types were placed in this encounter.   Follow Up:  In Person in 6 month(s)  Signed, Kathlyn Sacramento, MD  02/22/2019 9:01 AM    [place]

## 2019-03-08 ENCOUNTER — Telehealth: Payer: Self-pay | Admitting: Cardiovascular Disease

## 2019-03-08 DIAGNOSIS — I739 Peripheral vascular disease, unspecified: Secondary | ICD-10-CM

## 2019-03-08 NOTE — Telephone Encounter (Signed)
Spoke with pt's daughter who report pt has been c/o bilateral leg pain. She state pt had a virtual visit with Dr. Fletcher Anon on 7/7 but wasn't completely honest with MD and state pt just doesn't want to have another stent placed. Daughter also report pt is not very compliant with taking medication as prescribed and could be the reason his LDL was still elevated. Daughter is requesting that pt be seen in office with a family member present. She state she is trying to prevent things from worsening. Daughter also inquiring if MD recommends pt have repeat dopplers. Will route to MD for recommendations.  Appointment scheduled for 7/28

## 2019-03-08 NOTE — Telephone Encounter (Signed)
New Message   Patient's daughter called in due to patient having returned leg pain from the PAD (peripheral artery disease). She states that the patient told her that he is now taking 40 mg of the Crestor and it makes him feel bad and now the pain in his legs are back. Patient's daughter is requesting a call back to discuss the changes made at the visit on 02/22/19 and what can be done about the symptoms the patient is experiencing. Please give her a call back

## 2019-03-09 ENCOUNTER — Telehealth: Payer: Self-pay | Admitting: Cardiovascular Disease

## 2019-03-09 NOTE — Telephone Encounter (Signed)
LVM for patient to call and schedule tests.

## 2019-03-09 NOTE — Telephone Encounter (Signed)
Schedule an ABI and LEA and office follow up with me after.

## 2019-03-09 NOTE — Telephone Encounter (Signed)
Left a message for the patient's daughter to call back.  

## 2019-03-09 NOTE — Telephone Encounter (Signed)
Daughter updated and voiced understanding. Will send message to schedulers to arrange testing date and inform them to call daughter.

## 2019-03-09 NOTE — Telephone Encounter (Signed)
Follow up ° °Patient's daughter is returning call. °

## 2019-03-10 ENCOUNTER — Other Ambulatory Visit (HOSPITAL_COMMUNITY): Payer: Self-pay | Admitting: Cardiovascular Disease

## 2019-03-10 ENCOUNTER — Ambulatory Visit (HOSPITAL_COMMUNITY)
Admission: RE | Admit: 2019-03-10 | Discharge: 2019-03-10 | Disposition: A | Discharge status: Home / Self Care | Payer: Medicare HMO | Source: Ambulatory Visit | Attending: Cardiology | Admitting: Cardiology

## 2019-03-10 ENCOUNTER — Other Ambulatory Visit: Payer: Self-pay

## 2019-03-10 DIAGNOSIS — Z9862 Peripheral vascular angioplasty status: Secondary | ICD-10-CM

## 2019-03-10 DIAGNOSIS — I739 Peripheral vascular disease, unspecified: Secondary | ICD-10-CM

## 2019-03-14 ENCOUNTER — Telehealth: Payer: Self-pay | Admitting: *Deleted

## 2019-03-14 NOTE — Telephone Encounter (Signed)
Left a message for the patient to call back to confirm his appointment tomorrow with Dr. Fletcher Anon and to go over the prescreening questions.

## 2019-03-15 ENCOUNTER — Other Ambulatory Visit: Payer: Self-pay

## 2019-03-15 ENCOUNTER — Encounter: Payer: Self-pay | Admitting: Cardiovascular Disease

## 2019-03-15 ENCOUNTER — Ambulatory Visit (INDEPENDENT_AMBULATORY_CARE_PROVIDER_SITE_OTHER): Payer: Medicare HMO | Admitting: Cardiovascular Disease

## 2019-03-15 VITALS — BP 180/100 | HR 72 | Temp 97.2°F | Ht 68.0 in | Wt 198.6 lb

## 2019-03-15 DIAGNOSIS — E785 Hyperlipidemia, unspecified: Secondary | ICD-10-CM

## 2019-03-15 DIAGNOSIS — I2510 Atherosclerotic heart disease of native coronary artery without angina pectoris: Secondary | ICD-10-CM

## 2019-03-15 DIAGNOSIS — I739 Peripheral vascular disease, unspecified: Secondary | ICD-10-CM | POA: Diagnosis not present

## 2019-03-15 DIAGNOSIS — I10 Essential (primary) hypertension: Secondary | ICD-10-CM

## 2019-03-15 NOTE — Patient Instructions (Signed)
Medication Instructions:  DECREASE YOUR ROSUVASTATIN TO 20 MG DAILY   If you need a refill on your cardiac medications before your next appointment, please call your pharmacy.   Lab work: NONE   Testing/Procedures: NONE  Follow-Up: At [facility], you and your health needs are our priority.  As part of our continuing mission to provide you with exceptional heart care, we have created designated Provider Care Teams.  These Care Teams include your primary Cardiologist (physician) and Advanced Practice Providers (APPs -  Physician Assistants and Nurse Practitioners) who all work together to provide you with the care you need, when you need it. You will need a follow up appointment in 12 months.  Please call our office 2 months in advance to schedule this appointment.  You may see DR Fletcher Anon or one of the following Advanced Practice Providers on your designated Care Team:   Kerin Ransom, PA-C Roby Lofts, [place] . Sande Rives, PA-C

## 2019-03-15 NOTE — Progress Notes (Signed)
Cardiology Office Note   Date:  03/15/2019   ID:  Hulon, Ferron 05-Jun-1946, MRN [id number]  PCP:  [name], MD  Cardiologist: Dr. Oval Linsey  No chief complaint on file.     History of Present Illness: [name] is a 73 y.o. male who is here for a follow-up visit regarding peripheral arterial disease. He has known history of coronary artery disease status post RCA PCI, PAD with previous PTA, essential hypertension and poorly controlled diabetes.  He had prior GI bleed Status post partial small bowel resection in November 2018 for a carcinoid tumor. He has history of poor compliance in the past which has improved.  The patient had angiography done in 2016 by Dr. [name].  It showed patent renal arteries. There was 99% stenosis in the right mid SFA which was treated with drug-coated balloon angioplasty.  He also had angioplasty of the right peroneal artery.  He then underwent staged PTA and atherectomy of the left anterior tibial artery. He has been doing reasonably well and denies any chest pain, shortness of breath or palpitations.  He reports numbness in both feet but no true claudication.  No lower extremity ulceration. The patient's daughter called [place] recently with concerns about compliance with medications in addition to occasional bilateral leg pain.We repeated his vascular studies which showed mildly reduced ABI on the left and normal on the right. Duplex showed moderate diffuse  Disease with none below the knee disease.  The patient is on multiple antihypertensive medications but he is not very organized.  He forgets to take some of them and on occasions he does not take any of them according to the daughter.  He reports dizziness when he takes high-dose rosuvastatin and wants to go back on 20 mg daily.       Past Medical History:  Diagnosis Date  . Angiodysplasia of small intestine 02/2016   terminal ileum  . Bleeding gastric ulcer 2000's  . Chronic  anemia   . Coronary artery disease   . Diverticulosis   . GERD (gastroesophageal reflux disease)   . GI bleeding   . History of blood transfusion 2000's   "related to bleeding gastric ulcer"  . Hyperlipidemia   . Hypertension   . Non-compliance   . PVD (peripheral vascular disease) ([place])   . S/P coronary artery stent placement 10/03/2016  . Type II diabetes mellitus ([place])     Past Surgical History:  Procedure Laterality Date  . CARDIAC CATHETERIZATION N/A 04/24/2015   Procedure: Left Heart Cath and Coronary Angiography;  Surgeon: [name], MD;  Location: [place] [facility];  Service: Cardiovascular;  Laterality: N/A;  . CARDIAC CATHETERIZATION N/A 04/24/2015   Procedure: Coronary Stent Intervention;  Surgeon: [name], MD;  Location: [place] [facility];  Service: Cardiovascular;  Laterality: N/A;  Prox RCA  . CARDIAC CATHETERIZATION Right 05/29/2015   Procedure: Angioplasty Perioneal and SFA;  Surgeon: [name], MD;  Location: [place] [facility];  Service: Cardiovascular;  Laterality: Right;  . COLONOSCOPY Left 02/29/2016   Procedure: COLONOSCOPY;  Surgeon: Carol Ada, MD;  [facility];  Service: Endoscopy;  Laterality: Left;  . COLONOSCOPY WITH PROPOFOL N/A 11/23/2015   Procedure: COLONOSCOPY WITH PROPOFOL;  Surgeon: Carol Ada, MD;  [facility];  Service: Endoscopy;  Laterality: N/A;  . CORONARY ANGIOPLASTY    . ESOPHAGOGASTRODUODENOSCOPY (EGD) WITH PROPOFOL N/A 11/23/2015   Procedure: ESOPHAGOGASTRODUODENOSCOPY (EGD) WITH PROPOFOL;  Surgeon: Carol Ada, MD;  [facility];  Service: Endoscopy;  Laterality: N/A;  . EXCISIONAL HEMORRHOIDECTOMY  1980's  . GIVENS CAPSULE STUDY N/A 02/29/2016   Procedure: GIVENS CAPSULE STUDY;  Surgeon: Carol Ada, MD;  [facility];  Service: Endoscopy;  Laterality: N/A;  . LAPAROSCOPIC SMALL BOWEL RESECTION N/A 06/30/2017   Procedure: LAPAROSCOPIC SMALL BOWEL RESECTION;  Surgeon: Leighton Ruff, MD;   [facility];  Service: General;  Laterality: N/A;  . PERIPHERAL VASCULAR CATHETERIZATION N/A 04/24/2015   Procedure: Lower Extremity Angiography;  Surgeon: [name], MD;  Location: [place] [facility];  Service: Cardiovascular;  Laterality: N/A;  . PERIPHERAL VASCULAR CATHETERIZATION N/A 05/29/2015   Procedure: Lower Extremity Angiography;  Surgeon: [name], MD;  Location: [place] [facility];  Service: Cardiovascular;  Laterality: N/A;  . PERIPHERAL VASCULAR CATHETERIZATION Left 05/29/2015   Procedure: Renal Angiography;  Surgeon: [name], MD;  Location: [place] [facility];  Service: Cardiovascular;  Laterality: Left;  . PERIPHERAL VASCULAR CATHETERIZATION Left 06/12/2015   Procedure: Peripheral Vascular Atherectomy;  Surgeon: [name], MD;  Location: [place] [facility];  Service: Cardiovascular;  Laterality: Left;  AT  . PERIPHERAL VASCULAR CATHETERIZATION Left 06/12/2015   Procedure: Peripheral Vascular Intervention;  Surgeon: [name], MD;  Location: [place] [facility];  Service: Cardiovascular;  Laterality: Left;  PTA TP TRUNK  . UPPER GI ENDOSCOPY  12/16/11   findings:  mild gastritis and duodenitis     Current Outpatient Medications  Medication Sig Dispense Refill  . amLODipine (NORVASC) 10 MG tablet TAKE 1 TABLET BY MOUTH EVERY DAY 90 tablet 0  . aspirin 81 MG tablet Take 1 tablet (81 mg total) by mouth daily. 30 tablet 11  . bisoprolol (ZEBETA) 10 MG tablet Take 10 mg by mouth daily.    . chlorthalidone (HYGROTON) 25 MG tablet Take 1 tablet (25 mg total) by mouth daily. 90 tablet 2  . cloNIDine (CATAPRES) 0.1 MG tablet 1/2 tab PO BID 30 tablet 3  . dapagliflozin propanediol (FARXIGA) 5 MG TABS tablet Take 5 mg by mouth daily. 30 tablet 3  . ferrous sulfate 325 (65 FE) MG tablet TAKE 1 TABLET BY MOUTH 2 TIMES DAILY WITH A MEAL. 60 tablet 5  . Insulin Pen Needle (PEN NEEDLES) 31G X 6 MM MISC Use as directed 100 each 6  . isosorbide mononitrate (IMDUR) 30 MG 24 hr tablet Take  1 tablet (30 mg total) daily by mouth. 30 tablet 3  . metFORMIN (GLUCOPHAGE-XR) 500 MG 24 hr tablet Take 2 tablets (1,000 mg total) by mouth 2 (two) times daily. MUST MAKE APPT FOR FURTHER REFILLS 120 tablet 1  . rosuvastatin (CRESTOR) 40 MG tablet Take 1 tablet (40 mg total) by mouth daily. 90 tablet 1  . Tdap (BOOSTRIX) 5-2.5-18.5 LF-MCG/0.5 injection Inject 0.5 mLs into the muscle as directed. 0.5 mL 0   No current facility-administered medications for this visit.     Allergies:   Patient has no known allergies.    Social History:  The patient  reports that he quit smoking about 4 years ago. His smoking use included cigars and cigarettes. He has a 54.00 pack-year smoking history. He has quit using smokeless tobacco.  His smokeless tobacco use included chew. He reports that he does not drink alcohol or use drugs.   Family History:  The patient's family history includes Alcohol abuse in his father; CVA in his unknown relative; Diabetes in his mother; Heart attack in his father; Hypertension in his father and unknown relative; Peripheral Artery Disease  in his father.    ROS:  Please see the history of present illness.   Otherwise, review of systems are positive for none.   All other systems are reviewed and negative.    PHYSICAL EXAM: VS:  BP (!) 180/100   Pulse 72   Temp (!) 97.2 F (36.2 C)   Ht 5\' 8"  (1.727 m)   Wt 198 lb 9.6 oz (90.1 kg)   SpO2 97%   BMI 30.20 kg/m  , BMI Body mass index is 30.2 kg/m. GEN: Well nourished, well developed, in no acute distress  HEENT: normal  Neck: no JVD, carotid bruits, or masses Cardiac: RRR; no murmurs, rubs, or gallops,no edema  Respiratory:  clear to auscultation bilaterally, normal work of breathing GI: soft, nontender, nondistended, + BS MS: no deformity or atrophy  Skin: warm and dry, no rash Neuro:  Strength and sensation are intact Psych: euthymic mood, full affect   EKG:  EKG is ordered today. EKG showed normal sinus rhythm  with no significant ST or T wave changes.  Recent Labs: 04/05/2018: Hemoglobin 14.6; Platelets 221 07/06/2018: ALT 15; BUN 15; Creatinine, Ser 1.45; Potassium 4.2; Sodium 136    Lipid Panel    Component Value Date/Time   CHOL 171 07/06/2018 1105   TRIG 128 07/06/2018 1105   HDL 47 07/06/2018 1105   CHOLHDL 3.6 07/06/2018 1105   CHOLHDL 3.7 10/31/2015 1559   VLDL 27 10/31/2015 1559   LDLCALC 98 07/06/2018 1105      Wt Readings from Last 3 Encounters:  03/15/19 198 lb 9.6 oz (90.1 kg)  07/06/18 202 lb (91.6 kg)  04/21/18 195 lb (88.5 kg)      No flowsheet data found.    ASSESSMENT AND PLAN:  1.  Peripheral arterial disease with previous endovascular intervention on bilateral lower extremity.  Currently with no convincing symptoms of claudication.  He does have some numbness and tingling suggestive of peripheral neuropathy.  Recent noninvasive vascular studies showed mild progression on the left side but still not critical enough to require intervention.  Continue medical therapy.  2.  Coronary artery disease involving native coronary arteries without angina: Continue aggressive medical therapy.  3.  Essential hypertension: He is known to have refractory hypertension.  Previous renal angiography showed no evidence of renal artery stenosis.  His blood pressure is elevated today and there seems to be compliance issues with medications.  I discussed with him the importance of organizing his medications and suggested using a pillbox.  His daughter is going to help him with this.  4.  Hyperlipidemia: The dose of rosuvastatin was increased to 40 mg during most recent visit but he reports dizziness at this dose.  This is an unusual side effect but I did decrease the dose back to 20 mg once daily in order to improve compliance.    Disposition:   FU with me in 1 year  Signed,  Kathlyn Sacramento, MD  03/15/2019 11:04 AM    [place]

## 2019-06-28 ENCOUNTER — Telehealth (INDEPENDENT_AMBULATORY_CARE_PROVIDER_SITE_OTHER): Payer: Medicare HMO | Admitting: Cardiovascular Disease

## 2019-06-28 ENCOUNTER — Encounter: Payer: Self-pay | Admitting: Cardiovascular Disease

## 2019-06-28 VITALS — Ht 68.0 in

## 2019-06-28 DIAGNOSIS — E7800 Pure hypercholesterolemia, unspecified: Secondary | ICD-10-CM | POA: Diagnosis not present

## 2019-06-28 DIAGNOSIS — I10 Essential (primary) hypertension: Secondary | ICD-10-CM | POA: Diagnosis not present

## 2019-06-28 DIAGNOSIS — I739 Peripheral vascular disease, unspecified: Secondary | ICD-10-CM | POA: Diagnosis not present

## 2019-06-28 DIAGNOSIS — Z9119 Patient's noncompliance with other medical treatment and regimen: Secondary | ICD-10-CM | POA: Diagnosis not present

## 2019-06-28 DIAGNOSIS — I2510 Atherosclerotic heart disease of native coronary artery without angina pectoris: Secondary | ICD-10-CM

## 2019-06-28 DIAGNOSIS — Z91199 Patient's noncompliance with other medical treatment and regimen due to unspecified reason: Secondary | ICD-10-CM

## 2019-06-28 NOTE — Progress Notes (Addendum)
Cardiology Office Note (virtual visit-telephone)   This visit type was conducted due to national recommendations for restrictions regarding the COVID-19 Pandemic (e.g. social distancing) in an effort to limit this patient's exposure and mitigate transmission in our community.  Due to her co-morbid illnesses, this patient is at least at moderate risk for complications without adequate follow up.  This format is felt to be most appropriate for this patient at this time.  All issues noted in this document were discussed and addressed.  A limited physical exam was performed with this format.  Please refer to the patient's chart for her consent to telehealth for [facility].   Patient location: home Provider location: office   Date:  06/28/2019   ID:  [name], DOB 1946-05-21, MRN [id number]  PCP:  [name], MD  Cardiologist:   Skeet Latch, MD  PAD: Dr. Kathlyn Sacramento  No chief complaint on file.    History of Present Illness: [name] is a 73 y.o. male with CAD s/p LAD PCI, PAD s/p peripheral stenting, hypertension, poorly-controlled diabetes and non-compliance who presents for follow up.  He was first seen 09/2016. He was hospitalized 02/2016 with melena.   His hemoglobin was 7.1 at the time.  He declined work up and restarted Plavix on his own.  He underwent upper and lower endoscopy and was found to have diverticulosis but no source of bleeding.  No bleeding was found on capsule endoscopy at [facility].  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  Per a letter from his daughter and talking with his wife he is very noncompliant with all medications.  Mr. Bester was referred for [facility] 10/24/16 that revealed LVEF 49% with no ischemia and global hypokinesis. He was referred for an echocardiogram which has not yet occurred.  He also had ABIs that revealed greater than 50% right distal SFA stenosis status post angioplasty and  30-49% left mid to distal superficial SFA stenosis.  Repeat Dopplers 02/03/2018 revealed mild progression with 50 to 74% stenosis in the right SFA and 30 to 49% stenosis in the proximal peroneal artery.  On the left he had 50 to 74% of the SFA.  He saw Dr. Fletcher Anon 02/2018.  He was felt to be stable and had no symptoms of claudication.  Therefore medical management was recommended.  Repeat Doppler 02/2019 revealed mild progression on the left with atherosclerosis in the common femoral, femoral, popliteal, and tibial arteries.  There was 50 to 74% stenosis of the proximal and distal left SFA.  There is no change on the right which continued to show 50 to 75% stenosis in the distal SFA.  The posterior tibial artery was occluded in both legs but collateral flow was noted.  Mr. Sheridan saw Dr. Wynetta Emery on 04/05/2018.  His hemoglobin A1c was over 9%.  Levemir insulin was added.  However he never picked it up from the pharmacy because it was going to cost him over $200.  He never contacted Dr. Durenda Age office to let her know.  Since his last appointment Mr. Callis saw Dr. Fletcher Anon on 02/2019.  At that time he had numbness in his feet but no claudication or ulceration.  The symptoms were thought to be consistent with peripheral neuropathy.  There was concern about compliance with his medication regimen.  His blood pressure was elevated at that time but it was thought to be due to noncompliance.  He had dizziness that he attributed to a higher  dose of rosuvastatin so this was reduced back to 20 mg daily.  Mr. Swindell reports that he has been feeling well.  He has no chest pain or shortness of breath.  He has no lower extremity edema, orthopnea, or PND.  He walks his dog daily and is able to play 18 holes of golf he has been reading a book about diabetes and has been learning more about what he can eat.  He reports checking his blood pressure at times and states that it is "in a good range."  However he is unable to provide any  specific numbers.  He also thinks his blood glucose is better controlled and attributes this to the changes that he is made in his diet, such as eating apples and peanut butter for snacks.  Overall he is feeling well and is without complaint.   Past Medical History:  Diagnosis Date   Angiodysplasia of small intestine 02/2016   terminal ileum   Bleeding gastric ulcer 2000's   Chronic anemia    Coronary artery disease    Diverticulosis    GERD (gastroesophageal reflux disease)    GI bleeding    History of blood transfusion 2000's   "related to bleeding gastric ulcer"   Hyperlipidemia    Hypertension    Non-compliance    PVD (peripheral vascular disease) (HCC)    S/P coronary artery stent placement 10/03/2016   Type II diabetes mellitus ([place])     Past Surgical History:  Procedure Laterality Date   CARDIAC CATHETERIZATION N/A 04/24/2015   Procedure: Left Heart Cath and Coronary Angiography;  Surgeon: [name], MD;  Location: [place] [facility];  Service: Cardiovascular;  Laterality: N/A;   CARDIAC CATHETERIZATION N/A 04/24/2015   Procedure: Coronary Stent Intervention;  Surgeon: [name], MD;  Location: [place] [facility];  Service: Cardiovascular;  Laterality: N/A;  Prox RCA   CARDIAC CATHETERIZATION Right 05/29/2015   Procedure: Angioplasty Perioneal and SFA;  Surgeon: [name], MD;  Location: [place] [facility];  Service: Cardiovascular;  Laterality: Right;   COLONOSCOPY Left 02/29/2016   Procedure: COLONOSCOPY;  Surgeon: Carol Ada, MD;  Location: [facility];  Service: Endoscopy;  Laterality: Left;   COLONOSCOPY WITH PROPOFOL N/A 11/23/2015   Procedure: COLONOSCOPY WITH PROPOFOL;  Surgeon: Carol Ada, MD;  Location: [facility];  Service: Endoscopy;  Laterality: N/A;   CORONARY ANGIOPLASTY     ESOPHAGOGASTRODUODENOSCOPY (EGD) WITH PROPOFOL N/A 11/23/2015   Procedure: ESOPHAGOGASTRODUODENOSCOPY (EGD) WITH PROPOFOL;  Surgeon: Carol Ada, MD;  [facility]: [facility];  Service: Endoscopy;  Laterality: N/A;   EXCISIONAL HEMORRHOIDECTOMY  1980's   GIVENS CAPSULE STUDY N/A 02/29/2016   Procedure: GIVENS CAPSULE STUDY;  Surgeon: Carol Ada, MD;  Location: [facility];  Service: Endoscopy;  Laterality: N/A;   LAPAROSCOPIC SMALL BOWEL RESECTION N/A 06/30/2017   Procedure: LAPAROSCOPIC SMALL BOWEL RESECTION;  Surgeon: Leighton Ruff, MD;  Location: [facility];  Service: General;  Laterality: N/A;   PERIPHERAL VASCULAR CATHETERIZATION N/A 04/24/2015   Procedure: Lower Extremity Angiography;  Surgeon: [name], MD;  Location: [place] [facility];  Service: Cardiovascular;  Laterality: N/A;   PERIPHERAL VASCULAR CATHETERIZATION N/A 05/29/2015   Procedure: Lower Extremity Angiography;  Surgeon: [name], MD;  Location: [place] [facility];  Service: Cardiovascular;  Laterality: N/A;   PERIPHERAL VASCULAR CATHETERIZATION Left 05/29/2015   Procedure: Renal Angiography;  Surgeon: [name], MD;  Location: [place] [facility];  Service: Cardiovascular;  Laterality: Left;   PERIPHERAL VASCULAR CATHETERIZATION Left  06/12/2015   Procedure: Peripheral Vascular Atherectomy;  Surgeon: [name], MD;  Location: [place] [facility];  Service: Cardiovascular;  Laterality: Left;  AT   PERIPHERAL VASCULAR CATHETERIZATION Left 06/12/2015   Procedure: Peripheral Vascular Intervention;  Surgeon: [name], MD;  Location: [place] [facility];  Service: Cardiovascular;  Laterality: Left;  PTA TP TRUNK   UPPER GI ENDOSCOPY  12/16/11   findings:  mild gastritis and duodenitis     Current Outpatient Medications  Medication Sig Dispense Refill   amLODipine (NORVASC) 10 MG tablet TAKE 1 TABLET BY MOUTH EVERY DAY 90 tablet 0   aspirin 81 MG tablet Take 1 tablet (81 mg total) by mouth daily. 30 tablet 11   bisoprolol (ZEBETA) 10 MG tablet Take 10 mg by mouth daily.     chlorthalidone (HYGROTON) 25 MG tablet Take 1 tablet (25 mg total) by mouth daily. 90 tablet 2    cloNIDine (CATAPRES) 0.1 MG tablet 1/2 tab PO BID 30 tablet 3   dapagliflozin propanediol (FARXIGA) 5 MG TABS tablet Take 5 mg by mouth daily. 30 tablet 3   ferrous sulfate 325 (65 FE) MG tablet TAKE 1 TABLET BY MOUTH 2 TIMES DAILY WITH A MEAL. 60 tablet 5   Insulin Pen Needle (PEN NEEDLES) 31G X 6 MM MISC Use as directed 100 each 6   isosorbide mononitrate (IMDUR) 30 MG 24 hr tablet Take 1 tablet (30 mg total) daily by mouth. 30 tablet 3   metFORMIN (GLUCOPHAGE-XR) 500 MG 24 hr tablet Take 2 tablets (1,000 mg total) by mouth 2 (two) times daily. MUST MAKE APPT FOR FURTHER REFILLS 120 tablet 1   rosuvastatin (CRESTOR) 40 MG tablet Take 40 mg by mouth as directed. TAKE 1/2 TABLET DAILY     Tdap (BOOSTRIX) 5-2.5-18.5 LF-MCG/0.5 injection Inject 0.5 mLs into the muscle as directed. 0.5 mL 0   No current facility-administered medications for this visit.     Allergies:   Patient has no known allergies.    Social History:  The patient  reports that he quit smoking about 4 years ago. His smoking use included cigars and cigarettes. He has a 54.00 pack-year smoking history. He has quit using smokeless tobacco.  His smokeless tobacco use included chew. He reports that he does not drink alcohol or use drugs.   Family History:  The patient's family history includes Alcohol abuse in his father; CVA in his unknown relative; Diabetes in his mother; Heart attack in his father; Hypertension in his father and unknown relative; Peripheral Artery Disease in his father.    ROS:  Please see the history of present illness.   Otherwise, review of systems are positive for none.   All other systems are reviewed and negative.    PHYSICAL EXAM: VS:  Ht 5\' 8"  (1.727 m)    BMI 30.20 kg/m  , BMI Body mass index is 30.2 kg/m. GENERAL: Sounds well. LUNGS:  Respirations unlabored. NEURO:  Speech fluent PSYCH:  Cognitively intact, oriented to person place and time   EKG:  EKG is not ordered today. 09/18/17:  Sinus rhythm.  Rate 67 bpm.  Cannot rule out prior septal infarct. 04/21/18: Sinus rhythm.  Rate 75 bpm.  Prior anteroseptal and inferior infarcts.  LAD.  Echo 6/27/8: Study Conclusions  - Left ventricle: The cavity size was normal. Wall thickness was   increased in a pattern of moderate LVH. Systolic function was   normal. The estimated ejection fraction was in the range of 60%  to 65%. Wall motion was normal; there were no regional wall   motion abnormalities. Doppler parameters are consistent with   abnormal left ventricular relaxation (grade 1 diastolic   dysfunction). - Left atrium: The atrium was mildly dilated. - Atrial septum: Mobile atrial septum no obvoius PFO consider f/u   bubble study if clinically indicated.  Lexiscan Myoview 10/24/16: The left ventricular ejection fraction is mildly decreased (45-54%).  Nuclear stress EF: 49%.  There was no ST segment deviation noted during stress.  The study is normal.  This is a low risk study.   ABI 10/21/16:  >50% right distal superficial femoral artery stenosis, s/p angioplasty. 30-49% left mid to distal superficial femoral artery stenosis. Three vessel run-off, bilaterally.   Recent Labs: 07/06/2018: ALT 15; BUN 15; Creatinine, Ser 1.45; Potassium 4.2; Sodium 136    Lipid Panel    Component Value Date/Time   CHOL 171 07/06/2018 1105   TRIG 128 07/06/2018 1105   HDL 47 07/06/2018 1105   CHOLHDL 3.6 07/06/2018 1105   CHOLHDL 3.7 10/31/2015 1559   VLDL 27 10/31/2015 1559   LDLCALC 98 07/06/2018 1105      Wt Readings from Last 3 Encounters:  03/15/19 198 lb 9.6 oz (90.1 kg)  07/06/18 202 lb (91.6 kg)  04/21/18 195 lb (88.5 kg)     ASSESSMENT AND PLAN:  # Hypertension:  Mr. Sheets reports that his blood pressure is well-controlled.  It is very difficult to determine what is truly going on.  He has a history of noncompliance and cannot give me any hard numbers today.  For now we will continue amlodipine,  bisoprolol, chlorthalidone, clonidine, and Imdur.  I asked him to bring a log to follow up  # CAD s/p LAD PCI:  # Hyperlipidemia: No chest pain. Stress test 10/2016 was negative for ischemia.  Continue aspirin, bisoprolol, Imdur, and rosuvastatin 20 mg daily.  He will return for fasting lipids and a CMP.  # Leg numbness: # PAD: Moderate disease bilaterally.  Continue rosuvastatin and aspirin.  Stop clopidogrel.  Blood pressure, lipid, and diabetes control will be extremely key in preventing this from progressing.  Continue follow up with Dr. Fletcher Anon.    Time spent: 18 minutes-Greater than 50% of this time was spent in counseling, explanation of diagnosis, planning of further management, and coordination of care.   Current medicines are reviewed at length with the patient today.  The patient does not have concerns regarding medicines.  The following changes have been made: None  Labs/ tests ordered today include:   No orders of the defined types were placed in this encounter.    Disposition:   FU with Adaisha Campise C. Oval Linsey, MD, [facility] in 3 months.      Signed, Lanett Lasorsa C. Oval Linsey, MD, [facility]  06/28/2019 3:02 PM    [facility]

## 2019-06-28 NOTE — Patient Instructions (Addendum)
Medication Instructions:  Your physician recommends that you continue on your current medications as directed. Please refer to the Current Medication list given to you today.  *If you need a refill on your cardiac medications before your next appointment, please call your pharmacy*  Lab Work: FASTING LP/CMET SOON   If you have labs (blood work) drawn today and your tests are completely normal, you will receive your results only by: Marland Kitchen MyChart Message (if you have MyChart) OR . A paper copy in the mail If you have any lab test that is abnormal or we need to change your treatment, we will call you to review the results.  Testing/Procedures: NONE  Follow-Up: At [facility], you and your health needs are our priority.  As part of our continuing mission to provide you with exceptional heart care, we have created designated Provider Care Teams.  These Care Teams include your primary Cardiologist (physician) and Advanced Practice Providers (APPs -  Physician Assistants and Nurse Practitioners) who all work together to provide you with the care you need, when you need it.  Your next appointment:   3 months 09/28/2019 AT 9:00 WITH DR [facility]   The format for your next appointment:   In Person

## 2019-06-28 NOTE — Addendum Note (Signed)
Addended by: Alvina Filbert B on: 06/28/2019 03:30 PM   Modules accepted: Orders

## 2019-07-06 DIAGNOSIS — I10 Essential (primary) hypertension: Secondary | ICD-10-CM | POA: Diagnosis not present

## 2019-07-06 DIAGNOSIS — E7800 Pure hypercholesterolemia, unspecified: Secondary | ICD-10-CM | POA: Diagnosis not present

## 2019-07-06 LAB — COMPREHENSIVE METABOLIC PANEL
ALT: 15 IU/L (ref 0–44)
AST: 13 IU/L (ref 0–40)
Albumin/Globulin Ratio: 1.5 (ref 1.2–2.2)
Albumin: 4.5 g/dL (ref 3.7–4.7)
Alkaline Phosphatase: 55 IU/L (ref 39–117)
BUN/Creatinine Ratio: 8 — ABNORMAL LOW (ref 10–24)
BUN: 10 mg/dL (ref 8–27)
Bilirubin Total: 0.6 mg/dL (ref 0.0–1.2)
CO2: 24 mmol/L (ref 20–29)
Calcium: 9.6 mg/dL (ref 8.6–10.2)
Chloride: 98 mmol/L (ref 96–106)
Creatinine, Ser: 1.28 mg/dL — ABNORMAL HIGH (ref 0.76–1.27)
GFR calc Af Amer: 63 mL/min/{1.73_m2} (ref >59)
GFR calc non Af Amer: 55 mL/min/{1.73_m2} — ABNORMAL LOW (ref >59)
Globulin, Total: 3.1 g/dL (ref 1.5–4.5)
Glucose: 289 mg/dL — ABNORMAL HIGH (ref 65–99)
Potassium: 4.6 mmol/L (ref 3.5–5.2)
Sodium: 136 mmol/L (ref 134–144)
Total Protein: 7.6 g/dL (ref 6.0–8.5)

## 2019-07-06 LAB — LIPID PANEL
Chol/HDL Ratio: 3.2 ratio (ref 0.0–5.0)
Cholesterol, Total: 160 mg/dL (ref 100–199)
HDL: 50 mg/dL (ref >39)
LDL Chol Calc (NIH): 88 mg/dL (ref 0–99)
Triglycerides: 124 mg/dL (ref 0–149)
VLDL Cholesterol Cal: 22 mg/dL (ref 5–40)

## 2019-07-12 ENCOUNTER — Telehealth: Payer: Self-pay | Admitting: Internal Medicine

## 2019-07-12 NOTE — Telephone Encounter (Signed)
I received lab results from his cardiologist.  Blood sugar level was elevated.  I had my CMA reach out to patient to schedule an appointment as he has not been seen in a year but he declined appointment.

## 2019-07-12 NOTE — Telephone Encounter (Signed)
-----   Message from Jackelyn Knife, [place] sent at 07/12/2019 10:32 AM EST ----- Pt declined appt ----- Message ----- From: [name], MD Sent: 07/11/2019  10:29 PM EST To: Jackelyn Knife, RMA  Pt over due for f/u appt.  Last seen 1 yr ago.  Please call him and offer appt.

## 2019-09-22 ENCOUNTER — Telehealth: Payer: Self-pay | Admitting: *Deleted

## 2019-09-22 DIAGNOSIS — Z5181 Encounter for therapeutic drug level monitoring: Secondary | ICD-10-CM

## 2019-09-22 DIAGNOSIS — I10 Essential (primary) hypertension: Secondary | ICD-10-CM

## 2019-09-22 DIAGNOSIS — E7800 Pure hypercholesterolemia, unspecified: Secondary | ICD-10-CM

## 2019-09-22 MED ORDER — ROSUVASTATIN CALCIUM 40 MG PO TABS: 40.0000 mg | ORAL_TABLET | Freq: Every day | ORAL | 3 refills | Status: DC

## 2019-09-22 NOTE — Telephone Encounter (Signed)
Never heard back from patient regarding labs, was able to reach  Advised patient of lab results, updated Rx sent to pharmacy and mailed lab orders

## 2019-09-22 NOTE — Telephone Encounter (Signed)
-----   Message from Skeet Latch, MD sent at 07/08/2019  3:38 PM EST ----- Cholesterol levels are still a little higher than we would like.  LDL cholesterol needs to be <70.  Take rosuvastatin 40mg  instead of 20mg .  Repeat lipids/CMP in 3 months. Kidney function is better.  Glucose is running high.  Follow up with PCP about this.

## 2019-09-28 ENCOUNTER — Ambulatory Visit: Payer: Medicare HMO | Admitting: Cardiovascular Disease

## 2019-09-28 NOTE — Progress Notes (Deleted)
Cardiology Office Note     Date:  09/28/2019   ID:  Jared, Green 01/21/1946, MRN [id number]  PCP:  [name], MD  Cardiologist:   Skeet Latch, MD   No chief complaint on file.    History of Present Illness: [name] is a 74 y.o. male with CAD s/p LAD PCI, PAD s/p peripheral stenting, hypertension, poorly-controlled diabetes and non-compliance who presents for follow up.  He was first seen 09/2016. He was hospitalized 02/2016 with melena.   His hemoglobin was 7.1 at the time.  He declined work up and restarted Plavix on his own.  He underwent upper and lower endoscopy and was found to have diverticulosis but no source of bleeding.  No bleeding was found on capsule endoscopy at [facility].  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  Per a letter from his daughter and talking with his wife he is very noncompliant with all medications.  [name] was referred for [facility] 10/24/16 that revealed LVEF 49% with no ischemia and global hypokinesis. He was referred for an echocardiogram which has not yet occurred.  He also had ABIs that revealed greater than 50% right distal SFA stenosis status post angioplasty and 30-49% left mid to distal superficial SFA stenosis.  Repeat Dopplers 02/03/2018 revealed mild progression with 50 to 74% stenosis in the right SFA and 30 to 49% stenosis in the proximal peroneal artery.  On the left he had 50 to 74% of the SFA.  He saw Dr. Fletcher Anon 02/2018.  He was felt to be stable and had no symptoms of claudication.  Therefore medical management was recommended.  Repeat Doppler 02/2019 revealed mild progression on the left with atherosclerosis in the common femoral, femoral, popliteal, and tibial arteries.  There was 50 to 74% stenosis of the proximal and distal left SFA.  There is no change on the right which continued to show 50 to 75% stenosis in the distal SFA.  The posterior tibial artery was occluded in both legs  but collateral flow was noted.  [name] saw Dr. Wynetta Emery on 04/05/2018.  His hemoglobin A1c was over 9%.  Levemir insulin was added.  However he never picked it up from the pharmacy because it was going to cost him over $200.  He never contacted Dr. Durenda Age office to let her know.  Since his last appointment [name] saw Dr. Fletcher Anon on 02/2019.  At that time he had numbness in his feet but no claudication or ulceration.  The symptoms were thought to be consistent with peripheral neuropathy.  There was concern about compliance with his medication regimen.  His blood pressure was elevated at that time but it was thought to be due to noncompliance.  He had dizziness that he attributed to a higher dose of rosuvastatin so this was reduced back to 20 mg daily.  [name] reports that he has been feeling well.  He has no chest pain or shortness of breath.  He has no lower extremity edema, orthopnea, or PND.  He walks his dog daily and is able to play 18 holes of golf he has been reading a book about diabetes and has been learning more about what he can eat.  He reports checking his blood pressure at times and states that it is "in a good range."  However he is unable to provide any specific numbers.  He also thinks his blood glucose is better controlled and attributes this to  the changes that he is made in his diet, such as eating apples and peanut butter for snacks.  Overall he is feeling well and is without complaint.    Past Medical History:  Diagnosis Date  . Angiodysplasia of small intestine 02/2016   terminal ileum  . Bleeding gastric ulcer 2000's  . Chronic anemia   . Coronary artery disease   . Diverticulosis   . GERD (gastroesophageal reflux disease)   . GI bleeding   . History of blood transfusion 2000's   "related to bleeding gastric ulcer"  . Hyperlipidemia   . Hypertension   . Non-compliance   . PVD (peripheral vascular disease) ([place])   . S/P coronary artery stent placement  10/03/2016  . Type II diabetes mellitus ([place])     Past Surgical History:  Procedure Laterality Date  . CARDIAC CATHETERIZATION N/A 04/24/2015   Procedure: Left Heart Cath and Coronary Angiography;  Surgeon: [name], MD;  Location: [place] [facility];  Service: Cardiovascular;  Laterality: N/A;  . CARDIAC CATHETERIZATION N/A 04/24/2015   Procedure: Coronary Stent Intervention;  Surgeon: [name], MD;  Location: [place] [facility];  Service: Cardiovascular;  Laterality: N/A;  Prox RCA  . CARDIAC CATHETERIZATION Right 05/29/2015   Procedure: Angioplasty Perioneal and SFA;  Surgeon: [name], MD;  Location: [place] [facility];  Service: Cardiovascular;  Laterality: Right;  . COLONOSCOPY Left 02/29/2016   Procedure: COLONOSCOPY;  Surgeon: Carol Ada, MD;  Location: [facility];  Service: Endoscopy;  Laterality: Left;  . COLONOSCOPY WITH PROPOFOL N/A 11/23/2015   Procedure: COLONOSCOPY WITH PROPOFOL;  Surgeon: Carol Ada, MD;  Location: [facility];  Service: Endoscopy;  Laterality: N/A;  . CORONARY ANGIOPLASTY    . ESOPHAGOGASTRODUODENOSCOPY (EGD) WITH PROPOFOL N/A 11/23/2015   Procedure: ESOPHAGOGASTRODUODENOSCOPY (EGD) WITH PROPOFOL;  Surgeon: Carol Ada, MD;  [facility]: [facility];  Service: Endoscopy;  Laterality: N/A;  . EXCISIONAL HEMORRHOIDECTOMY  1980's  . GIVENS CAPSULE STUDY N/A 02/29/2016   Procedure: GIVENS CAPSULE STUDY;  Surgeon: Carol Ada, MD;  Location: [facility];  Service: Endoscopy;  Laterality: N/A;  . LAPAROSCOPIC SMALL BOWEL RESECTION N/A 06/30/2017   Procedure: LAPAROSCOPIC SMALL BOWEL RESECTION;  Surgeon: Leighton Ruff, MD;  Location: [facility];  Service: General;  Laterality: N/A;  . PERIPHERAL VASCULAR CATHETERIZATION N/A 04/24/2015   Procedure: Lower Extremity Angiography;  Surgeon: [name], MD;  Location: [place] [facility];  Service: Cardiovascular;  Laterality: N/A;  . PERIPHERAL VASCULAR CATHETERIZATION N/A 05/29/2015   Procedure: Lower Extremity Angiography;   Surgeon: [name], MD;  Location: [place] [facility];  Service: Cardiovascular;  Laterality: N/A;  . PERIPHERAL VASCULAR CATHETERIZATION Left 05/29/2015   Procedure: Renal Angiography;  Surgeon: [name], MD;  Location: [place] [facility];  Service: Cardiovascular;  Laterality: Left;  . PERIPHERAL VASCULAR CATHETERIZATION Left 06/12/2015   Procedure: Peripheral Vascular Atherectomy;  Surgeon: [name], MD;  Location: [place] [facility];  Service: Cardiovascular;  Laterality: Left;  AT  . PERIPHERAL VASCULAR CATHETERIZATION Left 06/12/2015   Procedure: Peripheral Vascular Intervention;  Surgeon: [name], MD;  Location: [place] [facility];  Service: Cardiovascular;  Laterality: Left;  PTA TP TRUNK  . UPPER GI ENDOSCOPY  12/16/11   findings:  mild gastritis and duodenitis     Current Outpatient Medications  Medication Sig Dispense Refill  . amLODipine (NORVASC) 10 MG tablet TAKE 1 TABLET BY MOUTH EVERY DAY 90 tablet 0  . aspirin 81 MG tablet Take 1 tablet (81 mg total)  by mouth daily. 30 tablet 11  . bisoprolol (ZEBETA) 10 MG tablet Take 10 mg by mouth daily.    . chlorthalidone (HYGROTON) 25 MG tablet Take 1 tablet (25 mg total) by mouth daily. 90 tablet 2  . cloNIDine (CATAPRES) 0.1 MG tablet 1/2 tab PO BID 30 tablet 3  . dapagliflozin propanediol (FARXIGA) 5 MG TABS tablet Take 5 mg by mouth daily. 30 tablet 3  . ferrous sulfate 325 (65 FE) MG tablet TAKE 1 TABLET BY MOUTH 2 TIMES DAILY WITH A MEAL. 60 tablet 5  . Insulin Pen Needle (PEN NEEDLES) 31G X 6 MM MISC Use as directed 100 each 6  . isosorbide mononitrate (IMDUR) 30 MG 24 hr tablet Take 1 tablet (30 mg total) daily by mouth. 30 tablet 3  . metFORMIN (GLUCOPHAGE-XR) 500 MG 24 hr tablet Take 2 tablets (1,000 mg total) by mouth 2 (two) times daily. MUST MAKE APPT FOR FURTHER REFILLS 120 tablet 1  . rosuvastatin (CRESTOR) 40 MG tablet Take 1 tablet (40 mg total) by mouth at bedtime. 90 tablet 3  . Tdap (BOOSTRIX) 5-2.5-18.5  LF-MCG/0.5 injection Inject 0.5 mLs into the muscle as directed. 0.5 mL 0   No current facility-administered medications for this visit.    Allergies:   Patient has no known allergies.    Social History:  The patient  reports that he quit smoking about 4 years ago. His smoking use included cigars and cigarettes. He has a 54.00 pack-year smoking history. He has quit using smokeless tobacco.  His smokeless tobacco use included chew. He reports that he does not drink alcohol or use drugs.   Family History:  The patient's family history includes Alcohol abuse in his father; CVA in his unknown relative; Diabetes in his mother; Heart attack in his father; Hypertension in his father and unknown relative; Peripheral Artery Disease in his father.    ROS:  Please see the history of present illness.   Otherwise, review of systems are positive for none.   All other systems are reviewed and negative.    PHYSICAL EXAM: VS:  There were no vitals taken for this visit. , BMI There is no height or weight on file to calculate BMI. GENERAL:  Well appearing HEENT: Pupils equal round and reactive, fundi not visualized, oral mucosa unremarkable NECK:  No jugular venous distention, waveform within normal limits, carotid upstroke brisk and symmetric, no bruits LUNGS:  Clear to auscultation bilaterally HEART:  RRR.  PMI not displaced or sustained,S1 and S2 within normal limits, no S3, no S4, no clicks, no rubs, no murmurs ABD:  Flat, positive bowel sounds normal in frequency in pitch, no bruits, no rebound, no guarding, no midline pulsatile mass, no hepatomegaly, no splenomegaly EXT:  2 plus radial pulses, no edema, no cyanosis no clubbing SKIN:  No rashes no nodules NEURO:  Cranial nerves II through XII grossly intact, motor grossly intact throughout PSYCH:  Cognitively intact, oriented to person place and time   EKG:  EKG is ordered today. 09/18/17: Sinus rhythm.  Rate 67 bpm.  Cannot rule out prior septal  infarct. 04/21/18: Sinus rhythm.  Rate 75 bpm.  Prior anteroseptal and inferior infarcts.  LAD.  Echo 6/27/8: Study Conclusions  - Left ventricle: The cavity size was normal. Wall thickness was   increased in a pattern of moderate LVH. Systolic function was   normal. The estimated ejection fraction was in the range of 60%   to 65%. Wall motion was normal; there were  no regional wall   motion abnormalities. Doppler parameters are consistent with   abnormal left ventricular relaxation (grade 1 diastolic   dysfunction). - Left atrium: The atrium was mildly dilated. - Atrial septum: Mobile atrial septum no obvoius PFO consider f/u   bubble study if clinically indicated.  Lexiscan Myoview 10/24/16: The left ventricular ejection fraction is mildly decreased (45-54%).  Nuclear stress EF: 49%.  There was no ST segment deviation noted during stress.  The study is normal.  This is a low risk study.   ABI 10/21/16:  >50% right distal superficial femoral artery stenosis, s/p angioplasty. 30-49% left mid to distal superficial femoral artery stenosis. Three vessel run-off, bilaterally.   Recent Labs: 07/06/2019: ALT 15; BUN 10; Creatinine, Ser 1.28; Potassium 4.6; Sodium 136    Lipid Panel    Component Value Date/Time   CHOL 160 07/06/2019 0845   TRIG 124 07/06/2019 0845   HDL 50 07/06/2019 0845   CHOLHDL 3.2 07/06/2019 0845   CHOLHDL 3.7 10/31/2015 1559   VLDL 27 10/31/2015 1559   LDLCALC 88 07/06/2019 0845      Wt Readings from Last 3 Encounters:  03/15/19 198 lb 9.6 oz (90.1 kg)  07/06/18 202 lb (91.6 kg)  04/21/18 195 lb (88.5 kg)     ASSESSMENT AND PLAN:  # Hypertension:  Mr. Dollins reports that his blood pressure is well-controlled.  It is very difficult to determine what is truly going on.  He has a history of noncompliance and cannot give me any hard numbers today.  For now we will continue amlodipine, bisoprolol, chlorthalidone, clonidine, and Imdur.  I asked him to  bring a log to follow up  # CAD s/p LAD PCI:  # Hyperlipidemia: No chest pain. Stress test 10/2016 was negative for ischemia.  Continue aspirin, bisoprolol, Imdur, and rosuvastatin 20 mg daily.  He will return for fasting lipids and a CMP.  # Leg numbness: # PAD: Moderate disease bilaterally.  Continue rosuvastatin and aspirin.  Stop clopidogrel.  Blood pressure, lipid, and diabetes control will be extremely key in preventing this from progressing.  Continue follow up with Dr. Fletcher Anon.       Current medicines are reviewed at length with the patient today.  The patient does not have concerns regarding medicines.  The following changes have been made: None  Labs/ tests ordered today include:   No orders of the defined types were placed in this encounter.    Disposition:   FU with Torben Soloway C. Oval Linsey, MD, [facility] in 6 months.      Signed, Lorayne Getchell C. Oval Linsey, MD, [facility]  09/28/2019 8:38 AM    [place]

## 2019-11-07 ENCOUNTER — Encounter: Payer: Self-pay | Admitting: Cardiovascular Disease

## 2019-11-08 ENCOUNTER — Other Ambulatory Visit: Payer: Self-pay

## 2019-11-08 ENCOUNTER — Ambulatory Visit (INDEPENDENT_AMBULATORY_CARE_PROVIDER_SITE_OTHER): Payer: Medicare HMO | Admitting: Cardiovascular Disease

## 2019-11-08 ENCOUNTER — Encounter: Payer: Self-pay | Admitting: Cardiovascular Disease

## 2019-11-08 VITALS — BP 140/80 | HR 73 | Ht 67.5 in | Wt 183.0 lb

## 2019-11-08 DIAGNOSIS — E7800 Pure hypercholesterolemia, unspecified: Secondary | ICD-10-CM | POA: Diagnosis not present

## 2019-11-08 DIAGNOSIS — I739 Peripheral vascular disease, unspecified: Secondary | ICD-10-CM

## 2019-11-08 DIAGNOSIS — I2510 Atherosclerotic heart disease of native coronary artery without angina pectoris: Secondary | ICD-10-CM

## 2019-11-08 DIAGNOSIS — Z9861 Coronary angioplasty status: Secondary | ICD-10-CM

## 2019-11-08 DIAGNOSIS — I10 Essential (primary) hypertension: Secondary | ICD-10-CM

## 2019-11-08 DIAGNOSIS — Z5181 Encounter for therapeutic drug level monitoring: Secondary | ICD-10-CM | POA: Diagnosis not present

## 2019-11-08 MED ORDER — ROSUVASTATIN CALCIUM 40 MG PO TABS: 40.0000 mg | ORAL_TABLET | Freq: Every day | ORAL | 3 refills | Status: DC

## 2019-11-08 MED ORDER — CLONIDINE HCL 0.1 MG PO TABS: ORAL_TABLET | ORAL | 3 refills | Status: DC

## 2019-11-08 MED ORDER — AMLODIPINE BESYLATE 10 MG PO TABS: 10.0000 mg | ORAL_TABLET | Freq: Every day | ORAL | 3 refills | Status: DC

## 2019-11-08 MED ORDER — ISOSORBIDE MONONITRATE ER 30 MG PO TB24: 30.0000 mg | ORAL_TABLET | Freq: Every day | ORAL | 3 refills | Status: DC

## 2019-11-08 NOTE — Patient Instructions (Signed)
Medication Instructions:  Your physician recommends that you continue on your current medications as directed. Please refer to the Current Medication list given to you today.  *If you need a refill on your cardiac medications before your next appointment, please call your pharmacy*  Lab Work: LP/CMET TODAY   If you have labs (blood work) drawn today and your tests are completely normal, you will receive your results only by: Marland Kitchen MyChart Message (if you have MyChart) OR . A paper copy in the mail If you have any lab test that is abnormal or we need to change your treatment, we will call you to review the results.  Testing/Procedures: NONE   Follow-Up: At [facility], you and your health needs are our priority.  As part of our continuing mission to provide you with exceptional heart care, we have created designated Provider Care Teams.  These Care Teams include your primary Cardiologist (physician) and Advanced Practice Providers (APPs -  Physician Assistants and Nurse Practitioners) who all work together to provide you with the care you need, when you need it.  We recommend signing up for the patient portal called "MyChart".  Sign up information is provided on this After Visit Summary.  MyChart is used to connect with patients for Virtual Visits (Telemedicine).  Patients are able to view lab/test results, encounter notes, upcoming appointments, etc.  Non-urgent messages can be sent to your provider as well.   To learn more about what you can do with MyChart, go to [email or web address].    Your next appointment:   6 month(s)  You will receive a reminder letter in the mail two months in advance. If you don't receive a letter, please call our office to schedule the follow-up appointment.   The format for your next appointment:   In Person  Provider:   You may see DR [facility]  or one of the following Advanced Practice Providers on your designated Care Team:    Kerin Ransom,  PA-C  [place], [place]  Coletta Memos, [place]

## 2019-11-08 NOTE — Progress Notes (Signed)
Cardiology Office Note     Date:  11/08/2019   ID:  Jared, Green Jun 13, 1946, MRN [id number]  PCP:  [name], MD  Cardiologist:   Skeet Latch, MD   No chief complaint on file.    History of Present Illness: [name] is a 74 y.o. male with CAD s/p LAD PCI, PAD s/p peripheral stenting, hypertension, diabetes and non-compliance who presents for follow up.  He was first seen 09/2016. He was hospitalized 02/2016 with melena.   His hemoglobin was 7.1 at the time.  He declined work up and restarted Plavix on his own.  He underwent upper and lower endoscopy and was found to have diverticulosis but no source of bleeding.  No bleeding was found on capsule endoscopy at [facility].  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  Per a letter from his daughter and talking with his wife he is very noncompliant with all medications.  [name] was referred for [facility] 10/24/16 that revealed LVEF 49% with no ischemia and global hypokinesis. He was referred for an echocardiogram which has not yet occurred.  He also had ABIs that revealed greater than 50% right distal SFA stenosis status post angioplasty and 30-49% left mid to distal superficial SFA stenosis.  Repeat Dopplers 02/03/2018 revealed mild progression with 50 to 74% stenosis in the right SFA and 30 to 49% stenosis in the proximal peroneal artery.  On the left he had 50 to 74% of the SFA.  He saw Dr. Fletcher Anon 02/2018.  He was felt to be stable and had no symptoms of claudication.  Therefore medical management was recommended.  Repeat Doppler 02/2019 revealed mild progression on the left with atherosclerosis in the common femoral, femoral, popliteal, and tibial arteries.  There was 50 to 74% stenosis of the proximal and distal left SFA.  There is no change on the right which continued to show 50 to 75% stenosis in the distal SFA.  The posterior tibial artery was occluded in both legs but collateral  flow was noted.  [name] saw Dr. Wynetta Emery on 04/05/2018.  His hemoglobin A1c was over 9%.  Levemir insulin was added.  However he never picked it up from the pharmacy because it was going to cost him over $200.  He never contacted Dr. Durenda Age office to let her know.  [name] saw Dr. Fletcher Anon on 02/2019.  At that time he had numbness in his feet but no claudication or ulceration.  The symptoms were thought to be consistent with peripheral neuropathy.  There was concern about compliance with his medication regimen.  His blood pressure was elevated at that time but it was thought to be due to noncompliance.    Since his last appointment [name] has been well.  He walks his dog daily.  He lost his BP machine and is unsure what his BP has been doing lately.  He has been taking his medication but has been out of clonidine for the last month.  Overall he is feeling well.  He has been losing weight by working on his diet and exercise.  He denies lower extremity edema, orthopnea, or PND.  Rarely, he has pain in his legs when he walks.  It does not limit his ability to exercise.   Past Medical History:  Diagnosis Date  . Angiodysplasia of small intestine 02/2016   terminal ileum  . Bleeding gastric ulcer 2000's  . Chronic anemia   . Coronary  artery disease   . Diverticulosis   . GERD (gastroesophageal reflux disease)   . GI bleeding   . History of blood transfusion 2000's   "related to bleeding gastric ulcer"  . Hyperlipidemia   . Hypertension   . Non-compliance   . PVD (peripheral vascular disease) ([place])   . S/P coronary artery stent placement 10/03/2016  . Type II diabetes mellitus ([place])     Past Surgical History:  Procedure Laterality Date  . CARDIAC CATHETERIZATION N/A 04/24/2015   Procedure: Left Heart Cath and Coronary Angiography;  Surgeon: [name], MD;  Location: [place] [facility];  Service: Cardiovascular;  Laterality: N/A;  . CARDIAC CATHETERIZATION N/A 04/24/2015   Procedure:  Coronary Stent Intervention;  Surgeon: [name], MD;  Location: [place] [facility];  Service: Cardiovascular;  Laterality: N/A;  Prox RCA  . CARDIAC CATHETERIZATION Right 05/29/2015   Procedure: Angioplasty Perioneal and SFA;  Surgeon: [name], MD;  Location: [place] [facility];  Service: Cardiovascular;  Laterality: Right;  . COLONOSCOPY Left 02/29/2016   Procedure: COLONOSCOPY;  Surgeon: Carol Ada, MD;  [facility];  Service: Endoscopy;  Laterality: Left;  . COLONOSCOPY WITH PROPOFOL N/A 11/23/2015   Procedure: COLONOSCOPY WITH PROPOFOL;  Surgeon: Carol Ada, MD;  [facility];  Service: Endoscopy;  Laterality: N/A;  . CORONARY ANGIOPLASTY    . ESOPHAGOGASTRODUODENOSCOPY (EGD) WITH PROPOFOL N/A 11/23/2015   Procedure: ESOPHAGOGASTRODUODENOSCOPY (EGD) WITH PROPOFOL;  Surgeon: Carol Ada, MD;  [facility];  Service: Endoscopy;  Laterality: N/A;  . EXCISIONAL HEMORRHOIDECTOMY  1980's  . GIVENS CAPSULE STUDY N/A 02/29/2016   Procedure: GIVENS CAPSULE STUDY;  Surgeon: Carol Ada, MD;  [facility];  Service: Endoscopy;  Laterality: N/A;  . LAPAROSCOPIC SMALL BOWEL RESECTION N/A 06/30/2017   Procedure: LAPAROSCOPIC SMALL BOWEL RESECTION;  Surgeon: Leighton Ruff, MD;  [facility];  Service: General;  Laterality: N/A;  . PERIPHERAL VASCULAR CATHETERIZATION N/A 04/24/2015   Procedure: Lower Extremity Angiography;  Surgeon: [name], MD;  Location: [place] [facility];  Service: Cardiovascular;  Laterality: N/A;  . PERIPHERAL VASCULAR CATHETERIZATION N/A 05/29/2015   Procedure: Lower Extremity Angiography;  Surgeon: [name], MD;  Location: [place] [facility];  Service: Cardiovascular;  Laterality: N/A;  . PERIPHERAL VASCULAR CATHETERIZATION Left 05/29/2015   Procedure: Renal Angiography;  Surgeon: [name], MD;  Location: [place] [facility];  Service: Cardiovascular;  Laterality: Left;  . PERIPHERAL VASCULAR CATHETERIZATION Left 06/12/2015    Procedure: Peripheral Vascular Atherectomy;  Surgeon: [name], MD;  Location: [place] [facility];  Service: Cardiovascular;  Laterality: Left;  AT  . PERIPHERAL VASCULAR CATHETERIZATION Left 06/12/2015   Procedure: Peripheral Vascular Intervention;  Surgeon: [name], MD;  Location: [place] [facility];  Service: Cardiovascular;  Laterality: Left;  PTA TP TRUNK  . UPPER GI ENDOSCOPY  12/16/11   findings:  mild gastritis and duodenitis     Current Outpatient Medications  Medication Sig Dispense Refill  . amLODipine (NORVASC) 10 MG tablet Take 1 tablet (10 mg total) by mouth daily. 90 tablet 3  . aspirin 81 MG tablet Take 1 tablet (81 mg total) by mouth daily. 30 tablet 11  . bisoprolol (ZEBETA) 10 MG tablet Take 10 mg by mouth daily.    . chlorthalidone (HYGROTON) 25 MG tablet Take 1 tablet (25 mg total) by mouth daily. 90 tablet 2  . cloNIDine (CATAPRES) 0.1 MG tablet 1/2 tab PO BID 30 tablet 3  . dapagliflozin propanediol (FARXIGA) 5  MG TABS tablet Take 5 mg by mouth daily. 30 tablet 3  . ferrous sulfate 325 (65 FE) MG tablet TAKE 1 TABLET BY MOUTH 2 TIMES DAILY WITH A MEAL. 60 tablet 5  . Insulin Pen Needle (PEN NEEDLES) 31G X 6 MM MISC Use as directed 100 each 6  . isosorbide mononitrate (IMDUR) 30 MG 24 hr tablet Take 1 tablet (30 mg total) by mouth daily. 30 tablet 3  . metFORMIN (GLUCOPHAGE-XR) 500 MG 24 hr tablet Take 2 tablets (1,000 mg total) by mouth 2 (two) times daily. MUST MAKE APPT FOR FURTHER REFILLS 120 tablet 1  . rosuvastatin (CRESTOR) 40 MG tablet Take 1 tablet (40 mg total) by mouth at bedtime. 90 tablet 3  . Tdap (BOOSTRIX) 5-2.5-18.5 LF-MCG/0.5 injection Inject 0.5 mLs into the muscle as directed. 0.5 mL 0   No current facility-administered medications for this visit.    Allergies:   Patient has no known allergies.    Social History:  The patient  reports that he quit smoking about 4 years ago. His smoking use included cigars and cigarettes. He has a 54.00  pack-year smoking history. He has quit using smokeless tobacco.  His smokeless tobacco use included chew. He reports that he does not drink alcohol or use drugs.   Family History:  The patient's family history includes Alcohol abuse in his father; CVA in his unknown relative; Diabetes in his mother; Heart attack in his father; Hypertension in his father and unknown relative; Peripheral Artery Disease in his father.    ROS:  Please see the history of present illness.   Otherwise, review of systems are positive for none.   All other systems are reviewed and negative.    PHYSICAL EXAM: VS:  BP 140/80   Pulse 73   Ht 5' 7.5" (1.715 m)   Wt 183 lb (83 kg)   SpO2 96%   BMI 28.24 kg/m  , BMI Body mass index is 28.24 kg/m. GENERAL:  Well appearing HEENT: Pupils equal round and reactive, fundi not visualized, oral mucosa unremarkable NECK:  No jugular venous distention, waveform within normal limits, carotid upstroke brisk and symmetric, no bruits LUNGS:  Clear to auscultation bilaterally HEART:  RRR.  PMI not displaced or sustained,S1 and S2 within normal limits, no S3, no S4, no clicks, no rubs, no murmurs ABD:  Flat, positive bowel sounds normal in frequency in pitch, no bruits, no rebound, no guarding, no midline pulsatile mass, no hepatomegaly, no splenomegaly EXT:  2 plus radial pulses, no edema, no cyanosis no clubbing SKIN:  No rashes no nodules NEURO:  Cranial nerves II through XII grossly intact, motor grossly intact throughout PSYCH:  Cognitively intact, oriented to person place and time   EKG:  EKG is ordered today. 09/18/17: Sinus rhythm.  Rate 67 bpm.  Cannot rule out prior septal infarct. 04/21/18: Sinus rhythm.  Rate 75 bpm.  Prior anteroseptal and inferior infarcts.  LAD. 11/08/19: Sinus rhythm.  Rate 73 bpm.  Left axis deviation.  Prior anteroseptal infarct.  Echo 6/27/8: Study Conclusions  - Left ventricle: The cavity size was normal. Wall thickness was   increased in a  pattern of moderate LVH. Systolic function was   normal. The estimated ejection fraction was in the range of 60%   to 65%. Wall motion was normal; there were no regional wall   motion abnormalities. Doppler parameters are consistent with   abnormal left ventricular relaxation (grade 1 diastolic   dysfunction). - Left atrium: The  atrium was mildly dilated. - Atrial septum: Mobile atrial septum no obvoius PFO consider f/u   bubble study if clinically indicated.  Lexiscan Myoview 10/24/16: The left ventricular ejection fraction is mildly decreased (45-54%).  Nuclear stress EF: 49%.  There was no ST segment deviation noted during stress.  The study is normal.  This is a low risk study.   ABI 10/21/16:  >50% right distal superficial femoral artery stenosis, s/p angioplasty. 30-49% left mid to distal superficial femoral artery stenosis. Three vessel run-off, bilaterally.   Recent Labs: 07/06/2019: ALT 15; BUN 10; Creatinine, Ser 1.28; Potassium 4.6; Sodium 136    Lipid Panel    Component Value Date/Time   CHOL 160 07/06/2019 0845   TRIG 124 07/06/2019 0845   HDL 50 07/06/2019 0845   CHOLHDL 3.2 07/06/2019 0845   CHOLHDL 3.7 10/31/2015 1559   VLDL 27 10/31/2015 1559   LDLCALC 88 07/06/2019 0845      Wt Readings from Last 3 Encounters:  11/08/19 183 lb (83 kg)  03/15/19 198 lb 9.6 oz (90.1 kg)  07/06/18 202 lb (91.6 kg)     ASSESSMENT AND PLAN:  # Hypertension:  M blood pressure is above goal.  We will refill his clonidine.  His blood pressure should be less than 130/80.  Continue amlodipine, bisoprolol, chlorthalidone, and Imdur.  He is going to work on finding his old blood pressure cuff or getting a new one so that he can track it more regularly.  # CAD s/p LAD PCI:  # Hyperlipidemia: No chest pain. Stress test 10/2016 was negative for ischemia.  Continue aspirin, bisoprolol, Imdur, and rosuvastatin 40mg .  Check fasting lipids and CMP today.  LDL should be less than  70.  # Leg numbness: # PAD: Moderate disease bilaterally.  Continue rosuvastatin and aspirin.  Blood pressure, lipid, and diabetes control will be extremely key in preventing this from progressing.  Continue follow up with Dr. Fletcher Anon.      Current medicines are reviewed at length with the patient today.  The patient does not have concerns regarding medicines.  The following changes have been made: None  Labs/ tests ordered today include:   Orders Placed This Encounter  Procedures  . EKG 12-Lead     Disposition:   FU with Carolann Brazell C. Oval Linsey, MD, [facility] in 6 months.      Signed, Chong January C. Oval Linsey, MD, [facility]  11/08/2019 12:16 PM    [place]

## 2019-11-09 LAB — LIPID PANEL
Chol/HDL Ratio: 2.6 ratio (ref 0.0–5.0)
Cholesterol, Total: 97 mg/dL — ABNORMAL LOW (ref 100–199)
HDL: 38 mg/dL — ABNORMAL LOW (ref >39)
LDL Chol Calc (NIH): 41 mg/dL (ref 0–99)
Triglycerides: 95 mg/dL (ref 0–149)
VLDL Cholesterol Cal: 18 mg/dL (ref 5–40)

## 2019-11-09 LAB — COMPREHENSIVE METABOLIC PANEL
ALT: 18 IU/L (ref 0–44)
AST: 19 IU/L (ref 0–40)
Albumin/Globulin Ratio: 1.5 (ref 1.2–2.2)
Albumin: 4.5 g/dL (ref 3.7–4.7)
Alkaline Phosphatase: 60 IU/L (ref 39–117)
BUN/Creatinine Ratio: 13 (ref 10–24)
BUN: 19 mg/dL (ref 8–27)
Bilirubin Total: 0.6 mg/dL (ref 0.0–1.2)
CO2: 25 mmol/L (ref 20–29)
Calcium: 9.8 mg/dL (ref 8.6–10.2)
Chloride: 93 mmol/L — ABNORMAL LOW (ref 96–106)
Creatinine, Ser: 1.45 mg/dL — ABNORMAL HIGH (ref 0.76–1.27)
GFR calc Af Amer: 54 mL/min/{1.73_m2} — ABNORMAL LOW (ref >59)
GFR calc non Af Amer: 47 mL/min/{1.73_m2} — ABNORMAL LOW (ref >59)
Globulin, Total: 3 g/dL (ref 1.5–4.5)
Glucose: 369 mg/dL — ABNORMAL HIGH (ref 65–99)
Potassium: 3.6 mmol/L (ref 3.5–5.2)
Sodium: 135 mmol/L (ref 134–144)
Total Protein: 7.5 g/dL (ref 6.0–8.5)

## 2019-11-16 ENCOUNTER — Telehealth: Payer: Self-pay

## 2019-11-16 DIAGNOSIS — I10 Essential (primary) hypertension: Secondary | ICD-10-CM

## 2019-11-16 NOTE — Telephone Encounter (Signed)
Spoke to patient recent lab results given.Advised to decrease chlorthalidone to 25 mg 1/2 tablet daily.Repeat bmet in 2 weeks lab order mailed.Advised to call PCP Dr.Deborah Wynetta Emery tomorrow 4/1 about elevated blood sugar.

## 2019-11-17 ENCOUNTER — Other Ambulatory Visit: Payer: Self-pay

## 2019-11-17 DIAGNOSIS — I10 Essential (primary) hypertension: Secondary | ICD-10-CM

## 2020-02-16 ENCOUNTER — Telehealth: Payer: Self-pay | Admitting: *Deleted

## 2020-02-16 NOTE — Telephone Encounter (Signed)
A message was left, re: his follow up visit. 

## 2020-03-09 ENCOUNTER — Other Ambulatory Visit: Payer: Self-pay

## 2020-03-09 ENCOUNTER — Ambulatory Visit (HOSPITAL_COMMUNITY)
Admission: RE | Admit: 2020-03-09 | Discharge: 2020-03-09 | Disposition: A | Discharge status: Home / Self Care | Payer: Medicare HMO | Source: Ambulatory Visit | Attending: Cardiovascular Disease | Admitting: Cardiovascular Disease

## 2020-03-09 DIAGNOSIS — Z9862 Peripheral vascular angioplasty status: Secondary | ICD-10-CM | POA: Diagnosis not present

## 2020-03-09 DIAGNOSIS — I739 Peripheral vascular disease, unspecified: Secondary | ICD-10-CM | POA: Insufficient documentation

## 2020-03-13 ENCOUNTER — Ambulatory Visit (INDEPENDENT_AMBULATORY_CARE_PROVIDER_SITE_OTHER): Payer: Medicare HMO | Admitting: Cardiovascular Disease

## 2020-03-13 ENCOUNTER — Other Ambulatory Visit: Payer: Self-pay

## 2020-03-13 ENCOUNTER — Encounter: Payer: Self-pay | Admitting: Cardiovascular Disease

## 2020-03-13 VITALS — BP 140/78 | HR 77 | Ht 67.0 in | Wt 176.0 lb

## 2020-03-13 DIAGNOSIS — E785 Hyperlipidemia, unspecified: Secondary | ICD-10-CM

## 2020-03-13 DIAGNOSIS — I10 Essential (primary) hypertension: Secondary | ICD-10-CM

## 2020-03-13 DIAGNOSIS — I2510 Atherosclerotic heart disease of native coronary artery without angina pectoris: Secondary | ICD-10-CM | POA: Diagnosis not present

## 2020-03-13 DIAGNOSIS — I739 Peripheral vascular disease, unspecified: Secondary | ICD-10-CM

## 2020-03-13 NOTE — Patient Instructions (Signed)

## 2020-03-13 NOTE — Progress Notes (Signed)
Cardiology Office Note   Date:  03/13/2020   ID:  [name] 1945/10/17, MRN [id number]  PCP:  [name], MD  Cardiologist: Dr. Oval Linsey  No chief complaint on file.     History of Present Illness: [name] is a 74 y.o. male who is here for a follow-up visit regarding peripheral arterial disease. He has known history of coronary artery disease status post RCA PCI, PAD with previous PTA, essential hypertension and poorly controlled diabetes.  He had prior GI bleed Status post partial small bowel resection in November 2018 for a carcinoid tumor. He has history of poor compliance in the past which has improved.  The patient had angiography done in 2016 by Dr. [name].  It showed patent renal arteries. There was 99% stenosis in the right mid SFA which was treated with drug-coated balloon angioplasty.  He also had angioplasty of the right peroneal artery.  He then underwent staged PTA and atherectomy of the left anterior tibial artery.   He had recent noninvasive vascular studies which showed an ABI of 0.94 on the right and 0.76 on the left.  Previous ABI on the left was 0.82.  Duplex showed moderate right SFA stenosis with peak velocity of 218.  On the left, there was severe proximal SFA stenosis with peak velocity of 518 with occluded posterior tibial artery.  He denies chest pain or shortness of breath.  He reports mild left calf and foot soreness and numbness which only happens after prolonged walking.  It is noticeable when he mow his lawn or plays nine-hole golf.  He does not have symptoms any other time.  No lower extremity ulceration.      Past Medical History:  Diagnosis Date  . Angiodysplasia of small intestine 02/2016   terminal ileum  . Bleeding gastric ulcer 2000's  . Chronic anemia   . Coronary artery disease   . Diverticulosis   . GERD (gastroesophageal reflux disease)   . GI bleeding   . History of blood transfusion 2000's   "related to  bleeding gastric ulcer"  . Hyperlipidemia   . Hypertension   . Non-compliance   . PVD (peripheral vascular disease) ([place])   . S/P coronary artery stent placement 10/03/2016  . Type II diabetes mellitus ([place])     Past Surgical History:  Procedure Laterality Date  . CARDIAC CATHETERIZATION N/A 04/24/2015   Procedure: Left Heart Cath and Coronary Angiography;  Surgeon: [name], MD;  Location: [place] [facility];  Service: Cardiovascular;  Laterality: N/A;  . CARDIAC CATHETERIZATION N/A 04/24/2015   Procedure: Coronary Stent Intervention;  Surgeon: [name], MD;  Location: [place] [facility];  Service: Cardiovascular;  Laterality: N/A;  Prox RCA  . CARDIAC CATHETERIZATION Right 05/29/2015   Procedure: Angioplasty Perioneal and SFA;  Surgeon: [name], MD;  Location: [place] [facility];  Service: Cardiovascular;  Laterality: Right;  . COLONOSCOPY Left 02/29/2016   Procedure: COLONOSCOPY;  Surgeon: Carol Ada, MD;  [facility];  Service: Endoscopy;  Laterality: Left;  . COLONOSCOPY WITH PROPOFOL N/A 11/23/2015   Procedure: COLONOSCOPY WITH PROPOFOL;  Surgeon: Carol Ada, MD;  [facility];  Service: Endoscopy;  Laterality: N/A;  . CORONARY ANGIOPLASTY    . ESOPHAGOGASTRODUODENOSCOPY (EGD) WITH PROPOFOL N/A 11/23/2015   Procedure: ESOPHAGOGASTRODUODENOSCOPY (EGD) WITH PROPOFOL;  Surgeon: Carol Ada, MD;  [facility];  Service: Endoscopy;  Laterality: N/A;  . EXCISIONAL HEMORRHOIDECTOMY  1980's  . GIVENS CAPSULE STUDY N/A 02/29/2016  Procedure: GIVENS CAPSULE STUDY;  Surgeon: Carol Ada, MD;  [facility];  Service: Endoscopy;  Laterality: N/A;  . LAPAROSCOPIC SMALL BOWEL RESECTION N/A 06/30/2017   Procedure: LAPAROSCOPIC SMALL BOWEL RESECTION;  Surgeon: Leighton Ruff, MD;  [facility];  Service: General;  Laterality: N/A;  . PERIPHERAL VASCULAR CATHETERIZATION N/A 04/24/2015   Procedure: Lower Extremity Angiography;  Surgeon: [name], MD;   Location: [place] [facility];  Service: Cardiovascular;  Laterality: N/A;  . PERIPHERAL VASCULAR CATHETERIZATION N/A 05/29/2015   Procedure: Lower Extremity Angiography;  Surgeon: [name], MD;  Location: [place] [facility];  Service: Cardiovascular;  Laterality: N/A;  . PERIPHERAL VASCULAR CATHETERIZATION Left 05/29/2015   Procedure: Renal Angiography;  Surgeon: [name], MD;  Location: [place] [facility];  Service: Cardiovascular;  Laterality: Left;  . PERIPHERAL VASCULAR CATHETERIZATION Left 06/12/2015   Procedure: Peripheral Vascular Atherectomy;  Surgeon: [name], MD;  Location: [place] [facility];  Service: Cardiovascular;  Laterality: Left;  AT  . PERIPHERAL VASCULAR CATHETERIZATION Left 06/12/2015   Procedure: Peripheral Vascular Intervention;  Surgeon: [name], MD;  Location: [place] [facility];  Service: Cardiovascular;  Laterality: Left;  PTA TP TRUNK  . UPPER GI ENDOSCOPY  12/16/11   findings:  mild gastritis and duodenitis     Current Outpatient Medications  Medication Sig Dispense Refill  . amLODipine (NORVASC) 10 MG tablet Take 1 tablet (10 mg total) by mouth daily. 180 tablet 3  . aspirin 81 MG tablet Take 1 tablet (81 mg total) by mouth daily. 30 tablet 11  . bisoprolol (ZEBETA) 10 MG tablet Take 10 mg by mouth daily.    . chlorthalidone (HYGROTON) 25 MG tablet Take 1/2 tablet ( 12.5 mg ) daily    . cloNIDine (CATAPRES) 0.1 MG tablet 1/2 tab PO BID 30 tablet 3  . ferrous sulfate 325 (65 FE) MG tablet TAKE 1 TABLET BY MOUTH 2 TIMES DAILY WITH A MEAL. 60 tablet 5  . isosorbide mononitrate (IMDUR) 30 MG 24 hr tablet Take 1 tablet (30 mg total) by mouth daily. 30 tablet 3  . metFORMIN (GLUCOPHAGE-XR) 500 MG 24 hr tablet Take 2 tablets (1,000 mg total) by mouth 2 (two) times daily. MUST MAKE APPT FOR FURTHER REFILLS 120 tablet 1  . rosuvastatin (CRESTOR) 40 MG tablet Take 1 tablet (40 mg total) by mouth at bedtime. 90 tablet 3  . Tdap (BOOSTRIX) 5-2.5-18.5 LF-MCG/0.5  injection Inject 0.5 mLs into the muscle as directed. 0.5 mL 0   No current facility-administered medications for this visit.    Allergies:   Patient has no known allergies.    Social History:  The patient  reports that he quit smoking about 5 years ago. His smoking use included cigars and cigarettes. He has a 54.00 pack-year smoking history. He has quit using smokeless tobacco.  His smokeless tobacco use included chew. He reports that he does not drink alcohol and does not use drugs.   Family History:  The patient's family history includes Alcohol abuse in his father; CVA in his unknown relative; Diabetes in his mother; Heart attack in his father; Hypertension in his father and unknown relative; Peripheral Artery Disease in his father.    ROS:  Please see the history of present illness.   Otherwise, review of systems are positive for none.   All other systems are reviewed and negative.    PHYSICAL EXAM: VS:  BP (!) 140/78   Pulse 77   Ht 5\' 7"  (1.702  m)   Wt 176 lb (79.8 kg)   SpO2 99%   BMI 27.57 kg/m  , BMI Body mass index is 27.57 kg/m. GEN: Well nourished, well developed, in no acute distress  HEENT: normal  Neck: no JVD, carotid bruits, or masses Cardiac: RRR; no murmurs, rubs, or gallops,no edema  Respiratory:  clear to auscultation bilaterally, normal work of breathing GI: soft, nontender, nondistended, + BS MS: no deformity or atrophy  Skin: warm and dry, no rash Neuro:  Strength and sensation are intact Psych: euthymic mood, full affect   EKG:  EKG is not ordered today.   Recent Labs: 11/08/2019: ALT 18; BUN 19; Creatinine, Ser 1.45; Potassium 3.6; Sodium 135    Lipid Panel    Component Value Date/Time   CHOL 97 (L) 11/08/2019 1211   TRIG 95 11/08/2019 1211   HDL 38 (L) 11/08/2019 1211   CHOLHDL 2.6 11/08/2019 1211   CHOLHDL 3.7 10/31/2015 1559   VLDL 27 10/31/2015 1559   LDLCALC 41 11/08/2019 1211      Wt Readings from Last 3 Encounters:  03/13/20  176 lb (79.8 kg)  11/08/19 183 lb (83 kg)  03/15/19 198 lb 9.6 oz (90.1 kg)      No flowsheet data found.    ASSESSMENT AND PLAN:  1.  Peripheral arterial disease with previous endovascular intervention on bilateral lower extremity.  There is progression of severe proximal left SFA stenosis.  However, in spite of that, he only reports mild left leg claudication and he does not feel very limited by this.  I discussed with him the natural history and management of claudication.  He will continue to be active.  I advised him to increase his walking.  If symptoms worsen, recommend proceeding with angiography and possible endovascular intervention.  We will reevaluate his symptoms in 6 months.  2.  Coronary artery disease involving native coronary arteries without angina: Continue aggressive medical therapy.  3.  Essential hypertension: Blood pressure is reasonably controlled.  4.  Hyperlipidemia: Continue treatment with rosuvastatin.  Most recent lipid profile showed an LDL of 41.    Disposition:   FU with me in 6 months  Signed,  Kathlyn Sacramento, MD  03/13/2020 11:37 AM    [place]

## 2020-06-11 ENCOUNTER — Telehealth: Payer: Self-pay | Admitting: Cardiovascular Disease

## 2020-06-11 ENCOUNTER — Telehealth: Payer: Self-pay

## 2020-06-11 NOTE — Telephone Encounter (Signed)
ATC pt x 2 at both phone #s listed/on file to schedule needed follow-up w/ PCP, no answer, LM at both phone #s for pt to [facility] to either schedule or inform if no longer being seen at [facility] for primary care.

## 2020-06-11 NOTE — Telephone Encounter (Signed)
  *  STAT* If patient is at the pharmacy, call can be transferred to refill team.   1. Which medications need to be refilled? (please list name of each medication and dose if known)  metFORMIN (GLUCOPHAGE-XR) 500 MG 24 hr tablet rosuvastatin (CRESTOR) 40 MG tablet amLODipine (NORVASC) 10 MG tablet  2. Which pharmacy/location (including street and city if local pharmacy) is medication to be sent to? [id number] - [place], [place] - [address]  3. Do they need a 30 day or 90 day supply? 90 with refills  Patient is almost out of medication. He especially needs the Metformin and would like Dr. Oval Linsey to write him an rx

## 2020-06-12 MED ORDER — ROSUVASTATIN CALCIUM 40 MG PO TABS: 40.0000 mg | ORAL_TABLET | Freq: Every day | ORAL | 3 refills | Status: DC

## 2020-06-12 MED ORDER — AMLODIPINE BESYLATE 10 MG PO TABS: 10.0000 mg | ORAL_TABLET | Freq: Every day | ORAL | 3 refills | Status: DC

## 2020-06-12 NOTE — Telephone Encounter (Signed)
Left message for patient to call back about his refill request.

## 2020-06-14 NOTE — Telephone Encounter (Signed)
LM x2 at both phone #s on file, will send contact letter via [employer] and mail to address on file.

## 2020-09-06 ENCOUNTER — Encounter: Payer: Self-pay | Admitting: Internal Medicine

## 2020-09-06 ENCOUNTER — Other Ambulatory Visit: Payer: Self-pay

## 2020-09-06 ENCOUNTER — Ambulatory Visit: Payer: Medicare HMO | Attending: Internal Medicine | Admitting: Internal Medicine

## 2020-09-06 VITALS — BP 120/74 | HR 75 | Temp 98.2°F | Resp 16 | Ht 67.5 in | Wt 183.4 lb

## 2020-09-06 DIAGNOSIS — Z9114 Patient's other noncompliance with medication regimen: Secondary | ICD-10-CM

## 2020-09-06 DIAGNOSIS — N1831 Chronic kidney disease, stage 3a: Secondary | ICD-10-CM | POA: Diagnosis not present

## 2020-09-06 DIAGNOSIS — E118 Type 2 diabetes mellitus with unspecified complications: Secondary | ICD-10-CM | POA: Diagnosis not present

## 2020-09-06 DIAGNOSIS — E663 Overweight: Secondary | ICD-10-CM | POA: Diagnosis not present

## 2020-09-06 DIAGNOSIS — I739 Peripheral vascular disease, unspecified: Secondary | ICD-10-CM | POA: Diagnosis not present

## 2020-09-06 DIAGNOSIS — IMO0002 Reserved for concepts with insufficient information to code with codable children: Secondary | ICD-10-CM

## 2020-09-06 DIAGNOSIS — I2510 Atherosclerotic heart disease of native coronary artery without angina pectoris: Secondary | ICD-10-CM | POA: Diagnosis not present

## 2020-09-06 DIAGNOSIS — E1165 Type 2 diabetes mellitus with hyperglycemia: Secondary | ICD-10-CM | POA: Diagnosis not present

## 2020-09-06 DIAGNOSIS — I25119 Atherosclerotic heart disease of native coronary artery with unspecified angina pectoris: Secondary | ICD-10-CM | POA: Insufficient documentation

## 2020-09-06 DIAGNOSIS — Z23 Encounter for immunization: Secondary | ICD-10-CM

## 2020-09-06 DIAGNOSIS — Z862 Personal history of diseases of the blood and blood-forming organs and certain disorders involving the immune mechanism: Secondary | ICD-10-CM | POA: Diagnosis not present

## 2020-09-06 DIAGNOSIS — I10 Essential (primary) hypertension: Secondary | ICD-10-CM | POA: Diagnosis not present

## 2020-09-06 DIAGNOSIS — Z91148 Patient's other noncompliance with medication regimen for other reason: Secondary | ICD-10-CM

## 2020-09-06 LAB — POCT GLYCOSYLATED HEMOGLOBIN (HGB A1C): HbA1c, POC (controlled diabetic range): 11.3 % — AB (ref 0.0–7.0)

## 2020-09-06 LAB — GLUCOSE, POCT (MANUAL RESULT ENTRY): POC Glucose: 307 mg/dl — AB (ref 70–99)

## 2020-09-06 MED ORDER — ROSUVASTATIN CALCIUM 40 MG PO TABS: 40.0000 mg | ORAL_TABLET | Freq: Every day | ORAL | 3 refills | Status: DC

## 2020-09-06 MED ORDER — AMLODIPINE BESYLATE 5 MG PO TABS: 5.0000 mg | ORAL_TABLET | Freq: Every day | ORAL | 6 refills | Status: DC

## 2020-09-06 MED ORDER — BISOPROLOL FUMARATE 5 MG PO TABS: 5.0000 mg | ORAL_TABLET | Freq: Every day | ORAL | 6 refills | Status: DC

## 2020-09-06 MED ORDER — METFORMIN HCL 500 MG PO TABS: 500.0000 mg | ORAL_TABLET | Freq: Two times a day (BID) | ORAL | 6 refills | Status: DC

## 2020-09-06 MED ORDER — DAPAGLIFLOZIN PROPANEDIOL 5 MG PO TABS: 5.0000 mg | ORAL_TABLET | Freq: Every day | ORAL | 6 refills | Status: DC

## 2020-09-06 NOTE — Progress Notes (Signed)
Patient ID: [name], male    DOB: 02/10/1946  MRN: [id number]  CC: Diabetes and Hypertension   Subjective: [name] is a 75 y.o. male who presents for chronic ds management.  Last seen 06/2018 His concerns today include:  Pt with hx of IDA due to occult small bowel bleedingwith resection of benign neuroendocrine tumor 06/2017, CAD with hx of PCI, PVD, CKD, HTN, HL, DM type 2  Has not been seen in over 2 years.  Patient states he did not feel the need to come in because he has been doing and feeling fine.  DIABETES TYPE 2 Last A1C:   A1C 11.3/BS 307 Med Adherence:  []  Yes    [x]  No - got of Metformin for a while.  Farxiiga added to Metformin when I last saw him.  He does not recall it and is not on either medicine.  Requesting refill on metformin today. Medication side effects:  []  Yes    []  No Home Monitoring?  []  Yes    [x]  No but reports that he does have testing supplies. Home glucose results range: Diet Adherence: He thinks he is doing good with eating habits.  Reports adequate portion sizes. Exercise: [x]  Yes  -walks with his dog daily Hypoglycemic episodes?: []  Yes    []  No Numbness of the feet? []  Yes    [x]  No Retinopathy hx? []  Yes    []  No Last eye exam:  No blurred vision.   Comments: denies polyuria, or polydipsia  HYPERTENSION/HL/CAD/PVD Currently taking: see medication list Med Adherence: []  Yes    [x]  No -supposed to be on chlorthalidone, clonidine, rosuvastatin, amlodipine 10 mg daily, bisoprolol 10 mg daily and aspirin.  He tells me that he has only been taking amlodipine.  Has some of the other medications but does not feel that he needs them and takes them only occasionally.  Saw cardiologist back in July of last year.  They were under the impression that he was taking medications. Medication side effects: []  Yes    [x]  No Adherence with salt restriction: [x]  Yes    []  No Home Monitoring?: []  Yes    [x]  No Monitoring Frequency: []  Yes    []   No Home BP results range: []  Yes    []  No SOB? []  Yes    [x]  No Chest Pain?: []  Yes    [x]  No Leg swelling?: []  Yes    [x]  No Headaches?: []  Yes    [x]  No Dizziness? []  Yes    [x]  No Comments: no palpitations.  No claudication symptoms in legs.  He has appt with Vascular next wk  Went over with patient the fact that his kidney function is not 100% based on last several GFR's.  He still makes good urine.  Patient Active Problem List   Diagnosis Date Noted  . Influenza vaccine needed 09/06/2020  . Benign neuroendocrine tumor of small intestine 09/02/2017  . Hypertensive cardiovascular disease 06/25/2017  . PVC's (premature ventricular contractions) 01/26/2017  . CAD S/P percutaneous coronary angioplasty 10/03/2016  . Chest pain with high risk for cardiac etiology 04/22/2015  . Claudication in peripheral vascular disease ([place]) 04/22/2015  . Anemia, iron deficiency 10/17/2014  . Essential hypertension 10/17/2014  . DM2 (diabetes mellitus, type 2) ([place]) 04/08/2012  . PUD (peptic ulcer disease) 11/11/2011     Current Outpatient Medications on File Prior to Visit  Medication Sig Dispense Refill  . aspirin 81 MG tablet Take 1 tablet (81 mg  total) by mouth daily. 30 tablet 11  . ferrous sulfate 325 (65 FE) MG tablet TAKE 1 TABLET BY MOUTH 2 TIMES DAILY WITH A MEAL. 60 tablet 5   No current facility-administered medications on file prior to visit.    No Known Allergies  Social History   Socioeconomic History  . Marital status: Married    Spouse name: Not on file  . Number of children: Not on file  . Years of education: Not on file  . Highest education level: Not on file  Occupational History  . Not on file  Tobacco Use  . Smoking status: Former Smoker    Packs/day: 1.00    Years: 54.00    Pack years: 54.00    Types: Cigars, Cigarettes    Quit date: 02/16/2015    Years since quitting: 5.5  . Smokeless tobacco: Former Systems developer    Types: Chew  . Tobacco comment: "chewed some when  I was young"  Substance and Sexual Activity  . Alcohol use: No    Alcohol/week: 0.0 standard drinks    Comment: 04/24/2015 "no beer in 2 wks; ;no liquor in 3-4 weeks; I'm not a real heavy drinker" states he stopped drining 2 yrs ago  . Drug use: No  . Sexual activity: Never  Other Topics Concern  . Not on file  Social History Narrative  . Not on file   Social Determinants of Health   Financial Resource Strain: Not on file  Food Insecurity: Not on file  Transportation Needs: Not on file  Physical Activity: Not on file  Stress: Not on file  Social Connections: Not on file  Intimate Partner Violence: Not on file    Family History  Problem Relation Age of Onset  . CVA Unknown   . Hypertension Unknown   . Diabetes Mother   . Hypertension Father   . Peripheral Artery Disease Father   . Alcohol abuse Father   . Heart attack Father     Past Surgical History:  Procedure Laterality Date  . CARDIAC CATHETERIZATION N/A 04/24/2015   Procedure: Left Heart Cath and Coronary Angiography;  Surgeon: [name], MD;  Location: [facility];  Service: Cardiovascular;  Laterality: N/A;  . CARDIAC CATHETERIZATION N/A 04/24/2015   Procedure: Coronary Stent Intervention;  Surgeon: [name], MD;  Location: [facility];  Service: Cardiovascular;  Laterality: N/A;  Prox RCA  . CARDIAC CATHETERIZATION Right 05/29/2015   Procedure: Angioplasty Perioneal and SFA;  Surgeon: [name], MD;  Location: [facility];  Service: Cardiovascular;  Laterality: Right;  . COLONOSCOPY Left 02/29/2016   Procedure: COLONOSCOPY;  Surgeon: Carol Ada, MD;  Location: [facility];  Service: Endoscopy;  Laterality: Left;  . COLONOSCOPY WITH PROPOFOL N/A 11/23/2015   Procedure: COLONOSCOPY WITH PROPOFOL;  Surgeon: Carol Ada, MD;  Location: [facility];  Service: Endoscopy;  Laterality: N/A;  . CORONARY ANGIOPLASTY    . ESOPHAGOGASTRODUODENOSCOPY (EGD) WITH PROPOFOL N/A 11/23/2015   Procedure:  ESOPHAGOGASTRODUODENOSCOPY (EGD) WITH PROPOFOL;  Surgeon: Carol Ada, MD;  Location: [facility];  Service: Endoscopy;  Laterality: N/A;  . EXCISIONAL HEMORRHOIDECTOMY  1980's  . GIVENS CAPSULE STUDY N/A 02/29/2016   Procedure: GIVENS CAPSULE STUDY;  Surgeon: Carol Ada, MD;  Location: [facility];  Service: Endoscopy;  Laterality: N/A;  . LAPAROSCOPIC SMALL BOWEL RESECTION N/A 06/30/2017   Procedure: LAPAROSCOPIC SMALL BOWEL RESECTION;  Surgeon: Leighton Ruff, MD;  Location: [facility];  Service: General;  Laterality: N/A;  . PERIPHERAL VASCULAR CATHETERIZATION  N/A 04/24/2015   Procedure: Lower Extremity Angiography;  Surgeon: [name], MD;  Location: [facility];  Service: Cardiovascular;  Laterality: N/A;  . PERIPHERAL VASCULAR CATHETERIZATION N/A 05/29/2015   Procedure: Lower Extremity Angiography;  Surgeon: [name], MD;  Location: [facility];  Service: Cardiovascular;  Laterality: N/A;  . PERIPHERAL VASCULAR CATHETERIZATION Left 05/29/2015   Procedure: Renal Angiography;  Surgeon: [name], MD;  Location: [facility];  Service: Cardiovascular;  Laterality: Left;  . PERIPHERAL VASCULAR CATHETERIZATION Left 06/12/2015   Procedure: Peripheral Vascular Atherectomy;  Surgeon: [name], MD;  Location: [facility];  Service: Cardiovascular;  Laterality: Left;  AT  . PERIPHERAL VASCULAR CATHETERIZATION Left 06/12/2015   Procedure: Peripheral Vascular Intervention;  Surgeon: [name], MD;  Location: [facility];  Service: Cardiovascular;  Laterality: Left;  PTA TP TRUNK  . UPPER GI ENDOSCOPY  12/16/11   findings:  mild gastritis and duodenitis    ROS: Review of Systems Negative except as stated above  PHYSICAL EXAM: BP 120/74   Pulse 75   Temp 98.2 F (36.8 C)   Resp 16   Ht 5' 7.5" (1.715 m)   Wt 183 lb 6.4 oz (83.2 kg)   SpO2 97%   BMI 28.30 kg/m   Wt Readings from Last 3 Encounters:  09/06/20 183 lb 6.4 oz (83.2 kg)  03/13/20 176 lb (79.8  kg)  11/08/19 183 lb (83 kg)    Physical Exam  General appearance - alert, well appearing, and in no distress Mental status - normal mood, behavior, speech, dress, motor activity, and thought processes Eyes - pupils equal and reactive, extraocular eye movements intact Neck - supple, no significant adenopathy Chest - clear to auscultation, no wheezes, rales or rhonchi, symmetric air entry Heart - normal rate, regular rhythm, normal S1, S2, no murmurs, rubs, clicks or gallops Extremities - peripheral pulses normal, no pedal edema, no clubbing or cyanosis Diabetic Foot Exam - Simple   Simple Foot Form Visual Inspection See comments: Yes Sensation Testing Intact to touch and monofilament testing bilaterally: Yes Pulse Check Posterior Tibialis and Dorsalis pulse intact bilaterally: Yes Comments Toenails mildly overgrown.     CMP Latest Ref Rng & Units 11/08/2019 07/06/2019 07/06/2018  Glucose 65 - 99 mg/dL 369(H) 289(H) 216(H)  BUN 8 - 27 mg/dL 19 10 15   Creatinine 0.76 - 1.27 mg/dL 1.45(H) 1.28(H) 1.45(H)  Sodium 134 - 144 mmol/L 135 136 136  Potassium 3.5 - 5.2 mmol/L 3.6 4.6 4.2  Chloride 96 - 106 mmol/L 93(L) 98 97  CO2 20 - 29 mmol/L 25 24 22   Calcium 8.6 - 10.2 mg/dL 9.8 9.6 9.8  Total Protein 6.0 - 8.5 g/dL 7.5 7.6 7.7  Total Bilirubin 0.0 - 1.2 mg/dL 0.6 0.6 0.5  Alkaline Phos 39 - 117 IU/L 60 55 55  AST 0 - 40 IU/L 19 13 17   ALT 0 - 44 IU/L 18 15 15    Lipid Panel     Component Value Date/Time   CHOL 97 (L) 11/08/2019 1211   TRIG 95 11/08/2019 1211   HDL 38 (L) 11/08/2019 1211   CHOLHDL 2.6 11/08/2019 1211   CHOLHDL 3.7 10/31/2015 1559   VLDL 27 10/31/2015 1559   LDLCALC 41 11/08/2019 1211    CBC    Component Value Date/Time   WBC 6.1 04/05/2018 1030   WBC 7.2 07/04/2017 0600   RBC 4.73 04/05/2018 1030   RBC 3.89 (L) 07/04/2017 0600  HGB 14.6 04/05/2018 1030   HCT 44.5 04/05/2018 1030   PLT 221 04/05/2018 1030   MCV 94 04/05/2018 1030   MCH 30.9  04/05/2018 1030   MCH 26.2 07/04/2017 0600   MCHC 32.8 04/05/2018 1030   MCHC 31.3 07/04/2017 0600   RDW 12.6 04/05/2018 1030   LYMPHSABS 1.1 07/04/2017 0600   LYMPHSABS 0.9 01/28/2017 0957   MONOABS 0.6 07/04/2017 0600   EOSABS 0.1 07/04/2017 0600   EOSABS 0.1 01/28/2017 0957   BASOSABS 0.1 07/04/2017 0600   BASOSABS 0.1 01/28/2017 0957    ASSESSMENT AND PLAN: 1. Diabetes mellitus type 2, uncontrolled, with complications ([place]) Not at goal due to medication noncompliance.  Went over complications of diabetes that can occur due to prolonged uncontrolled disease including kidney disease, retinopathy, peripheral neuropathy etc. -Based on current A1c I recommend low-dose metformin and start Lantus with it.  Patient declines any injectables.  We will therefore do low-dose metformin with Wilder Glade. -Encouraged him to continue healthy eating habits and daily exercise. - POCT glucose (manual entry) - POCT glycosylated hemoglobin (Hb A1C) - Microalbumin / creatinine urine ratio - Ambulatory referral to Ophthalmology - CBC - Comprehensive metabolic panel - Lipid panel - metFORMIN (GLUCOPHAGE) 500 MG tablet; Take 1 tablet (500 mg total) by mouth 2 (two) times daily with a meal.  Dispense: 60 tablet; Refill: 6 - dapagliflozin propanediol (FARXIGA) 5 MG TABS tablet; Take 1 tablet (5 mg total) by mouth daily before breakfast.  Dispense: 30 tablet; Refill: 6  2. Essential hypertension Blood pressure looks good despite the fact that he has not been compliant with taking his medications.  The only medicine he told me he has been taking consistently is amlodipine.  Therefore I will decrease the dose of amlodipine to 5 mg and add back a low-dose of bisoprolol.  I have discontinued all the other blood pressure medicines including chlorthalidone, clonidine and isosorbide as he has not been taking. - amLODipine (NORVASC) 5 MG tablet; Take 1 tablet (5 mg total) by mouth daily.  Dispense: 30 tablet; Refill:  6 - bisoprolol (ZEBETA) 5 MG tablet; Take 1 tablet (5 mg total) by mouth daily.  Dispense: 30 tablet; Refill: 6  3. Coronary artery disease involving native coronary artery of native heart without angina pectoris Stable without symptoms.  Restart Crestor, aspirin and bisoprolol - rosuvastatin (CRESTOR) 40 MG tablet; Take 1 tablet (40 mg total) by mouth at bedtime.  Dispense: 90 tablet; Refill: 3  4. PAD (peripheral artery disease) ([facility]) Clinically stable.  5. Stage 3a chronic kidney disease ([place]) Discussed CKD.  Discussed the importance of good diabetes and blood pressure control to help prevent progression.  We will have to see what his creatinine and GFR looks like today to determine whether it would be safe to continue with metformin.  For now I have refilled metformin at 500 mg twice a day instead of the thousand twice a day.  6. History of anemia   7. Influenza vaccine needed 8. Need for immunization against influenza Given - Flu Vaccine QUAD 36+ mos IM  9. History of medication noncompliance Encourage compliance with medications to prevent progression of disease  10. Overweight (BMI 25.0-29.9) See #1 above     Patient was given the opportunity to ask questions.  Patient verbalized understanding of the plan and was able to repeat key elements of the plan.   Orders Placed This Encounter  Procedures  . Flu Vaccine QUAD 36+ mos IM  . Microalbumin / creatinine urine  ratio  . CBC  . Comprehensive metabolic panel  . Lipid panel  . Ambulatory referral to Ophthalmology  . POCT glucose (manual entry)  . POCT glycosylated hemoglobin (Hb A1C)     Requested Prescriptions   Signed Prescriptions Disp Refills  . amLODipine (NORVASC) 5 MG tablet 30 tablet 6    Sig: Take 1 tablet (5 mg total) by mouth daily.  . bisoprolol (ZEBETA) 5 MG tablet 30 tablet 6    Sig: Take 1 tablet (5 mg total) by mouth daily.  . rosuvastatin (CRESTOR) 40 MG tablet 90 tablet 3    Sig: Take 1 tablet  (40 mg total) by mouth at bedtime.  . metFORMIN (GLUCOPHAGE) 500 MG tablet 60 tablet 6    Sig: Take 1 tablet (500 mg total) by mouth 2 (two) times daily with a meal.  . dapagliflozin propanediol (FARXIGA) 5 MG TABS tablet 30 tablet 6    Sig: Take 1 tablet (5 mg total) by mouth daily before breakfast.    Return in about 3 months (around 12/05/2020) for Give appt with Lurena Joiner in 1 mth for Medicare Wellness Visit.  Karle Plumber, MD, FACP

## 2020-09-06 NOTE — Progress Notes (Signed)
[id number] a1c-11.3

## 2020-09-06 NOTE — Patient Instructions (Addendum)
We have restarted 2 of your blood pressure medications the amlodipine and bisoprolol.  You should restart your cholesterol medicine called rosuvastatin.  For your diabetes, we have restarted metformin and have added another medication to that called [place].  Try to check your blood sugars at least twice a day before meals.  The goal is to be 90-130.  We will schedule you a follow-up visit in 1 month with our clinical pharmacist to do your Medicare wellness visit.  Influenza Virus Vaccine injection (Fluarix) What is this medicine? INFLUENZA VIRUS VACCINE (in floo EN zuh VAHY ruhs vak SEEN) helps to reduce the risk of getting influenza also known as the flu. This medicine may be used for other purposes; ask your health care provider or pharmacist if you have questions. COMMON BRAND NAME(S): Fluarix, Fluzone What should I tell my health care provider before I take this medicine? They need to know if you have any of these conditions:  bleeding disorder like hemophilia  fever or infection  Guillain-Barre syndrome or other neurological problems  immune system problems  infection with the human immunodeficiency virus (HIV) or AIDS  low blood platelet counts  multiple sclerosis  an unusual or allergic reaction to influenza virus vaccine, eggs, chicken proteins, latex, gentamicin, other medicines, foods, dyes or preservatives  pregnant or trying to get pregnant  breast-feeding How should I use this medicine? This vaccine is for injection into a muscle. It is given by a health care professional. A copy of Vaccine Information Statements will be given before each vaccination. Read this sheet carefully each time. The sheet may change frequently. Talk to your pediatrician regarding the use of this medicine in children. Special care may be needed. Overdosage: If you think you have taken too much of this medicine contact a poison control center or emergency room at once. NOTE: This medicine  is only for you. Do not share this medicine with others. What if I miss a dose? This does not apply. What may interact with this medicine?  chemotherapy or radiation therapy  medicines that lower your immune system like etanercept, anakinra, infliximab, and adalimumab  medicines that treat or prevent blood clots like warfarin  phenytoin  steroid medicines like prednisone or cortisone  theophylline  vaccines This list may not describe all possible interactions. Give your health care provider a list of all the medicines, herbs, non-prescription drugs, or dietary supplements you use. Also tell them if you smoke, drink alcohol, or use illegal drugs. Some items may interact with your medicine. What should I watch for while using this medicine? Report any side effects that do not go away within 3 days to your doctor or health care professional. Call your health care provider if any unusual symptoms occur within 6 weeks of receiving this vaccine. You may still catch the flu, but the illness is not usually as bad. You cannot get the flu from the vaccine. The vaccine will not protect against colds or other illnesses that may cause fever. The vaccine is needed every year. What side effects may I notice from receiving this medicine? Side effects that you should report to your doctor or health care professional as soon as possible:  allergic reactions like skin rash, itching or hives, swelling of the face, lips, or tongue Side effects that usually do not require medical attention (report to your doctor or health care professional if they continue or are bothersome):  fever  headache  muscle aches and pains  pain, tenderness, redness, or  swelling at site where injected  weak or tired This list may not describe all possible side effects. Call your doctor for medical advice about side effects. You may report side effects to FDA at 1-800-FDA-1088. Where should I keep my medicine? This vaccine  is only given in a clinic, pharmacy, doctor's office, or other health care setting and will not be stored at home. NOTE: This sheet is a summary. It may not cover all possible information. If you have questions about this medicine, talk to your doctor, pharmacist, or health care provider.  2021 Elsevier/Gold Standard (2008-03-01 09:30:40)

## 2020-09-07 LAB — COMPREHENSIVE METABOLIC PANEL
ALT: 13 IU/L (ref 0–44)
AST: 24 IU/L (ref 0–40)
Albumin/Globulin Ratio: 1.3 (ref 1.2–2.2)
Albumin: 4.7 g/dL (ref 3.7–4.7)
Alkaline Phosphatase: 63 IU/L (ref 44–121)
BUN/Creatinine Ratio: 11 (ref 10–24)
BUN: 18 mg/dL (ref 8–27)
Bilirubin Total: 0.6 mg/dL (ref 0.0–1.2)
CO2: 23 mmol/L (ref 20–29)
Calcium: 9.8 mg/dL (ref 8.6–10.2)
Chloride: 96 mmol/L (ref 96–106)
Creatinine, Ser: 1.6 mg/dL — ABNORMAL HIGH (ref 0.76–1.27)
GFR calc Af Amer: 48 mL/min/{1.73_m2} — ABNORMAL LOW (ref >59)
GFR calc non Af Amer: 42 mL/min/{1.73_m2} — ABNORMAL LOW (ref >59)
Globulin, Total: 3.7 g/dL (ref 1.5–4.5)
Glucose: 291 mg/dL — ABNORMAL HIGH (ref 65–99)
Sodium: 135 mmol/L (ref 134–144)
Total Protein: 8.4 g/dL (ref 6.0–8.5)

## 2020-09-07 LAB — CBC
Hematocrit: 42.7 % (ref 37.5–51.0)
Hemoglobin: 14.3 g/dL (ref 13.0–17.7)
MCH: 30.2 pg (ref 26.6–33.0)
MCHC: 33.5 g/dL (ref 31.5–35.7)
MCV: 90 fL (ref 79–97)
Platelets: 231 10*3/uL (ref 150–450)
RBC: 4.73 x10E6/uL (ref 4.14–5.80)
RDW: 12 % (ref 11.6–15.4)
WBC: 8.4 10*3/uL (ref 3.4–10.8)

## 2020-09-07 LAB — LIPID PANEL
Chol/HDL Ratio: 2.2 ratio (ref 0.0–5.0)
Cholesterol, Total: 115 mg/dL (ref 100–199)
HDL: 52 mg/dL (ref >39)
LDL Chol Calc (NIH): 48 mg/dL (ref 0–99)
Triglycerides: 75 mg/dL (ref 0–149)
VLDL Cholesterol Cal: 15 mg/dL (ref 5–40)

## 2020-09-07 LAB — MICROALBUMIN / CREATININE URINE RATIO
Creatinine, Urine: 143.9 mg/dL
Microalb/Creat Ratio: 13 mg/g creat (ref 0–29)
Microalbumin, Urine: 18.8 ug/mL

## 2020-09-08 ENCOUNTER — Other Ambulatory Visit: Payer: Self-pay | Admitting: Internal Medicine

## 2020-09-08 DIAGNOSIS — N1831 Chronic kidney disease, stage 3a: Secondary | ICD-10-CM

## 2020-09-08 NOTE — Progress Notes (Signed)
Let patient know that he does not have any significant amount of protein buildup in the urine which is good. His blood cell counts including red blood cell, white blood cell and platelet counts are normal. His kidney function is not 100% and slightly worse compared to when it was checked back in March of last year. Make sure that he is drinking several glasses of water daily. Avoid taking over-the-counter pain medications like ibuprofen, Motrin, Aleve, Advil, Naprosyn as these can make kidney function worse. Would like for him to return to the lab after he has been on metformin for about 1 month for repeat kidney function test. Liver function tests normal. Cholesterol level normal.

## 2020-09-11 ENCOUNTER — Encounter: Payer: Self-pay | Admitting: Cardiovascular Disease

## 2020-09-11 ENCOUNTER — Ambulatory Visit (INDEPENDENT_AMBULATORY_CARE_PROVIDER_SITE_OTHER): Payer: Medicare HMO | Admitting: Cardiovascular Disease

## 2020-09-11 ENCOUNTER — Other Ambulatory Visit: Payer: Self-pay

## 2020-09-11 VITALS — BP 136/76 | HR 74 | Ht 67.5 in | Wt 183.8 lb

## 2020-09-11 DIAGNOSIS — I739 Peripheral vascular disease, unspecified: Secondary | ICD-10-CM

## 2020-09-11 DIAGNOSIS — I2510 Atherosclerotic heart disease of native coronary artery without angina pectoris: Secondary | ICD-10-CM

## 2020-09-11 DIAGNOSIS — I10 Essential (primary) hypertension: Secondary | ICD-10-CM | POA: Diagnosis not present

## 2020-09-11 DIAGNOSIS — E785 Hyperlipidemia, unspecified: Secondary | ICD-10-CM | POA: Diagnosis not present

## 2020-09-11 NOTE — Progress Notes (Signed)
Cardiology Office Note   Date:  09/11/2020   ID:  Lindell, Renfrew [birth year], MRN [id number]  PCP:  [name], MD  Cardiologist: Dr. Oval Linsey  No chief complaint on file.     History of Present Illness: [name] is a 75 y.o. male who is here for a follow-up visit regarding peripheral arterial disease. He has known history of coronary artery disease status post RCA PCI, PAD with previous PTA, essential hypertension and poorly controlled diabetes.  He had prior GI bleed Status post partial small bowel resection in November 2018 for a carcinoid tumor. He has history of poor compliance in the past which has improved.  The patient had angiography done in 2016 by Dr. [name].  It showed patent renal arteries. There was 99% stenosis in the right mid SFA which was treated with drug-coated balloon angioplasty.  He also had angioplasty of the right peroneal artery.  He then underwent staged PTA and atherectomy of the left anterior tibial artery.   Most recent noninvasive vascular studies in July 2021 showed an ABI of 0.94 on the right and 0.76 on the left.  Previous ABI on the left was 0.82.  Duplex showed moderate right SFA stenosis with peak velocity of 218.  On the left, there was severe proximal SFA stenosis with peak velocity of 518 with occluded posterior tibial artery.  He has been doing well with no chest pain, shortness of breath or palpitations.  He denies any leg claudication.  He continues to play golf almost on a daily basis with no significant limitations.      Past Medical History:  Diagnosis Date  . Angiodysplasia of small intestine ([place]) 02/2016   terminal ileum  . Bleeding gastric ulcer 2000's  . Chronic anemia   . Coronary artery disease   . Diverticulosis   . GERD (gastroesophageal reflux disease)   . GI bleeding   . History of blood transfusion 2000's   "related to bleeding gastric ulcer"  . Hyperlipidemia   . Hypertension   . Non-compliance    . PVD (peripheral vascular disease) ([place])   . S/P coronary artery stent placement 10/03/2016  . Type II diabetes mellitus ([place])     Past Surgical History:  Procedure Laterality Date  . CARDIAC CATHETERIZATION N/A 04/24/2015   Procedure: Left Heart Cath and Coronary Angiography;  Surgeon: [name], MD;  Location: [facility];  Service: Cardiovascular;  Laterality: N/A;  . CARDIAC CATHETERIZATION N/A 04/24/2015   Procedure: Coronary Stent Intervention;  Surgeon: [name], MD;  Location: [facility];  Service: Cardiovascular;  Laterality: N/A;  Prox RCA  . CARDIAC CATHETERIZATION Right 05/29/2015   Procedure: Angioplasty Perioneal and SFA;  Surgeon: [name], MD;  Location: [facility];  Service: Cardiovascular;  Laterality: Right;  . COLONOSCOPY Left 02/29/2016   Procedure: COLONOSCOPY;  Surgeon: Carol Ada, MD;  [facility]: [facility];  Service: Endoscopy;  Laterality: Left;  . COLONOSCOPY WITH PROPOFOL N/A 11/23/2015   Procedure: COLONOSCOPY WITH PROPOFOL;  Surgeon: Carol Ada, MD;  [facility]: [facility];  Service: Endoscopy;  Laterality: N/A;  . CORONARY ANGIOPLASTY    . ESOPHAGOGASTRODUODENOSCOPY (EGD) WITH PROPOFOL N/A 11/23/2015   Procedure: ESOPHAGOGASTRODUODENOSCOPY (EGD) WITH PROPOFOL;  Surgeon: Carol Ada, MD;  [facility];  Service: Endoscopy;  Laterality: N/A;  . EXCISIONAL HEMORRHOIDECTOMY  1980's  . GIVENS CAPSULE STUDY N/A 02/29/2016   Procedure: GIVENS CAPSULE STUDY;  Surgeon: Carol Ada, MD;  [facility];  Service: Endoscopy;  Laterality: N/A;  . LAPAROSCOPIC SMALL BOWEL RESECTION N/A 06/30/2017   Procedure: LAPAROSCOPIC SMALL BOWEL RESECTION;  Surgeon: Leighton Ruff, MD;  Location: [facility];  Service: General;  Laterality: N/A;  . PERIPHERAL VASCULAR CATHETERIZATION N/A 04/24/2015   Procedure: Lower Extremity Angiography;  Surgeon: [name], MD;  Location: [facility];  Service: Cardiovascular;  Laterality: N/A;  .  PERIPHERAL VASCULAR CATHETERIZATION N/A 05/29/2015   Procedure: Lower Extremity Angiography;  Surgeon: [name], MD;  Location: [facility];  Service: Cardiovascular;  Laterality: N/A;  . PERIPHERAL VASCULAR CATHETERIZATION Left 05/29/2015   Procedure: Renal Angiography;  Surgeon: [name], MD;  Location: [facility];  Service: Cardiovascular;  Laterality: Left;  . PERIPHERAL VASCULAR CATHETERIZATION Left 06/12/2015   Procedure: Peripheral Vascular Atherectomy;  Surgeon: [name], MD;  Location: [facility];  Service: Cardiovascular;  Laterality: Left;  AT  . PERIPHERAL VASCULAR CATHETERIZATION Left 06/12/2015   Procedure: Peripheral Vascular Intervention;  Surgeon: [name], MD;  Location: [facility];  Service: Cardiovascular;  Laterality: Left;  PTA TP TRUNK  . UPPER GI ENDOSCOPY  12/16/11   findings:  mild gastritis and duodenitis     Current Outpatient Medications  Medication Sig Dispense Refill  . amLODipine (NORVASC) 5 MG tablet Take 1 tablet (5 mg total) by mouth daily. 30 tablet 6  . aspirin 81 MG tablet Take 1 tablet (81 mg total) by mouth daily. 30 tablet 11  . bisoprolol (ZEBETA) 5 MG tablet Take 1 tablet (5 mg total) by mouth daily. 30 tablet 6  . dapagliflozin propanediol (FARXIGA) 5 MG TABS tablet Take 1 tablet (5 mg total) by mouth daily before breakfast. 30 tablet 6  . ferrous sulfate 325 (65 FE) MG tablet TAKE 1 TABLET BY MOUTH 2 TIMES DAILY WITH A MEAL. 60 tablet 5  . metFORMIN (GLUCOPHAGE) 500 MG tablet Take 1 tablet (500 mg total) by mouth 2 (two) times daily with a meal. 60 tablet 6  . rosuvastatin (CRESTOR) 40 MG tablet Take 1 tablet (40 mg total) by mouth at bedtime. 90 tablet 3   No current facility-administered medications for this visit.    Allergies:   Patient has no known allergies.    Social History:  The patient  reports that he quit smoking about 5 years ago. His smoking use included cigars and cigarettes. He has a 54.00  pack-year smoking history. He has quit using smokeless tobacco.  His smokeless tobacco use included chew. He reports that he does not drink alcohol and does not use drugs.   Family History:  The patient's family history includes Alcohol abuse in his father; CVA in his unknown relative; Diabetes in his mother; Heart attack in his father; Hypertension in his father and unknown relative; Peripheral Artery Disease in his father.    ROS:  Please see the history of present illness.   Otherwise, review of systems are positive for none.   All other systems are reviewed and negative.    PHYSICAL EXAM: VS:  BP 136/76   Pulse 74   Ht 5' 7.5" (1.715 m)   Wt 183 lb 12.8 oz (83.4 kg)   BMI 28.36 kg/m  , BMI Body mass index is 28.36 kg/m. GEN: Well nourished, well developed, in no acute distress  HEENT: normal  Neck: no JVD, carotid bruits, or masses Cardiac: RRR; no murmurs, rubs, or gallops,no edema  Respiratory:  clear to auscultation bilaterally, normal work of breathing GI:  soft, nontender, nondistended, + BS MS: no deformity or atrophy  Skin: warm and dry, no rash Neuro:  Strength and sensation are intact Psych: euthymic mood, full affect   EKG:  EKG is ordered today. EKG showed normal sinus rhythm with first-degree AV block, incomplete right bundle branch block and possible old anteroseptal infarct.  Recent Labs: 09/06/2020: ALT 13; BUN 18; Creatinine, Ser 1.60; Hemoglobin 14.3; Platelets 231; Potassium CANCELED; Sodium 135    Lipid Panel    Component Value Date/Time   CHOL 115 09/06/2020 1152   TRIG 75 09/06/2020 1152   HDL 52 09/06/2020 1152   CHOLHDL 2.2 09/06/2020 1152   CHOLHDL 3.7 10/31/2015 1559   VLDL 27 10/31/2015 1559   LDLCALC 48 09/06/2020 1152      Wt Readings from Last 3 Encounters:  09/11/20 183 lb 12.8 oz (83.4 kg)  09/06/20 183 lb 6.4 oz (83.2 kg)  03/13/20 176 lb (79.8 kg)      No flowsheet data found.    ASSESSMENT AND PLAN:  1.  Peripheral  arterial disease with previous endovascular intervention on bilateral lower extremity.  There is progression of severe proximal left SFA stenosis.  However, he continues to be asymptomatic and thus I recommend continued aggressive medical therapy.  He continues to be very active.    2.  Coronary artery disease involving native coronary arteries without angina: Continue aggressive medical therapy.  3.  Essential hypertension: Blood pressure is reasonably controlled.  4.  Hyperlipidemia: Continue treatment with rosuvastatin.  I reviewed recent labs from last week which showed an LDL of 48.  Renal function was slightly worse than before with a creatinine of 1.6.    Disposition:   FU with me in 6 months  Signed,  Kathlyn Sacramento, MD  09/11/2020 8:27 AM    [place]

## 2020-09-11 NOTE — Patient Instructions (Signed)

## 2020-10-08 ENCOUNTER — Ambulatory Visit: Payer: Medicare HMO | Admitting: Pharmacist

## 2020-12-10 ENCOUNTER — Ambulatory Visit: Payer: Medicare HMO | Admitting: Internal Medicine

## 2021-01-01 ENCOUNTER — Other Ambulatory Visit: Payer: Self-pay

## 2021-01-01 NOTE — Patient Outreach (Signed)
Aging Gracefully Program  01/01/2021  [name] 06-11-1946 [id number]  [facility] Evaluation Interviewer attempted to call patient on today regarding Aging Gracefully referral. No answer from patient after multiple rings. CMA left confidential voicemail for patient to return call.  Will attempt to call back within 1 week.   [place] Management Assistant [phone]

## 2021-01-01 NOTE — Telephone Encounter (Signed)
This encounter was created in error - please disregard.

## 2021-01-03 ENCOUNTER — Other Ambulatory Visit: Payer: Self-pay

## 2021-01-03 NOTE — Patient Outreach (Signed)
Aging Gracefully Program  01/03/2021  Kasten Leveque 07-May-1946 [id number]  [facility] Evaluation Interviewer made contact with patient. Aging Gracefully survey completed.   Interviewer will send referral to Tomasa Rand, RN and OT for follow up.   [place] Management Assistant [phone]

## 2021-01-17 ENCOUNTER — Other Ambulatory Visit: Payer: Self-pay

## 2021-01-17 ENCOUNTER — Other Ambulatory Visit: Payer: Self-pay | Admitting: Occupational Therapy

## 2021-01-17 NOTE — Patient Outreach (Signed)
Aging Gracefully Program  OT Initial Visit  01/17/2021  [name] 06-08-1946 [id number]  Visit:  1- Initial Visit  Start Time:  1300 End Time:  1400 Total Minutes:  60  CCAP: Typical Daily Routine: What Types Of Care Problems Are You Having Throughout The Day?: Stepping in and out of the tub, mild decreased balance at times, increased time to get off the floor after playing with their dog [name], bending over to pick items off the floor/ground. What Kind Of Help Do You Receive?: none What Do You Think Would Make Everyday Life Easier For You?: Walk in shower, higher toilets Patient Reported Equipment: Patient Reported Equipment Currently Used:  (none) Functional Mobility-Stooping, Crouching, Kneeling To Retreive Item: Stooping, Crouching, or Kneeling To Retrieve Item: A Little Difficulty Do You:: No Device/No Assistance Importance Of Learning New Strategies:: A Little Functional Mobility-Bending From Standing Position To Pick Up Clothing Off The Floor: Bending Over From Standing Position To Pick Up Clothing Off The Floor: A Little Difficulty Do You:: No Device/No Assistance Importance Of Learning New Strategies:: A Little Functional Mobility-Move In And Out Of Bath/Shower: Move In And Out Of A Bath/Shower: A Little Difficulty Do You:: No Device/No Assistance Importance Of Learning New Strategies:: Moderate Intervention: Yes Other Comments:: Needs an accessible walk in shower Functional Mobility-Get On And Off Toilet: Getting Up From The Floor: Moderate Difficulty Do You:: Use Personal Assistance Importance Of Learning New Strategies:: Moderate Intervention: Yes Other Comments:: will pracitce this at one of our sessions  Readiness To Change Score:  Readiness to Change Score: 10  Home Environment Assessment: Outside Home Entry:: Front has several steps with railing on right unsecured. Back is fairly level  with one step in-- diffiult to get to back door in one area due to  width of driveway with boat parked in that area. No door bell. Kitchen:: floor at sink is rotting per clients and daughter Bathroom:: [place] bathroom has small tub, flooring needs replacing, walls need painted, vanity is loose, toilet is low, hole in celing from something falling through from attic.  Master bath--Mr Green Pulling has started working on it, toilet is out and so is vanity, there is a tub/shower combination. Smoke/CO2 Detector:: They have two in the house [name]:: Do no have Mailbox:: Is at front door Other Home Environment Concerns:: Daughter reports faulty sockets throughout the house, mirrors in dining room keep falling off walls and some windows are about to fall out. [name] reports gutters are coming away from face board and thus water is running between gutter and face board.  Durable Medical Equipment:    Patient Education:  Pt provided with "how to get up from a fall" and "Home safety checklist"  Goals: Goals Addressed            This Visit's Progress   . Patient Stated       He would like to be able to get on and off toilet(s) easier (2 comfort height toilets and grab bars as able)    . Patient Stated       He would like to be able to pick up items off of the floor more easily. (A reacher would benefit him)    . Patient Stated       He would like to feel more safe getting in front door (right side railing needs secured and perhaps a handle at front door or inside door facing)    . Patient Stated  He would like to know when someone is at their front door (needs a doorbell)       Post Clinical Reasoning: Clinician View Of Client Situation:: [name] is doing great, he still does all of his own yard work, does Leisure centre manager , likes to fish and golf. He has fallen once in the last 6 months where he slipped and fell (he was blowing leaves in his back yard, the yard is rather steep in places, and he leaves were wet). Client View Of His/Her  Situation:: [name] feels he does good for his age with the only thing he mentions is that is overall balance is not as good as it has been. He stays active working in his yard. Next Visit Plan:: Dependent on what modifications are completed first.

## 2021-01-17 NOTE — Patient Instructions (Signed)
Goals Addressed            This Visit's Progress   . Patient Stated       He would like to be able to get on and off toilet(s) easier (2 comfort height toilets and grab bars as able)    . Patient Stated       He would like to be able to pick up items off of the floor more easily. (A reacher would benefit him)    . Patient Stated       He would like to feel more safe getting in front door (right side railing needs secured and perhaps a handle at front door or inside door facing)    . Patient Stated       He would like to know when someone is at their front door (needs a doorbell)

## 2021-01-23 ENCOUNTER — Encounter: Payer: Self-pay | Admitting: Family Medicine

## 2021-01-23 ENCOUNTER — Other Ambulatory Visit: Payer: Self-pay

## 2021-01-23 ENCOUNTER — Ambulatory Visit: Payer: Medicare HMO | Attending: Family Medicine | Admitting: Family Medicine

## 2021-01-23 VITALS — BP 167/84 | Resp 18 | Ht 67.0 in | Wt 181.0 lb

## 2021-01-23 DIAGNOSIS — I10 Essential (primary) hypertension: Secondary | ICD-10-CM | POA: Diagnosis not present

## 2021-01-23 DIAGNOSIS — E1165 Type 2 diabetes mellitus with hyperglycemia: Secondary | ICD-10-CM

## 2021-01-23 DIAGNOSIS — Z0000 Encounter for general adult medical examination without abnormal findings: Secondary | ICD-10-CM | POA: Diagnosis not present

## 2021-01-23 DIAGNOSIS — N1831 Chronic kidney disease, stage 3a: Secondary | ICD-10-CM

## 2021-01-23 DIAGNOSIS — I739 Peripheral vascular disease, unspecified: Secondary | ICD-10-CM | POA: Diagnosis not present

## 2021-01-23 NOTE — Progress Notes (Signed)
Subjective:    [name] is a 75 y.o. male who presents for a annual Medicare preventative health/screening, subsequent visit. [name] states that he is in good health for his age. He is very active.  His current medical problems include PAD, Hypertension, Dyslipidemia, and diabetes. Last A1C >10 x 4 months. He endorses compliance with medications however, doesn't check BP or blood sugar. He has not taken medications prior to today's visit.  He denies any current social needs at present.   Cardiac risk factors: advanced age (older than 58 for men, 30 for women), dyslipidemia, diabetes, hypertension, smoking/ tobacco exposure and current diagnosis of PAD.  Depression Screen (Note: if answer to either of the following is "Yes", a more complete depression screening is indicated)  Q1: Over the past two weeks, have you felt down, depressed or hopeless? no Q2: Over the past two weeks, have you felt little interest or pleasure in doing things? no   Family History  Problem Relation Age of Onset  . CVA Unknown   . Hypertension Unknown   . Diabetes Mother   . Hypertension Father   . Peripheral Artery Disease Father   . Alcohol abuse Father   . Heart attack Father    Past Medical History:  Diagnosis Date  . Angiodysplasia of small intestine ([place]) 02/2016   terminal ileum  . Bleeding gastric ulcer 2000's  . Chronic anemia   . Coronary artery disease   . Diverticulosis   . GERD (gastroesophageal reflux disease)   . GI bleeding   . History of blood transfusion 2000's   "related to bleeding gastric ulcer"  . Hyperlipidemia   . Hypertension   . Non-compliance   . PVD (peripheral vascular disease) ([place])   . S/P coronary artery stent placement 10/03/2016  . Type II diabetes mellitus ([place])      Activities of Daily Living In your present state of health, do you have any difficulty performing the following activities?:  Preparing food and eating?: No Bathing yourself: No Getting  dressed: No Using the toilet:No Moving around from place to place: No In the past year have you fallen or had a near fall?:No  Current exercise habits: Home exercise routine includes gardening, mowing yard, golfing. Dietary issues discussed: yes, no concerns identified  Hearing difficulties: No Safe in current home environment: yes  The following portions of the patient's history were reviewed and updated as appropriate: allergies, current medications, past family history, past medical history, past social history, past surgical history and problem list. Review of Systems Pertinent items are noted in HPI.    Objective:     Blood pressure (!) 167/84, resp. rate 18, height 5\' 7"  (1.702 m), weight 181 lb (82.1 kg). Body mass index is 28.35 kg/m. General appearance: alert, well developed, well nourished, cooperative Head: Normocephalic, without obvious abnormality, atraumatic Respiratory: Respirations even and unlabored, normal respiratory rate Heart: rate and rhythm normal.   Extremities: No gross deformities Skin: Skin color, texture, turgor normal. No rashes seen  Psych: Appropriate mood and affect. Neurologic: GCS 15, normal coordination, normal gait   Assessment:     Hep C screening, will obtain at next follow-up Diabetes, uncontrolled, A1C and foot exam in 6 weeks  Declined shingle vaccine.       Allergies as of 01/23/2021   No Known Allergies     Medication List       Accurate as of January 23, 2021  4:48 PM. If you have any questions, ask your nurse  or doctor.        amLODipine 10 MG tablet Commonly known as: NORVASC Take 1 tablet by mouth daily. What changed: Another medication with the same name was removed. Continue taking this medication, and follow the directions you see here. Changed by: Molli Barrows, FNP   aspirin 81 MG tablet Take 1 tablet (81 mg total) by mouth daily.   bisoprolol 5 MG tablet Commonly known as: ZEBETA Take 1 tablet (5 mg total) by  mouth daily.   dapagliflozin propanediol 5 MG Tabs tablet Commonly known as: Farxiga Take 1 tablet (5 mg total) by mouth daily before breakfast.   ferrous sulfate 325 (65 FE) MG tablet TAKE 1 TABLET BY MOUTH 2 TIMES DAILY WITH A MEAL.   metFORMIN 500 MG tablet Commonly known as: GLUCOPHAGE Take 500 mg by mouth in the morning and at bedtime. What changed: Another medication with the same name was removed. Continue taking this medication, and follow the directions you see here. Changed by: Molli Barrows, FNP   rosuvastatin 40 MG tablet Commonly known as: CRESTOR Take 1 tablet (40 mg total) by mouth at bedtime.       Plan:     During the course of the visit the patient was educated and counseled about appropriate screening and preventive services including:   Nutrition counseling   Gaining control of diabetes, A1C goal <10  Hypertension goal <140/90  Patient Instructions (the written plan) was given to the patient.     Carroll Sage. Kenton Kingfisher, MSN, [facility] and [place], [place], [place] [address] [phone]

## 2021-01-23 NOTE — Patient Instructions (Signed)
      Mr. Gernert , Thank you for taking time to come for your Medicare Wellness Visit. I appreciate your ongoing commitment to your health goals. Please review the following plan we discussed and let me know if I can assist you in the future.   These are the goals we discussed: Reduce A1C (schedule follow-up with PCP) Continue Exercise  Monitor Blood Pressure, goal <140/90 This is a list of the screening recommended for you and due dates:  Health Maintenance  Topic Date Due  . COVID-19 Vaccine (1) Never done  . Pneumococcal Vaccination (1 of 4 - PCV13) Never done  . Hepatitis C Screening: USPSTF Recommendation to screen - Ages 75-79 yo.  Never done  . Zoster (Shingles) Vaccine (1 of 2) Never done  . Eye exam for diabetics  01/20/2019  . Complete foot exam   04/06/2019  . Hemoglobin A1C  03/06/2021  . Flu Shot  03/18/2021  . Urine Protein Check  09/06/2021  . Tetanus Vaccine  07/06/2028  . Pneumonia vaccines  Completed  . HPV Vaccine  Aged Out

## 2021-01-23 NOTE — Progress Notes (Deleted)
Subjective:   [name] is a 75 y.o. male who presents for Medicare Annual/Subsequent preventive examination.  Review of Systems          Objective:    There were no vitals filed for this visit. There is no height or weight on file to calculate BMI.  Advanced Directives 06/26/2017 06/26/2017 01/25/2017 01/25/2017 01/25/2017 02/28/2016 02/27/2016  Does Patient Have a Medical Advance Directive? No No No No No No No  Would patient like information on creating a medical advance directive? No - Patient declined - No - Patient declined - - No - patient declined information No - patient declined information    Current Medications (verified) Outpatient Encounter Medications as of 01/23/2021  Medication Sig  . amLODipine (NORVASC) 5 MG tablet Take 1 tablet (5 mg total) by mouth daily.  Marland Kitchen aspirin 81 MG tablet Take 1 tablet (81 mg total) by mouth daily.  . bisoprolol (ZEBETA) 5 MG tablet Take 1 tablet (5 mg total) by mouth daily.  . dapagliflozin propanediol (FARXIGA) 5 MG TABS tablet Take 1 tablet (5 mg total) by mouth daily before breakfast.  . ferrous sulfate 325 (65 FE) MG tablet TAKE 1 TABLET BY MOUTH 2 TIMES DAILY WITH A MEAL.  . metFORMIN (GLUCOPHAGE) 500 MG tablet Take 1 tablet (500 mg total) by mouth 2 (two) times daily with a meal.  . rosuvastatin (CRESTOR) 40 MG tablet Take 1 tablet (40 mg total) by mouth at bedtime.   No facility-administered encounter medications on file as of 01/23/2021.    Allergies (verified) Patient has no known allergies.   History: Past Medical History:  Diagnosis Date  . Angiodysplasia of small intestine ([place]) 02/2016   terminal ileum  . Bleeding gastric ulcer 2000's  . Chronic anemia   . Coronary artery disease   . Diverticulosis   . GERD (gastroesophageal reflux disease)   . GI bleeding   . History of blood transfusion 2000's   "related to bleeding gastric ulcer"  . Hyperlipidemia   . Hypertension   . Non-compliance   . PVD (peripheral  vascular disease) ([place])   . S/P coronary artery stent placement 10/03/2016  . Type II diabetes mellitus ([place])    Past Surgical History:  Procedure Laterality Date  . CARDIAC CATHETERIZATION N/A 04/24/2015   Procedure: Left Heart Cath and Coronary Angiography;  Surgeon: [name], MD;  Location: [facility];  Service: Cardiovascular;  Laterality: N/A;  . CARDIAC CATHETERIZATION N/A 04/24/2015   Procedure: Coronary Stent Intervention;  Surgeon: [name], MD;  Location: [facility] [facility];  Service: Cardiovascular;  Laterality: N/A;  Prox RCA  . CARDIAC CATHETERIZATION Right 05/29/2015   Procedure: Angioplasty Perioneal and SFA;  Surgeon: [name], MD;  Location: [facility];  Service: Cardiovascular;  Laterality: Right;  . COLONOSCOPY Left 02/29/2016   Procedure: COLONOSCOPY;  Surgeon: Carol Ada, MD;  [facility];  Service: Endoscopy;  Laterality: Left;  . COLONOSCOPY WITH PROPOFOL N/A 11/23/2015   Procedure: COLONOSCOPY WITH PROPOFOL;  Surgeon: Carol Ada, MD;  [facility]: [facility];  Service: Endoscopy;  Laterality: N/A;  . CORONARY ANGIOPLASTY    . ESOPHAGOGASTRODUODENOSCOPY (EGD) WITH PROPOFOL N/A 11/23/2015   Procedure: ESOPHAGOGASTRODUODENOSCOPY (EGD) WITH PROPOFOL;  Surgeon: Carol Ada, MD;  [facility];  Service: Endoscopy;  Laterality: N/A;  . EXCISIONAL HEMORRHOIDECTOMY  1980's  . GIVENS CAPSULE STUDY N/A 02/29/2016   Procedure: GIVENS CAPSULE STUDY;  Surgeon: Carol Ada, MD;  [facility];  Service: Endoscopy;  Laterality: N/A;  . LAPAROSCOPIC SMALL BOWEL RESECTION N/A 06/30/2017   Procedure: LAPAROSCOPIC SMALL BOWEL RESECTION;  Surgeon: Leighton Ruff, MD;  Location: [facility];  Service: General;  Laterality: N/A;  . PERIPHERAL VASCULAR CATHETERIZATION N/A 04/24/2015   Procedure: Lower Extremity Angiography;  Surgeon: [name], MD;  Location: [facility];  Service: Cardiovascular;  Laterality: N/A;  . PERIPHERAL VASCULAR  CATHETERIZATION N/A 05/29/2015   Procedure: Lower Extremity Angiography;  Surgeon: [name], MD;  Location: [facility];  Service: Cardiovascular;  Laterality: N/A;  . PERIPHERAL VASCULAR CATHETERIZATION Left 05/29/2015   Procedure: Renal Angiography;  Surgeon: [name], MD;  Location: [facility] [facility];  Service: Cardiovascular;  Laterality: Left;  . PERIPHERAL VASCULAR CATHETERIZATION Left 06/12/2015   Procedure: Peripheral Vascular Atherectomy;  Surgeon: [name], MD;  Location: [facility];  Service: Cardiovascular;  Laterality: Left;  AT  . PERIPHERAL VASCULAR CATHETERIZATION Left 06/12/2015   Procedure: Peripheral Vascular Intervention;  Surgeon: [name], MD;  Location: [facility];  Service: Cardiovascular;  Laterality: Left;  PTA TP TRUNK  . UPPER GI ENDOSCOPY  12/16/11   findings:  mild gastritis and duodenitis   Family History  Problem Relation Age of Onset  . CVA Unknown   . Hypertension Unknown   . Diabetes Mother   . Hypertension Father   . Peripheral Artery Disease Father   . Alcohol abuse Father   . Heart attack Father    Social History   Socioeconomic History  . Marital status: Married    Spouse name: Not on file  . Number of children: Not on file  . Years of education: Not on file  . Highest education level: Not on file  Occupational History  . Not on file  Tobacco Use  . Smoking status: Former Smoker    Packs/day: 1.00    Years: 54.00    Pack years: 54.00    Types: Cigars, Cigarettes    Quit date: 02/16/2015    Years since quitting: 5.9  . Smokeless tobacco: Former Systems developer    Types: Chew  . Tobacco comment: "chewed some when I was young"  Substance and Sexual Activity  . Alcohol use: No    Alcohol/week: 0.0 standard drinks    Comment: 04/24/2015 "no beer in 2 wks; ;no liquor in 3-4 weeks; I'm not a real heavy drinker" states he stopped drining 2 yrs ago  . Drug use: No  . Sexual activity: Never  Other Topics Concern  . Not on file   Social History Narrative  . Not on file   Social Determinants of Health   Financial Resource Strain: Not on file  Food Insecurity: Not on file  Transportation Needs: Not on file  Physical Activity: Not on file  Stress: Not on file  Social Connections: Not on file    Tobacco Counseling Counseling given: Not Answered Comment: "chewed some when I was young"   Clinical Intake:                 Diabetic?***         Activities of Daily Living No flowsheet data found.  Patient Care Team: [name], MD as PCP - General (Internal Medicine) Pyrtle, Lajuan Lines, MD as Consulting Physician (Gastroenterology) Randon Goldsmith, OT as Occupational Therapist (Occupational Therapy) Randon Goldsmith, OT as Occupational Therapist (Occupational Therapy)  Indicate any recent Medical Services you may have received from other than [facility] providers in the past year (date  may be approximate).     Assessment:   This is a routine wellness examination for Simone.  Hearing/Vision screen No exam data present  Dietary issues and exercise activities discussed:    Goals Addressed   None    Depression Screen PHQ 2/9 Scores 01/03/2021 09/06/2020 04/05/2018 12/01/2017 09/16/2016 02/18/2016 10/31/2015  PHQ - 2 Score 1 0 0 0 0 0 0  PHQ- 9 Score - - - 0 - - -    Fall Risk Fall Risk  01/17/2021 09/06/2020 07/06/2018 12/01/2017 09/16/2016  Falls in the past year? 1 0 1 No No  Number falls in past yr: 0 0 0 - -  Injury with Fall? 1 0 0 - -  Risk for fall due to : History of fall(s);Impaired balance/gait - - - -  Risk for fall due to: Comment he reports his balance is not bad but not what it used to be - - - -  Follow up - - Education provided - -    FALL RISK PREVENTION PERTAINING TO THE HOME:  Any stairs in or around the home? {YES/NO:21197} If so, are there any without handrails? {YES/NO:21197} Home free of loose throw rugs in walkways, pet beds, electrical cords, etc?  {YES/NO:21197} Adequate lighting in your home to reduce risk of falls? {YES/NO:21197}  ASSISTIVE DEVICES UTILIZED TO PREVENT FALLS:  Life alert? {YES/NO:21197} Use of a cane, walker or w/c? {YES/NO:21197} Grab bars in the bathroom? {YES/NO:21197} Shower chair or bench in shower? {YES/NO:21197} Elevated toilet seat or a handicapped toilet? {YES/NO:21197}  TIMED UP AND GO:  Was the test performed? {YES/NO:21197}.  Length of time to ambulate 10 feet: *** sec.   {Appearance of BFXO:3291916}  Cognitive Function:        Immunizations Immunization History  Administered Date(s) Administered  . Influenza,inj,Quad PF,6+ Mos 09/16/2016, 09/01/2017, 04/05/2018, 09/06/2020  . Pneumococcal Conjugate-13 09/01/2017  . Pneumococcal Polysaccharide-23 10/17/2014  . Tdap 07/06/2018    {TDAP status:2101805}  {Flu Vaccine status:2101806}  {Pneumococcal vaccine status:2101807}  {Covid-19 vaccine status:2101808}  Qualifies for Shingles Vaccine? {YES/NO:21197}  Zostavax completed {YES/NO:21197}  {Shingrix Completed?:2101804}  Screening Tests Health Maintenance  Topic Date Due  . COVID-19 Vaccine (1) Never done  . Pneumococcal Vaccine 19-69 Years old (1 of 4 - PCV13) Never done  . Hepatitis C Screening  Never done  . Zoster Vaccines- Shingrix (1 of 2) Never done  . OPHTHALMOLOGY EXAM  01/20/2019  . FOOT EXAM  04/06/2019  . HEMOGLOBIN A1C  03/06/2021  . INFLUENZA VACCINE  03/18/2021  . URINE MICROALBUMIN  09/06/2021  . TETANUS/TDAP  07/06/2028  . PNA vac Low Risk Adult  Completed  . HPV VACCINES  Aged Out    Health Maintenance  Health Maintenance Due  Topic Date Due  . COVID-19 Vaccine (1) Never done  . Pneumococcal Vaccine 29-63 Years old (1 of 4 - PCV13) Never done  . Hepatitis C Screening  Never done  . Zoster Vaccines- Shingrix (1 of 2) Never done  . OPHTHALMOLOGY EXAM  01/20/2019  . FOOT EXAM  04/06/2019    {Colorectal cancer screening:2101809}  Lung Cancer  Screening: (Low Dose CT Chest recommended if Age 102-80 years, 30 pack-year currently smoking OR have quit w/in 15years.) {DOES NOT does:27190::"does not"} qualify.   Lung Cancer Screening Referral: ***  Additional Screening:  Hepatitis C Screening: {DOES NOT does:27190::"does not"} qualify; Completed ***  Vision Screening: Recommended annual ophthalmology exams for early detection of glaucoma and other disorders of the eye. Is the patient up  to date with their annual eye exam?  {YES/NO:21197} Who is the provider or what is the name of the office in which the patient attends annual eye exams? *** If pt is not established with a provider, would they like to be referred to a provider to establish care? {YES/NO:21197}.   Dental Screening: Recommended annual dental exams for proper oral hygiene  Community Resource Referral / Chronic Care Management: CRR required this visit?  {YES/NO:21197}  CCM required this visit?  {YES/NO:21197}     Plan:     I have personally reviewed and noted the following in the patient's chart:   . Medical and social history . Use of alcohol, tobacco or illicit drugs  . Current medications and supplements including opioid prescriptions. {Opioid [phone]} . Functional ability and status . Nutritional status . Physical activity . Advanced directives . List of other physicians . Hospitalizations, surgeries, and ER visits in previous 12 months . Vitals . Screenings to include cognitive, depression, and falls . Referrals and appointments  In addition, I have reviewed and discussed with patient certain preventive protocols, quality metrics, and best practice recommendations. A written personalized care plan for preventive services as well as general preventive health recommendations were provided to patient.     Molli Barrows, FNP   01/23/2021   Nurse Notes: ***

## 2021-01-30 ENCOUNTER — Other Ambulatory Visit: Payer: Self-pay

## 2021-01-30 NOTE — Patient Outreach (Signed)
Aging Gracefully Program  01/30/2021  Harkirat Orozco 10/23/1945 [id number]   Placed call to patient who answered. Explained reason for call. Offered home visit for patient and  his wife on 02/01/2021 at 32. Appointment accepted. Confirmed address.  Tomasa Rand, RN, BSN, CEN [facility] [employer] [phone]

## 2021-02-01 ENCOUNTER — Other Ambulatory Visit: Payer: Self-pay

## 2021-02-01 ENCOUNTER — Encounter: Payer: Self-pay | Admitting: Internal Medicine

## 2021-02-01 NOTE — Patient Outreach (Signed)
Aging Gracefully Program  RN Visit  02/01/2021  [name] 06-Mar-1946 [id number]  Visit:   RN initial home visit   Start Time:   1030 End Time:   1230 Total Minutes:    120 Readiness To Change Score:     Universal RN Interventions: Calendar Distribution: Yes Exercise Review: No Medications: Yes Medication Changes: Yes Mood: Yes Pain: Yes PCP Advocacy/Support: No Fall Prevention: Yes Incontinence: Yes Clinician View Of Client Situation: Patient is not taking medications as prescribed. Reviewed amounts of medications in pill bottles.  Reviewed medication list and non compliance. Patient states to me that he does not believe his A1c reading.  Patient has pending DM medications which he has not picked up or checked on.  Reports he is not self monitoring BP or CBG.  Reports he eats what he wants.  Home in need of repairs. House is hot 76 degrees. Thermostat set at [employer] Of His/Her Situation: Patient reports to me his chief concern is his roof and clogged gutters.  Patient uses one bathroom and wife uses another.  Each sleep in separate rooms and eat separate meals.  Patient reports to me that he likes to work outside.  Healthcare Provider Communication: Did Higher education careers adviser With [employer] Provider?: No According to Client, Did PCP Report Communication With An Aging Gracefully RN?: No  Clinician View of Client Situation: Clinician View Of Client Situation: Patient is not taking medications as prescribed. Reviewed amounts of medications in pill bottles.  Reviewed medication list and non compliance. Patient states to me that he does not believe his A1c reading.  Patient has pending DM medications which he has not picked up or checked on.  Reports he is not self monitoring BP or CBG.  Reports he eats what he wants.  Home in need of repairs. House is hot 76 degrees. Thermostat set at [employer] of His/Her Situation: Client View Of His/Her Situation: Patient reports to  me his chief concern is his roof and clogged gutters.  Patient uses one bathroom and wife uses another.  Each sleep in separate rooms and eat separate meals.  Patient reports to me that he likes to work outside.  Medication Assessment: Do You Have Any Problems Paying For Medications?: Yes (reports difficulty paying for medications but has not called the drug store to find out about the cost.   Daughter will call pharmacy and then determine if patient is able to afford. If patient is not able toafford then daughter will call MD office.) Where Does Client Store Medications?: Other: (bedroom) Can Client Read Pill Bottles?: Yes Does Client Use A Pillbox?: No Does Anyone Assist Client In Taking Medications?: No Do You Take Vitamin D?: No Total Number Of Medications That The Client Takes: 5 Does Client Have Any Questions Or Concerns About Medictions?: No Is Client Complaining Of Any Symptoms That Could Be Side Effects To Medications?: No Any Possible Changes In Medication Regimen?: Yes  Outpatient Encounter Medications as of 02/01/2021  Medication Sig Note   metFORMIN (GLUCOPHAGE) 500 MG tablet Take 500 mg by mouth in the morning and at bedtime. 02/01/2021: Patient has 2 pill bottles at home. One refilled 1/22 with many tablets left in bottle. New bottle from 4/22 with none taken out of this bottle.  Reviewed with patient about compliance.    rosuvastatin (CRESTOR) 40 MG tablet Take 1 tablet (40 mg total) by mouth at bedtime. 02/01/2021: Patient reports he is taking this medication but 3 bottles noted in  the home.  2 unopened.   amLODipine (NORVASC) 10 MG tablet Take 1 tablet by mouth daily. 02/01/2021: Pill bottles are 5mg  q day.   aspirin 81 MG tablet Take 1 tablet (81 mg total) by mouth daily. (Patient not taking: Reported on 02/01/2021) 02/01/2021: Reports he takes occasionally   bisoprolol (ZEBETA) 5 MG tablet Take 1 tablet (5 mg total) by mouth daily.    dapagliflozin propanediol (FARXIGA) 5 MG TABS  tablet Take 1 tablet (5 mg total) by mouth daily before breakfast. (Patient not taking: Reported on 02/01/2021) 02/01/2021: Has not picked up from drug store   ferrous sulfate 325 (65 FE) MG tablet TAKE 1 TABLET BY MOUTH 2 TIMES DAILY WITH A MEAL. (Patient not taking: Reported on 02/01/2021)    No facility-administered encounter medications on file as of 02/01/2021.     OT Update: pending community housing solutions assessment  Session Summary: DM not controlled with A1c of 11.3.  Not taking medications as prescribed. BP elevated at last office visit.  Not self monitoring BP.  Goals: will work on DM and BP control.   Wife and daughter present for home visit and assessment.   Goals Addressed               This Visit's Progress     Patient Stated (pt-stated)                                                                                                                                                                         Aging Gracefully RN:  Patient states he will self monitor CBG twice a week fasting (* Tuesday and Saturdays) for the next 120 days.  Assessment:  Patient is not currently self monitoring CBG. Reports he has a meter but is not able to verbalize type. A1c of 11.3. Denies follow DM diet.  Interventions: reviewed in great detail about the importance of self monitoring. Reviewed normal/ goal for A1c. Reviewed diet and need for control. Reviewed with patient how to take medications and the importance of compliance with medications. Reviewed with patient, wife and daughter to try to locate CBG machine. If unable to locate machine to please call MD and get order sent to the pharmacy.   Patient verbalizes knowing how to self monitor. Encouraged patient to record CBG twice a week in [facility] calendar.                                                  Encouraged patient to allow daughter to fill pill box so he can take his medications correctly.  PLAN: will follow up mid July. Daughter to call me with an appointment date and time.  Tomasa Rand, RN, BSN, CEN [place] Coordinator [phone]         Patient Stated (pt-stated)        Aging Gracefully RN  Patient will self monitor BP weekly ( Saturday) for the next 120 days.  Assessment: patient is currently not self monitoring BP as recommended by MD. Last office visit patient with elevated BP.  Unsure if patient is taking his medications as prescribed.  Interventions: patient reports that he has a BP monitor but will need to look for it. Encouraged patient to self monitor BP. Reviewed when to call MD. Explained importance of monitoring BP. Encouraged patient to record BP on [facility] calendar. Encouraged patient to take  medications as prescribed. Encouraged patient to allow daughter to assist with pill box filling.   Plan: follow up mid July.  Tomasa Rand, RN, BSN, CEN [place] Coordinator [phone]           Tomasa Rand, RN, BSN, CEN [facility] [employer] [phone]

## 2021-02-04 ENCOUNTER — Other Ambulatory Visit: Payer: Self-pay

## 2021-02-04 MED ORDER — MISC. DEVICES MISC: 0 refills | Status: DC

## 2021-02-04 MED ORDER — ONETOUCH ULTRASOFT LANCETS MISC: 12 refills | Status: DC

## 2021-02-04 MED ORDER — ONETOUCH VERIO W/DEVICE KIT: PACK | Status: DC

## 2021-02-04 MED ORDER — GLUCOSE BLOOD VI STRP: ORAL_STRIP | 12 refills | Status: DC

## 2021-02-05 ENCOUNTER — Other Ambulatory Visit: Payer: Self-pay | Admitting: Pharmacist

## 2021-02-05 MED ORDER — ONETOUCH VERIO W/DEVICE KIT: PACK | Status: DC

## 2021-02-05 MED ORDER — ONETOUCH ULTRASOFT LANCETS MISC: 12 refills | Status: DC

## 2021-02-05 MED ORDER — GLUCOSE BLOOD VI STRP: ORAL_STRIP | 12 refills | Status: DC

## 2021-02-06 ENCOUNTER — Other Ambulatory Visit: Payer: Self-pay

## 2021-02-06 DIAGNOSIS — E1165 Type 2 diabetes mellitus with hyperglycemia: Secondary | ICD-10-CM

## 2021-02-06 DIAGNOSIS — I10 Essential (primary) hypertension: Secondary | ICD-10-CM

## 2021-02-06 MED ORDER — ONETOUCH VERIO W/DEVICE KIT: PACK | Status: AC

## 2021-02-06 MED ORDER — ONETOUCH VERIO W/DEVICE KIT: PACK | Status: DC

## 2021-02-06 MED ORDER — MISC. DEVICES MISC: 0 refills | Status: AC

## 2021-02-06 MED ORDER — ONETOUCH ULTRASOFT LANCETS MISC: 12 refills | Status: AC

## 2021-02-06 MED ORDER — GLUCOSE BLOOD VI STRP: ORAL_STRIP | 12 refills | Status: AC

## 2021-02-28 ENCOUNTER — Other Ambulatory Visit: Payer: Self-pay

## 2021-02-28 NOTE — Patient Outreach (Signed)
Aging Gracefully Program  RN Visit  02/28/2021  [name] 03-27-1946 [id number]  Visit:   RN home visit #2  Start Time:   1200 End Time:   1315 Total Minutes:   75  Readiness To Change Score:     Universal RN Interventions: Calendar Distribution: Yes Exercise Review: Yes Medications: Yes Medication Changes: No Mood: Yes Pain: Yes PCP Advocacy/Support: Yes Fall Prevention: Yes Incontinence: Yes Clinician View Of Client Situation: Patient with difficuty self monitoring CBG. reports machine is confusing. Observed patient trying to check CBG during home visit and patient is struggling.  Provide verbal ques about next steps. Encouraged patient to continue to try to learn.  CBG today 240.  Home neat and clean. Patient walking without ddifficulty.  Appears frustrated when I was reviewing his lack of taking medications. Pill box remains full from when daughter filled weeks ago. Patient states he is taking his medications from the bottles.. Client View Of His/Her Situation: Reports difficulty in self monitroing BP and CBG.  Healthcare Provider Communication: Did Higher education careers adviser With [employer] Provider?: No  Clinician View of Client Situation: Clinician View Of Client Situation: Patient with difficuty self monitoring CBG. reports machine is confusing. Observed patient trying to check CBG during home visit and patient is struggling.  Provide verbal ques about next steps. Encouraged patient to continue to try to learn.  CBG today 240.  Home neat and clean. Patient walking without ddifficulty.  Appears frustrated when I was reviewing his lack of taking medications. Pill box remains full from when daughter filled weeks ago. Patient states he is taking his medications from the bottles.. Client's View of His/Her Situation: Client View Of His/Her Situation: Reports difficulty in self monitroing BP and CBG.  Medication Assessment: reviewed non use of pill box. Reviewed importance again.  Provided new pill box.  Reviewed reasons for each medications and its importance.     OT Update: pending [employer]  Session Summary: Patient continues to be non compliant with taking medications and self monitoring. Provided written home exercise plan, demonstrated exercises, reviewed importance. Encouraged daily completion of home exercise plan.    Goals       Patient Stated      He would like to be able to get on and off toilet(s) easier (2 comfort height toilets and grab bars as able)      Patient Stated      He would like to be able to pick up items off of the floor more easily. (A reacher would benefit him)      Patient Stated      He would like to feel more safe getting in front door (right side railing needs secured and perhaps a handle at front door or inside door facing)      Patient Stated      He would like to know when someone is at their front door (needs a doorbell)      Patient Stated (pt-stated)  Aging Gracefully RN:  Patient states he will self monitor CBG twice a week fasting (* Tuesday and Saturdays) for the next 120 days.  Assessment:  Patient is not currently self monitoring CBG. Reports he has a meter but is not able to verbalize type. A1c of 11.3. Denies follow DM diet.  Interventions: reviewed in great detail about the importance of self monitoring. Reviewed normal/ goal for A1c. Reviewed diet and need for control. Reviewed with patient how to take medications and the importance of compliance with medications. Reviewed with patient, wife and daughter to try to locate CBG machine. If unable to locate machine to please call MD and get order sent to the pharmacy.   Patient verbalizes knowing how to self monitor. Encouraged patient to record CBG twice a week in [facility] calendar.                                                   Encouraged patient to allow daughter to fill pill box so he can take his medications correctly.  PLAN: will follow up mid July. Daughter to call me with an appointment date and time.  Tomasa Rand, RN, BSN, CEN [place] Coordinator [phone]   02/28/2021 Assessment: Patient is not self monitoring.  Daughter checks CBG on Sundays.  Intervention:  reviewed importance of of self monitoring.  Reviewed with patient how to use meter and lancet device. Patient able to return demonstration.   Provided a new pill box and daughter will fill again. Recommended medications be taken home with daughter so patient will use pill box to ensure he is  taking his medication.   Encouraged patient to record CBG readings in [facility] calendar. Placed marker on tab to help patient remember.   PLAN: next home visit planned for 03/28/2021, will reassess compliance with medications and BG monitoring.   Tomasa Rand, RN, BSN, CEN Aging Gracefully RN [phone]        Patient Stated (pt-stated)      Aging Gracefully RN  Patient will self monitor BP weekly ( Saturday) for the next 120 days.  Assessment: patient is currently not self monitoring BP as recommended by MD. Last office visit patient with elevated BP.  Unsure if patient is taking his medications as prescribed.  Interventions: patient reports that he has a BP monitor but will need to look for it. Encouraged patient to self monitor BP. Reviewed when to call MD. Explained importance of monitoring BP. Encouraged patient to record BP on [facility] calendar. Encouraged patient to take medications as prescribed. Encouraged patient to allow daughter to assist with pill box filling.   Plan: follow up mid July.  Tomasa Rand, RN, BSN, CEN [place] Coordinator [phone]          Tomasa Rand, RN, BSN, [employer] Aging Gracefully RN [phone]

## 2021-02-28 NOTE — Patient Instructions (Signed)
Visit Information   Goals Addressed               This Visit's Progress     Patient Stated (pt-stated)                                                                                                                                                                         Aging Gracefully RN:  Patient states he will self monitor CBG twice a week fasting (* Tuesday and Saturdays) for the next 120 days.  Assessment:  Patient is not currently self monitoring CBG. Reports he has a meter but is not able to verbalize type. A1c of 11.3. Denies follow DM diet.  Interventions: reviewed in great detail about the importance of self monitoring. Reviewed normal/ goal for A1c. Reviewed diet and need for control. Reviewed with patient how to take medications and the importance of compliance with medications. Reviewed with patient, wife and daughter to try to locate CBG machine. If unable to locate machine to please call MD and get order sent to the pharmacy.   Patient verbalizes knowing how to self monitor. Encouraged patient to record CBG twice a week in [facility] calendar.                                                  Encouraged patient to allow daughter to fill pill box so he can take his medications correctly.  PLAN: will follow up mid July. Daughter to call me with an appointment date and time.  Tomasa Rand, RN, BSN, CEN [place] Coordinator [phone]   02/28/2021 Assessment: Patient is not self monitoring.  Daughter checks CBG on Sundays.  Intervention:  reviewed importance of of self monitoring.  Reviewed with patient how to use meter and lancet device. Patient able to return demonstration.   Provided a new pill box and daughter will fill again. Recommended medications be taken home with daughter so patient will use pill box to ensure he is taking his medication.   Encouraged patient to record CBG readings in [facility] calendar. Placed marker on tab to help patient remember.   PLAN: next home visit planned for 03/28/2021, will reassess compliance with medications and BG monitoring.   Tomasa Rand, RN, BSN, [employer] [phone]

## 2021-03-12 ENCOUNTER — Other Ambulatory Visit: Payer: Self-pay | Admitting: Internal Medicine

## 2021-03-12 DIAGNOSIS — I10 Essential (primary) hypertension: Secondary | ICD-10-CM

## 2021-03-12 DIAGNOSIS — E118 Type 2 diabetes mellitus with unspecified complications: Secondary | ICD-10-CM

## 2021-03-12 NOTE — Telephone Encounter (Signed)
Requested medications are due for refill today.  Unknown  Requested medications are on the active medications list.  yes  Last refill. unknown Future visit scheduled.   no  Notes to clinic.  Medication prescribed by historical provider.

## 2021-03-12 NOTE — Telephone Encounter (Signed)
Patient was instructed to return for labs when he was seen in January after being on metformin for one month. He did not do so and has not had labs since. Will defer refills to Dr. Wynetta Emery.

## 2021-03-12 NOTE — Telephone Encounter (Signed)
Requested Prescriptions  Pending Prescriptions Disp Refills  . metFORMIN (GLUCOPHAGE) 500 MG tablet [Pharmacy Med Name: METFORMIN HCL 500 MG TABLET] 180 tablet 2    Sig: TAKE 1 TABLET BY MOUTH 2 TIMES DAILY WITH A MEAL.     Endocrinology:  Diabetes - Biguanides Failed - 03/12/2021  1:39 AM      Failed - Cr in normal range and within 360 days    Creat  Date Value Ref Range Status  10/31/2015 1.35 (H) 0.70 - 1.18 mg/dL Final   Creatinine, Ser  Date Value Ref Range Status  09/06/2020 1.60 (H) 0.76 - 1.27 mg/dL Final         Failed - HBA1C is between 0 and 7.9 and within 180 days    HbA1c, POC (controlled diabetic range)  Date Value Ref Range Status  09/06/2020 11.3 (A) 0.0 - 7.0 % Final         Failed - AA eGFR in normal range and within 360 days    GFR calc Af Amer  Date Value Ref Range Status  09/06/2020 48 (L) >59 mL/min/1.73 Final    Comment:    **In accordance with recommendations from the NKF-ASN Task force,**   [facility] is in the process of updating its eGFR calculation to the   2021 CKD-EPI creatinine equation that estimates kidney function   without a race variable.    GFR calc non Af Amer  Date Value Ref Range Status  09/06/2020 42 (L) >59 mL/min/1.73 Final         Failed - Valid encounter within last 6 months    Recent Outpatient Visits          1 month ago Medicare annual wellness visit, subsequent   [place], Carroll Sage, FNP   6 months ago Diabetes mellitus type 2, uncontrolled, with complications [facility])   [place] Karle Plumber B, MD   2 years ago Diabetes mellitus type 2, uncontrolled, with complications [facility])   [place], MD   2 years ago Diabetes mellitus type 2, uncontrolled, with complications [facility])   [place], Stephen L, RPH-CPP   2 years ago Diabetes mellitus type 2, uncontrolled,  with complications [facility])   [place], MD      Future Appointments            In 3 weeks [name], MD [place]           . bisoprolol (ZEBETA) 5 MG tablet [Pharmacy Med Name: BISOPROLOL FUMARATE 5 MG TAB] 90 tablet 0    Sig: TAKE 1 TABLET (5 MG TOTAL) BY MOUTH DAILY.     Cardiovascular:  Beta Blockers Failed - 03/12/2021  1:39 AM      Failed - Last BP in normal range    BP Readings from Last 1 Encounters:  01/23/21 (!) 167/84         Failed - Valid encounter within last 6 months    Recent Outpatient Visits          1 month ago Medicare annual wellness visit, subsequent   [place], Carroll Sage, FNP   6 months ago Diabetes mellitus type 2, uncontrolled, with complications [facility])   [place] [facility] [name], MD   2 years  ago Diabetes mellitus type 2, uncontrolled, with complications [facility])   [place], Deborah B, MD   2 years ago Diabetes mellitus type 2, uncontrolled, with complications [facility])   [place], Jarome Matin, RPH-CPP   2 years ago Diabetes mellitus type 2, uncontrolled, with complications [facility])   [place], MD      Future Appointments            In 3 weeks [name], MD [place] - Last Heart Rate in normal range    Pulse Readings from Last 1 Encounters:  09/11/20 74

## 2021-03-13 ENCOUNTER — Telehealth: Payer: Self-pay | Admitting: Internal Medicine

## 2021-03-13 NOTE — Telephone Encounter (Signed)
Phone call placed to patient today.  Both the patient and his daughter were on the line.  2 patient identification used.   I received his request for refill on metformin.  Informed patient that I would like for him to return to the lab to have his kidney function check.  When I last saw him in January, we did blood test and it showed slight worsening of his kidney function.  I had sent him the results of his test informing him of his kidney function.  I recommended that he return to the lab after being on metformin for 1 month to make sure that his kidney function has remained stable.  Patient states he was not aware that he had a problem with his kidney.  I informed him that I will do a refill on the metformin but would like for him to return to the lab to have the blood test done.  Patient is agreeable to doing so.

## 2021-03-14 ENCOUNTER — Other Ambulatory Visit: Payer: Self-pay

## 2021-03-14 ENCOUNTER — Ambulatory Visit: Payer: Medicare HMO | Attending: Internal Medicine

## 2021-03-14 DIAGNOSIS — N1831 Chronic kidney disease, stage 3a: Secondary | ICD-10-CM

## 2021-03-15 LAB — BASIC METABOLIC PANEL
BUN/Creatinine Ratio: 12 (ref 10–24)
BUN: 15 mg/dL (ref 8–27)
CO2: 24 mmol/L (ref 20–29)
Calcium: 9.7 mg/dL (ref 8.6–10.2)
Chloride: 98 mmol/L (ref 96–106)
Creatinine, Ser: 1.29 mg/dL — ABNORMAL HIGH (ref 0.76–1.27)
Glucose: 253 mg/dL — ABNORMAL HIGH (ref 65–99)
Potassium: 4.3 mmol/L (ref 3.5–5.2)
Sodium: 135 mmol/L (ref 134–144)
eGFR: 57 mL/min/{1.73_m2} — ABNORMAL LOW (ref >59)

## 2021-03-26 ENCOUNTER — Ambulatory Visit: Payer: Medicare HMO | Admitting: Internal Medicine

## 2021-04-03 ENCOUNTER — Other Ambulatory Visit: Payer: Self-pay

## 2021-04-03 NOTE — Patient Outreach (Signed)
Aging Gracefully Program  RN Visit  04/03/2021  [name] [birth year] [id number]  Visit:   RN home visit #3  Start Time:   4pm End Time:   5pm Total Minutes:   60  Readiness To Change Score:     Universal RN Interventions: Calendar Distribution: Yes Exercise Review: Yes Medications: Yes Medication Changes: Yes Mood: Yes Pain: Yes PCP Advocacy/Support: Yes Fall Prevention: Yes Incontinence: Yes Clinician View Of Client Situation: Home neat and clean. Wife and daughter [name] present for home visit.  Patient appears frustrated with inablility to use CBG meter. Appears frustrated when I asked him to demonsrate his struggles with his CBG meter.   Patient not interested in listening to suggestions about trouble shootting. Upon review of meter noted that patient has not monitored his CBG in1 month.  Patient reports that he did not know his has kidney problems. Again reviewed with patient the affect of uncontrolled DM and kidney disease. Client View Of His/Her Situation: patient reports I am fine. I can not work that [employer].  Reports she went to MD office to have labs drawn.  Reports taking his medications as prescribed.  Patient reports to me that he has not done his exercises and does not want to do them. States his walking will do him more good.  Healthcare Provider Communication: none. Reviewed pending office visit appointment.    Clinician View of Client Situation: Clinician View Of Client Situation: Home neat and clean. Wife and daughter [name] present for home visit.  Patient appears frustrated with inablility to use CBG meter. Appears frustrated when I asked him to demonsrate his struggles with his CBG meter.   Patient not interested in listening to suggestions about trouble shootting. Upon review of meter noted that patient has not monitored his CBG in1 month.  Patient reports that he did not know his has kidney problems. Again reviewed with patient the affect of uncontrolled DM and  kidney disease. Client's View of His/Her Situation: Client View Of His/Her Situation: patient reports I am fine. I can not work that [employer].  Reports she went to MD office to have labs drawn.  Reports taking his medications as prescribed.  Patient reports to me that he has not done his exercises and does not want to do them. States his walking will do him more good.  Medication Assessment: reports taking medications as prescribed.     OT Update: pending home modifications. Contracts signed.  Session Summary: Patient appears frustrated with everything. ( His meter and his wife)  Reviewed affects of stress on DM. Encouraged patient and wife to work on Engineer, materials.  Next home visit planned for 05/15/2021 at 4pm

## 2021-04-05 ENCOUNTER — Ambulatory Visit: Payer: Medicare HMO | Attending: Internal Medicine | Admitting: Internal Medicine

## 2021-04-05 ENCOUNTER — Other Ambulatory Visit: Payer: Self-pay

## 2021-04-05 ENCOUNTER — Encounter: Payer: Self-pay | Admitting: Internal Medicine

## 2021-04-05 VITALS — BP 130/80 | HR 71 | Resp 16 | Wt 181.8 lb

## 2021-04-05 DIAGNOSIS — I152 Hypertension secondary to endocrine disorders: Secondary | ICD-10-CM | POA: Diagnosis not present

## 2021-04-05 DIAGNOSIS — N1831 Chronic kidney disease, stage 3a: Secondary | ICD-10-CM | POA: Diagnosis not present

## 2021-04-05 DIAGNOSIS — I2510 Atherosclerotic heart disease of native coronary artery without angina pectoris: Secondary | ICD-10-CM | POA: Diagnosis not present

## 2021-04-05 DIAGNOSIS — E1159 Type 2 diabetes mellitus with other circulatory complications: Secondary | ICD-10-CM | POA: Diagnosis not present

## 2021-04-05 DIAGNOSIS — E1151 Type 2 diabetes mellitus with diabetic peripheral angiopathy without gangrene: Secondary | ICD-10-CM | POA: Diagnosis not present

## 2021-04-05 LAB — GLUCOSE, POCT (MANUAL RESULT ENTRY): POC Glucose: 204 mg/dl — AB (ref 70–99)

## 2021-04-05 LAB — POCT GLYCOSYLATED HEMOGLOBIN (HGB A1C): HbA1c, POC (controlled diabetic range): 9.2 % — AB (ref 0.0–7.0)

## 2021-04-05 MED ORDER — DAPAGLIFLOZIN PROPANEDIOL 10 MG PO TABS: 10.0000 mg | ORAL_TABLET | Freq: Every day | ORAL | 6 refills | Status: AC

## 2021-04-05 NOTE — Progress Notes (Signed)
Patient ID: [name], male    DOB: [birth year]  MRN: [id number]  CC: Diabetes and Hypertension   Subjective: [name] is a 75 y.o. male who presents for chronic ds management His concerns today include:  Pt with hx of  small bowel bleeding with resection of benign neuroendocrine tumor  06/2017, CAD with hx of PCI, PVD, CKD, HTN, HL, DM type 2  Did not bring meds with him today  DM: Results for orders placed or performed in visit on 04/05/21  POCT glucose (manual entry)  Result Value Ref Range   POC Glucose 204 (A) 70 - 99 mg/dl  POCT glycosylated hemoglobin (Hb A1C)  Result Value Ref Range   Hemoglobin A1C     HbA1c POC (<> result, manual entry)     HbA1c, POC (prediabetic range)     HbA1c, POC (controlled diabetic range) 9.2 (A) 0.0 - 7.0 %  -reports compliance with Metformin 500 mg BID and Farxiga. -checks BS a few times a wk. Has a new glucometer. Home health nurse sent by his insurance went over with him and his daughter how to use it.   -doing well with eating habits.  Drinks a little [employer] occasionally. Walks daily with his dog. Referred for eye exam on last visit.  Patient states he was not called with an appointment.  The referral message said that patient does not need to see a retinal specialist.  So I will have to resubmit this referral.  HYPERTENSION/CAD/PAD Currently taking: see medication list.  He reports compliance with taking amlodipine, Zebeta, Crestor, aspirin Med Adherence: [x] Yes -on Norvasc and Zebeta but did not take as yet for the morning.  Reports compliance with Crestor and ASA He saw his cardiologist Dr. Fletcher Anon in January of this year shortly after his last appointment with me.  He stated that patient has progression of severe proximal left SFA stenosis but patient is asymptomatic.  He recommended continued aggressive medical therapy Medication side effects: [] Yes    [x] No Adherence with salt restriction: [x] Yes    [] No Home  Monitoring?: [x] Yes    [] No Monitoring Frequency: 134-138/? Home BP results range:  SOB? [] Yes    [x] No Chest Pain?: [] Yes    [x] No Leg swelling?: [] Yes    [x] No Headaches?: [] Yes    [x] No Dizziness? [] Yes    [x] No Comments: no pain in legs with walking.  No rest pain  CKD 3a: GFR and creatinine improved when last checked 02/2021.  Previous creatinine was 1.6, now 1.29.  Previous GFR was 48, now 57.  GFR improved on Farxiga  Hx of IDA.  Not on iron for a while.  Causes constipation.  CBC nl on blood test 08/2020  Patient Active Problem List   Diagnosis Date Noted   Influenza vaccine needed 09/06/2020   History of medication noncompliance 09/06/2020   Stage 3a chronic kidney disease ([place]) 09/06/2020   Benign neuroendocrine tumor of small intestine 09/02/2017   Hypertensive cardiovascular disease 06/25/2017   PVC's (premature ventricular contractions) 01/26/2017   CAD S/P percutaneous coronary angioplasty 10/03/2016   Chest pain with high risk for cardiac etiology 04/22/2015   Claudication in peripheral vascular disease ([place]) 04/22/2015   Anemia, iron deficiency 10/17/2014   Essential hypertension 10/17/2014   DM2 (diabetes mellitus, type 2) ([place]) 04/08/2012   PUD (peptic ulcer disease) 11/11/2011     Current Outpatient Medications on File  Prior to Visit  Medication Sig Dispense Refill   amLODipine (NORVASC) 10 MG tablet Take 1 tablet by mouth daily.     aspirin 81 MG tablet Take 1 tablet (81 mg total) by mouth daily. 30 tablet 11   bisoprolol (ZEBETA) 5 MG tablet TAKE 1 TABLET (5 MG TOTAL) BY MOUTH DAILY. 90 tablet 0   Blood Glucose Monitoring Suppl (ONETOUCH VERIO) w/Device KIT USE 3 TIMES DAILY DxE11.8 1 kit EACH   glucose blood test strip Use as instructed 100 each 12   Lancets (ONETOUCH ULTRASOFT) lancets Use as instructed 100 each 12   metFORMIN (GLUCOPHAGE) 500 MG tablet TAKE 1 TABLET BY MOUTH 2 TIMES DAILY WITH A MEAL. 180 tablet 0   Misc. Devices MISC Blood  pressure monitor. 1 each 0   rosuvastatin (CRESTOR) 40 MG tablet Take 1 tablet (40 mg total) by mouth at bedtime. 90 tablet 3   No current facility-administered medications on file prior to visit.    No Known Allergies  Social History   Socioeconomic History   Marital status: Married    Spouse name: Not on file   Number of children: 2   Years of education: 12   Highest education level: High school graduate  Occupational History   Not on file  Tobacco Use   Smoking status: Former    Packs/day: 1.00    Years: 54.00    Pack years: 54.00    Types: Cigars, Cigarettes    Quit date: 02/16/2015    Years since quitting: 6.1   Smokeless tobacco: Former    Types: Chew   Tobacco comments:    "chewed some when I was young"  Substance and Sexual Activity   Alcohol use: No    Alcohol/week: 0.0 standard drinks    Comment: 04/24/2015 "no beer in 2 wks; ;no liquor in 3-4 weeks; I'm not a real heavy drinker" states he stopped drining 2 yrs ago   Drug use: No   Sexual activity: Never  Other Topics Concern   Not on file  Social History Narrative   Not on file   Social Determinants of Health   Financial Resource Strain: Not on file  Food Insecurity: Not on file  Transportation Needs: Not on file  Physical Activity: Not on file  Stress: Not on file  Social Connections: Not on file  Intimate Partner Violence: Not on file    Family History  Problem Relation Age of Onset   CVA Unknown    Hypertension Unknown    Diabetes Mother    Hypertension Father    Peripheral Artery Disease Father    Alcohol abuse Father    Heart attack Father     Past Surgical History:  Procedure Laterality Date   CARDIAC CATHETERIZATION N/A 04/24/2015   Procedure: Left Heart Cath and Coronary Angiography;  Surgeon: [name], MD;  Location: [place];  Service: Cardiovascular;  Laterality: N/A;   CARDIAC CATHETERIZATION N/A 04/24/2015   Procedure: Coronary Stent Intervention;  Surgeon: [name], MD;   Location: [place] [facility];  Service: Cardiovascular;  Laterality: N/A;  Prox RCA   CARDIAC CATHETERIZATION Right 05/29/2015   Procedure: Angioplasty Perioneal and SFA;  Surgeon: [name], MD;  Location: [place] [facility];  Service: Cardiovascular;  Laterality: Right;   COLONOSCOPY Left 02/29/2016   Procedure: COLONOSCOPY;  Surgeon: Carol Ada, MD;  [facility];  Service: Endoscopy;  Laterality: Left;   COLONOSCOPY WITH PROPOFOL N/A 11/23/2015   Procedure: COLONOSCOPY WITH  PROPOFOL;  Surgeon: Carol Ada, MD;  [facility]: [facility];  Service: Endoscopy;  Laterality: N/A;   CORONARY ANGIOPLASTY     ESOPHAGOGASTRODUODENOSCOPY (EGD) WITH PROPOFOL N/A 11/23/2015   Procedure: ESOPHAGOGASTRODUODENOSCOPY (EGD) WITH PROPOFOL;  Surgeon: Carol Ada, MD;  [facility];  Service: Endoscopy;  Laterality: N/A;   EXCISIONAL HEMORRHOIDECTOMY  1980's   GIVENS CAPSULE STUDY N/A 02/29/2016   Procedure: GIVENS CAPSULE STUDY;  Surgeon: Carol Ada, MD;  [facility];  Service: Endoscopy;  Laterality: N/A;   LAPAROSCOPIC SMALL BOWEL RESECTION N/A 06/30/2017   Procedure: LAPAROSCOPIC SMALL BOWEL RESECTION;  Surgeon: Leighton Ruff, MD;  Location: [facility];  Service: General;  Laterality: N/A;   PERIPHERAL VASCULAR CATHETERIZATION N/A 04/24/2015   Procedure: Lower Extremity Angiography;  Surgeon: [name], MD;  Location: [facility];  Service: Cardiovascular;  Laterality: N/A;   PERIPHERAL VASCULAR CATHETERIZATION N/A 05/29/2015   Procedure: Lower Extremity Angiography;  Surgeon: [name], MD;  Location: [facility];  Service: Cardiovascular;  Laterality: N/A;   PERIPHERAL VASCULAR CATHETERIZATION Left 05/29/2015   Procedure: Renal Angiography;  Surgeon: [name], MD;  Location: [facility];  Service: Cardiovascular;  Laterality: Left;   PERIPHERAL VASCULAR CATHETERIZATION Left 06/12/2015   Procedure: Peripheral Vascular Atherectomy;  Surgeon: [name], MD;   Location: [facility];  Service: Cardiovascular;  Laterality: Left;  AT   PERIPHERAL VASCULAR CATHETERIZATION Left 06/12/2015   Procedure: Peripheral Vascular Intervention;  Surgeon: [name], MD;  Location: [place] [facility];  Service: Cardiovascular;  Laterality: Left;  PTA TP TRUNK   UPPER GI ENDOSCOPY  12/16/11   findings:  mild gastritis and duodenitis    ROS: Review of Systems Negative except as stated above  PHYSICAL EXAM: BP 130/80   Pulse 71   Resp 16   Wt 181 lb 12.8 oz (82.5 kg)   SpO2 97%   BMI 28.47 kg/m   Wt Readings from Last 3 Encounters:  04/05/21 181 lb 12.8 oz (82.5 kg)  01/23/21 181 lb (82.1 kg)  09/11/20 183 lb 12.8 oz (83.4 kg)    Physical Exam General appearance - alert, well appearing, and in no distress Mental status - normal mood, behavior, speech, dress, motor activity, and thought processes Neck - supple, no significant adenopathy Chest - clear to auscultation, no wheezes, rales or rhonchi, symmetric air entry Heart - normal rate, regular rhythm, normal S1, S2, no murmurs, rubs, clicks or gallops Extremities - peripheral pulses normal, no pedal edema, no clubbing or cyanosis Diabetic Foot Exam - Simple   Simple Foot Form Visual Inspection No deformities, no ulcerations, no other skin breakdown bilaterally: Yes Sensation Testing Intact to touch and monofilament testing bilaterally: Yes Pulse Green See comments: Yes Comments DP pulses 2+.  Feet warm     CMP Latest Ref Rng & Units 03/14/2021 09/06/2020 11/08/2019  Glucose 65 - 99 mg/dL 253(H) 291(H) 369(H)  BUN 8 - 27 mg/dL _0 Creatinine 0.76 - 1.27 mg/dL 1.29(H) 1.60(H) 1.45(H)  Sodium 134 - 144 mmol/L 135 135 135  Potassium 3.5 - 5.2 mmol/L 4.3 CANCELED 3.6  Chloride 96 - 106 mmol/L 98 96 93(L)  CO2 20 - 29 mmol/L _1 Calcium 8.6 - 10.2 mg/dL 9.7 9.8 9.8  Total Protein 6.0 - 8.5 g/dL - 8.4 7.5  Total Bilirubin 0.0 - 1.2 mg/dL - 0.6 0.6  Alkaline Phos 44 - 121 IU/L -  63 60  AST 0 - 40 IU/L -  24 19  ALT 0 - 44 IU/L - 13 18   Lipid Panel     Component Value Date/Time   CHOL 115 09/06/2020 1152   TRIG 75 09/06/2020 1152   HDL 52 09/06/2020 1152   CHOLHDL 2.2 09/06/2020 1152   CHOLHDL 3.7 10/31/2015 1559   VLDL 27 10/31/2015 1559   LDLCALC 48 09/06/2020 1152    CBC    Component Value Date/Time   WBC 8.4 09/06/2020 1152   WBC 7.2 07/04/2017 0600   RBC 4.73 09/06/2020 1152   RBC 3.89 (L) 07/04/2017 0600   HGB 14.3 09/06/2020 1152   HCT 42.7 09/06/2020 1152   PLT 231 09/06/2020 1152   MCV 90 09/06/2020 1152   MCH 30.2 09/06/2020 1152   MCH 26.2 07/04/2017 0600   MCHC 33.5 09/06/2020 1152   MCHC 31.3 07/04/2017 0600   RDW 12.0 09/06/2020 1152   LYMPHSABS 1.1 07/04/2017 0600   LYMPHSABS 0.9 01/28/2017 0957   MONOABS 0.6 07/04/2017 0600   EOSABS 0.1 07/04/2017 0600   EOSABS 0.1 01/28/2017 0957   BASOSABS 0.1 07/04/2017 0600   BASOSABS 0.1 01/28/2017 0957    ASSESSMENT AND PLAN: 1. Type 2 diabetes, controlled, with peripheral circulatory disorder (HCC) A1c has improved but still not at goal.  I recommend increasing the Farxiga to 10 mg daily.  Continue healthy eating habits and regular exercise.  GFR is okay for [place] to continue metformin. - POCT glucose (manual entry) - POCT glycosylated hemoglobin (Hb A1C) - Ambulatory referral to Ophthalmology  2. Hypertension associated with diabetes ([place]) At goal.  Continue current medications and low-salt diet.  3. Stage 3a chronic kidney disease ([place]) Continue to monitor kidney function.  Advised to avoid taking NSAIDs.  4. Coronary artery disease involving native coronary artery of native heart without angina pectoris Currently stable.  Last DL at goal.  Continue Crestor, bisoprolol, aspirin     Patient was given the opportunity to ask questions.  Patient verbalized understanding of the plan and was able to repeat key elements of the plan.   Orders Placed This Encounter  Procedures    Ambulatory referral to Ophthalmology   POCT glucose (manual entry)   POCT glycosylated hemoglobin (Hb A1C)     Requested Prescriptions   Signed Prescriptions Disp Refills   dapagliflozin propanediol (FARXIGA) 10 MG TABS tablet 30 tablet 6    Sig: Take 1 tablet (10 mg total) by mouth daily before breakfast.    Return in about 4 months (around 08/05/2021).  Karle Plumber, MD, FACP

## 2021-04-05 NOTE — Patient Instructions (Signed)
Your diabetes has improved but it is not at goal.  I recommend increasing the Farxiga from 5 mg daily to 10 mg daily.  I have sent an updated prescription to your pharmacy.  I have submitted a referral for your eye exam.

## 2021-05-22 ENCOUNTER — Other Ambulatory Visit: Payer: Self-pay

## 2021-05-22 NOTE — Patient Outreach (Signed)
Aging Gracefully Program  05/22/2021  [name] March 11, 1946 [id number]   [place] RN 4th home visit ( final) After completion of wife's home visit. I went to speak with patient who was in the den area and he refused to speak with me.  Will close case and notify OT.  Tomasa Rand RN, BSN, Careers information officer for [employer] Mobile: [phone]

## 2021-07-01 ENCOUNTER — Other Ambulatory Visit: Payer: Self-pay | Admitting: Cardiovascular Disease

## 2021-07-01 NOTE — Telephone Encounter (Signed)
Rx(s) sent to pharmacy electronically.  

## 2021-08-03 ENCOUNTER — Other Ambulatory Visit: Payer: Self-pay | Admitting: Occupational Therapy

## 2021-08-07 ENCOUNTER — Other Ambulatory Visit: Payer: Self-pay

## 2021-08-07 NOTE — Patient Instructions (Addendum)
OT Goal 1:He would like to be able to get on and off toilet(s) easier (2 comfort height toilets and grab bars as able). New toilets were installed.   OT Goal #2:He would like to be able to pick up items off of the floor more easily. (A reacher would benefit him)--reacher was provided.   OT Goal #2:He would like to feel more safe getting in front door (right side railing needs secured and perhaps a handle at front door or inside door facing). New porch with new steps and railings/handrails built. Still needs grab bars at front door where they step in--[facility] was emailed about this.  OT Goal #4:He would like to know when someone is at their front door (needs a doorbell)--A door bell was provided.

## 2021-08-07 NOTE — Patient Outreach (Signed)
Aging Gracefully Program  OT FINAL Visit  08/07/2021  [name] October 05, 1945 [id number]  Visit:  2- Second Visit  Start Time:  1430 End Time:  1440 Total Minutes:  10  Durable Medical Equipment: Adaptive Equipment: Reacher Adaptive Equipment Distribution Date: 08/03/21  Patient Education: Education Provided: No  Goals:  Goals Addressed             This Visit's Progress    COMPLETED: Patient Stated       He would like to be able to get on and off toilet(s) easier (2 comfort height toilets and grab bars as able). New toilets were installed.     COMPLETED: Patient Stated       He would like to be able to pick up items off of the floor more easily. (A reacher would benefit him)--reacher was provided.     COMPLETED: Patient Stated       He would like to feel more safe getting in front door (right side railing needs secured and perhaps a handle at front door or inside door facing). New porch with new steps and railings/handrails built. Still needs grab bars at front door where they step in--[facility] was emailed about this.     COMPLETED: Patient Stated       He would like to know when someone is at their front door (needs a doorbell)--A door bell was provided.        Post Clinical Reasoning: Client Action (Goal) Two Interventions: Pt provided with reacher Did Client Try?: No Did Client Try?: No Targeted Problem Area Status:  (reported his wife would probably use it more than him) Clinician View Of Client Situation:: [name] greeted me at the front door saying hello and that [name] would be with me shortly. I took the opportunity to give him the reacher which he said he had seen them, but never had one. We had a brief conversation on how it may help him in the house or outside. He said his wife would probably get alot of use out of it. [name] then entered the living room and [name] left the room not to return. When asked if Mr. Malek would be re-joining [place]  [name] said no and then later apologized. [name] said she was happy with the new door bell, the front porch with railings and hand rails and the tall toilet in her bathroom. Client View Of His/Her Situation:: Minimal participation this session Next Visit Plan:: This was my last visit with him.  [place], OTR/L Aging Gracefully [phone]

## 2021-08-30 ENCOUNTER — Other Ambulatory Visit: Payer: Self-pay | Admitting: Internal Medicine

## 2021-08-30 DIAGNOSIS — E118 Type 2 diabetes mellitus with unspecified complications: Secondary | ICD-10-CM

## 2021-08-30 NOTE — Telephone Encounter (Signed)
Requested Prescriptions  Pending Prescriptions Disp Refills   metFORMIN (GLUCOPHAGE) 500 MG tablet [Pharmacy Med Name: METFORMIN HCL 500 MG TABLET] 180 tablet 0    Sig: TAKE 1 TABLET BY MOUTH 2 TIMES DAILY WITH A MEAL.     Endocrinology:  Diabetes - Biguanides Failed - 08/30/2021  1:33 AM      Failed - Cr in normal range and within 360 days    Creat  Date Value Ref Range Status  10/31/2015 1.35 (H) 0.70 - 1.18 mg/dL Final   Creatinine, Ser  Date Value Ref Range Status  03/14/2021 1.29 (H) 0.76 - 1.27 mg/dL Final         Failed - HBA1C is between 0 and 7.9 and within 180 days    HbA1c, POC (controlled diabetic range)  Date Value Ref Range Status  04/05/2021 9.2 (A) 0.0 - 7.0 % Final         Failed - AA eGFR in normal range and within 360 days    GFR calc Af Amer  Date Value Ref Range Status  09/06/2020 48 (L) >59 mL/min/1.73 Final    Comment:    **In accordance with recommendations from the NKF-ASN Task force,**   [facility] is in the process of updating its eGFR calculation to the   2021 CKD-EPI creatinine equation that estimates kidney function   without a race variable.    GFR calc non Af Amer  Date Value Ref Range Status  09/06/2020 42 (L) >59 mL/min/1.73 Final   eGFR  Date Value Ref Range Status  03/14/2021 57 (L) >59 mL/min/1.73 Final         Passed - Valid encounter within last 6 months    Recent Outpatient Visits          4 months ago Type 2 diabetes, controlled, with peripheral circulatory disorder [facility])   [place] [name], MD   7 months ago Medicare annual wellness visit, subsequent   [place], Carroll Sage, FNP   11 months ago Diabetes mellitus type 2, uncontrolled, with complications [facility])   [place], Deborah B, MD   3 years ago Diabetes mellitus type 2, uncontrolled, with complications [facility])   [place], MD   3 years ago Diabetes mellitus type 2, uncontrolled, with complications [facility])   [place], RPH-CPP

## 2021-10-03 ENCOUNTER — Other Ambulatory Visit: Payer: Self-pay

## 2021-10-03 NOTE — Patient Outreach (Signed)
Aging Gracefully Program  10/03/2021  [name] October 04, 1945 [id number]  [facility] Evaluation Interviewer made contact with patient. Aging Gracefully 5 month survey completed.   Interviewer will send referral to [name] at [facility] for follow up.  [place] Management Assistant  [phone]

## 2021-11-26 ENCOUNTER — Other Ambulatory Visit: Payer: Self-pay | Admitting: Internal Medicine

## 2021-11-26 DIAGNOSIS — E118 Type 2 diabetes mellitus with unspecified complications: Secondary | ICD-10-CM

## 2021-11-27 ENCOUNTER — Other Ambulatory Visit: Payer: Self-pay | Admitting: Cardiovascular Disease

## 2021-11-27 NOTE — Telephone Encounter (Signed)
Requested medication (s) are due for refill today: yes ? ?Requested medication (s) are on the active medication list: yes ? ?Last refill: 08/30/21 #180/0 ? ?Future visit scheduled: no ? ?Notes to clinic:  pt is due for updated labs and needs an appt. Pt called, LVMTCB ? ? ?  ?Requested Prescriptions  ?Pending Prescriptions Disp Refills  ? metFORMIN (GLUCOPHAGE) 500 MG tablet [Pharmacy Med Name: METFORMIN HCL 500 MG TABLET] 180 tablet 0  ?  Sig: TAKE 1 TABLET BY MOUTH 2 TIMES DAILY WITH A MEAL.  ?  ? Endocrinology:  Diabetes - Biguanides Failed - 11/26/2021  8:30 AM  ?  ?  Failed - Cr in normal range and within 360 days  ?  Creat  ?Date Value Ref Range Status  ?10/31/2015 1.35 (H) 0.70 - 1.18 mg/dL Final  ? ?Creatinine, Ser  ?Date Value Ref Range Status  ?03/14/2021 1.29 (H) 0.76 - 1.27 mg/dL Final  ?  ?  ?  ?  Failed - HBA1C is between 0 and 7.9 and within 180 days  ?  HbA1c, POC (controlled diabetic range)  ?Date Value Ref Range Status  ?04/05/2021 9.2 (A) 0.0 - 7.0 % Final  ?  ?  ?  ?  Failed - eGFR in normal range and within 360 days  ?  GFR calc Af Amer  ?Date Value Ref Range Status  ?09/06/2020 48 (L) >59 mL/min/1.73 Final  ?  Comment:  ?  **In accordance with recommendations from the NKF-ASN Task force,** ?  [facility] is in the process of updating its eGFR calculation to the ?  2021 CKD-EPI creatinine equation that estimates kidney function ?  without a race variable. ?  ? ?GFR calc non Af Amer  ?Date Value Ref Range Status  ?09/06/2020 42 (L) >59 mL/min/1.73 Final  ? ?eGFR  ?Date Value Ref Range Status  ?03/14/2021 57 (L) >59 mL/min/1.73 Final  ?  ?  ?  ?  Failed - B12 Level in normal range and within 720 days  ?  Vitamin B-12  ?Date Value Ref Range Status  ?08/25/2010 261 211 - 911 pg/mL Final  ?  ?  ?  ?  Failed - Valid encounter within last 6 months  ?  Recent Outpatient Visits   ? ?      ? 7 months ago Type 2 diabetes, controlled, with peripheral circulatory disorder ([place])  ? [place] [name], MD  ? 10 months ago Medicare annual wellness visit, subsequent  ? [place] Scot Jun, FNP  ? 1 year ago Diabetes mellitus type 2, uncontrolled, with complications ([place])  ? [place] Karle Plumber B, MD  ? 3 years ago Diabetes mellitus type 2, uncontrolled, with complications ([place])  ? [place] Karle Plumber B, MD  ? 3 years ago Diabetes mellitus type 2, uncontrolled, with complications ([place])  ? [place], RPH-CPP  ? ?  ?  ? ?  ?  ?  Failed - CBC within normal limits and completed in the last 12 months  ?  WBC  ?Date Value Ref Range Status  ?09/06/2020 8.4 3.4 - 10.8 x10E3/uL Final  ?07/04/2017 7.2 4.0 - 10.5 K/uL Final  ? ?RBC  ?Date Value Ref Range Status  ?09/06/2020 4.73 4.14 - 5.80 x10E6/uL Final  ?07/04/2017 3.89 (L) 4.22 - 5.81 MIL/uL Final  ? ?  Hemoglobin  ?Date Value Ref Range Status  ?09/06/2020 14.3 13.0 - 17.7 g/dL Final  ? ?Hematocrit  ?Date Value Ref Range Status  ?09/06/2020 42.7 37.5 - 51.0 % Final  ? ?MCHC  ?Date Value Ref Range Status  ?09/06/2020 33.5 31.5 - 35.7 g/dL Final  ?07/04/2017 31.3 30.0 - 36.0 g/dL Final  ? ?MCH  ?Date Value Ref Range Status  ?09/06/2020 30.2 26.6 - 33.0 pg Final  ?07/04/2017 26.2 26.0 - 34.0 pg Final  ? ?MCV  ?Date Value Ref Range Status  ?09/06/2020 90 79 - 97 fL Final  ? ?Platelet Count, POC  ?Date Value Ref Range Status  ?10/31/2015 367 142 - 424 K/uL Final  ? ?RDW  ?Date Value Ref Range Status  ?09/06/2020 12.0 11.6 - 15.4 % Final  ? ?RDW, POC  ?Date Value Ref Range Status  ?10/31/2015 17.6 % Final  ? ?  ?  ?  ? ?

## 2021-11-27 NOTE — Telephone Encounter (Signed)
Refill request

## 2021-12-21 ENCOUNTER — Other Ambulatory Visit: Payer: Self-pay | Admitting: Internal Medicine

## 2021-12-21 DIAGNOSIS — E118 Type 2 diabetes mellitus with unspecified complications: Secondary | ICD-10-CM

## 2021-12-27 ENCOUNTER — Other Ambulatory Visit: Payer: Self-pay | Admitting: Cardiovascular Disease

## 2021-12-27 NOTE — Telephone Encounter (Signed)
Rx(s) sent to pharmacy electronically.  

## 2022-01-10 ENCOUNTER — Other Ambulatory Visit: Payer: Self-pay | Admitting: Cardiovascular Disease

## 2022-01-10 NOTE — Telephone Encounter (Signed)
Refill request

## 2022-01-17 ENCOUNTER — Other Ambulatory Visit: Payer: Self-pay | Admitting: Internal Medicine

## 2022-01-17 DIAGNOSIS — E118 Type 2 diabetes mellitus with unspecified complications: Secondary | ICD-10-CM

## 2022-01-17 NOTE — Telephone Encounter (Signed)
Requested medication (s) are due for refill today: yes  Requested medication (s) are on the active medication list: yes  Last refill:  08/30/21 #180  Future visit scheduled: yes  Notes to clinic:  overdue labs and office visit   Requested Prescriptions  Pending Prescriptions Disp Refills   metFORMIN (GLUCOPHAGE) 500 MG tablet [Pharmacy Med Name: METFORMIN HCL 500 MG TABLET] 180 tablet 0    Sig: TAKE 1 TABLET BY MOUTH 2 TIMES DAILY WITH A MEAL.     Endocrinology:  Diabetes - Biguanides Failed - 01/17/2022  2:37 AM      Failed - Cr in normal range and within 360 days    Creat  Date Value Ref Range Status  10/31/2015 1.35 (H) 0.70 - 1.18 mg/dL Final   Creatinine, Ser  Date Value Ref Range Status  03/14/2021 1.29 (H) 0.76 - 1.27 mg/dL Final         Failed - HBA1C is between 0 and 7.9 and within 180 days    HbA1c, POC (controlled diabetic range)  Date Value Ref Range Status  04/05/2021 9.2 (A) 0.0 - 7.0 % Final         Failed - eGFR in normal range and within 360 days    GFR calc Af Amer  Date Value Ref Range Status  09/06/2020 48 (L) >59 mL/min/1.73 Final    Comment:    **In accordance with recommendations from the NKF-ASN Task force,**   [facility] is in the process of updating its eGFR calculation to the   2021 CKD-EPI creatinine equation that estimates kidney function   without a race variable.    GFR calc non Af Amer  Date Value Ref Range Status  09/06/2020 42 (L) >59 mL/min/1.73 Final   eGFR  Date Value Ref Range Status  03/14/2021 57 (L) >59 mL/min/1.73 Final         Failed - B12 Level in normal range and within 720 days    Vitamin B-12  Date Value Ref Range Status  08/25/2010 261 211 - 911 pg/mL Final         Failed - Valid encounter within last 6 months    Recent Outpatient Visits           9 months ago Type 2 diabetes, controlled, with peripheral circulatory disorder [facility])   [place] [name], MD   11 months  ago Medicare annual wellness visit, subsequent   [place], Carroll Sage, FNP   1 year ago Diabetes mellitus type 2, uncontrolled, with complications [facility])   [place] Karle Plumber B, MD   3 years ago Diabetes mellitus type 2, uncontrolled, with complications [facility])   [place], MD   3 years ago Diabetes mellitus type 2, uncontrolled, with complications [facility])   [place], RPH-CPP       Future Appointments             In 4 days [name], MD [place]             Failed - CBC within normal limits and completed in the last 12 months    WBC  Date Value Ref Range Status  09/06/2020 8.4 3.4 - 10.8 x10E3/uL Final  07/04/2017 7.2 4.0 - 10.5 K/uL Final   RBC  Date Value Ref Range  Status  09/06/2020 4.73 4.14 - 5.80 x10E6/uL Final  07/04/2017 3.89 (L) 4.22 - 5.81 MIL/uL Final   Hemoglobin  Date Value Ref Range Status  09/06/2020 14.3 13.0 - 17.7 g/dL Final   Hematocrit  Date Value Ref Range Status  09/06/2020 42.7 37.5 - 51.0 % Final   MCHC  Date Value Ref Range Status  09/06/2020 33.5 31.5 - 35.7 g/dL Final  07/04/2017 31.3 30.0 - 36.0 g/dL Final   [facility]  Date Value Ref Range Status  09/06/2020 30.2 26.6 - 33.0 pg Final  07/04/2017 26.2 26.0 - 34.0 pg Final   MCV  Date Value Ref Range Status  09/06/2020 90 79 - 97 fL Final   Platelet Count, POC  Date Value Ref Range Status  10/31/2015 367 142 - 424 K/uL Final   RDW  Date Value Ref Range Status  09/06/2020 12.0 11.6 - 15.4 % Final   RDW, POC  Date Value Ref Range Status  10/31/2015 17.6 % Final

## 2022-01-21 ENCOUNTER — Encounter: Payer: Self-pay | Admitting: Internal Medicine

## 2022-01-21 ENCOUNTER — Ambulatory Visit: Payer: Medicare HMO | Attending: Internal Medicine | Admitting: Internal Medicine

## 2022-01-21 VITALS — BP 179/93 | HR 60 | Temp 98.1°F | Resp 16 | Wt 187.2 lb

## 2022-01-21 DIAGNOSIS — R0602 Shortness of breath: Secondary | ICD-10-CM | POA: Insufficient documentation

## 2022-01-21 DIAGNOSIS — E1159 Type 2 diabetes mellitus with other circulatory complications: Secondary | ICD-10-CM | POA: Diagnosis not present

## 2022-01-21 DIAGNOSIS — I739 Peripheral vascular disease, unspecified: Secondary | ICD-10-CM | POA: Insufficient documentation

## 2022-01-21 DIAGNOSIS — R079 Chest pain, unspecified: Secondary | ICD-10-CM | POA: Insufficient documentation

## 2022-01-21 DIAGNOSIS — I2510 Atherosclerotic heart disease of native coronary artery without angina pectoris: Secondary | ICD-10-CM | POA: Insufficient documentation

## 2022-01-21 DIAGNOSIS — I152 Hypertension secondary to endocrine disorders: Secondary | ICD-10-CM

## 2022-01-21 DIAGNOSIS — R195 Other fecal abnormalities: Secondary | ICD-10-CM | POA: Insufficient documentation

## 2022-01-21 DIAGNOSIS — K625 Hemorrhage of anus and rectum: Secondary | ICD-10-CM | POA: Insufficient documentation

## 2022-01-21 DIAGNOSIS — E1151 Type 2 diabetes mellitus with diabetic peripheral angiopathy without gangrene: Secondary | ICD-10-CM | POA: Diagnosis not present

## 2022-01-21 DIAGNOSIS — D500 Iron deficiency anemia secondary to blood loss (chronic): Secondary | ICD-10-CM | POA: Insufficient documentation

## 2022-01-21 DIAGNOSIS — N1831 Chronic kidney disease, stage 3a: Secondary | ICD-10-CM

## 2022-01-21 DIAGNOSIS — E119 Type 2 diabetes mellitus without complications: Secondary | ICD-10-CM | POA: Insufficient documentation

## 2022-01-21 LAB — POCT GLYCOSYLATED HEMOGLOBIN (HGB A1C): Hemoglobin A1C: 8.3 % — AB (ref 4.0–5.6)

## 2022-01-21 LAB — GLUCOSE, POCT (MANUAL RESULT ENTRY): POC Glucose: 289 mg/dl — AB (ref 70–99)

## 2022-01-21 NOTE — Patient Instructions (Signed)
Dapagliflozin Tablets What is this medication? DAPAGLIFLOZIN (DAP a gli FLOE zin) treats type 2 diabetes. It works by helping your kidneys remove sugar (glucose) from your blood through the urine, which decreases your blood sugar. It can also be used to lower the risk of heart attack, stroke, kidney disease, and hospitalization for heart failure in people with type 2 diabetes. Changes to diet and exercise are often combined with this medication. This medicine may be used for other purposes; ask your health care provider or pharmacist if you have questions. COMMON BRAND NAME(S): Farxiga What should I tell my care team before I take this medication? They need to know if you have any of these conditions: Dehydration Diabetic ketoacidosis Diet low in salt Eating less due to illness, surgery, dieting, or any other reason Frequently drink alcohol Having surgery History of pancreatitis or pancreas problems History of yeast infection of the penis or vagina Infection in the bladder, kidneys, or urinary tract Kidney disease Low blood pressure On dialysis Problems urinating Type 1 diabetes Uncircumcised male An unusual or allergic reaction to dapagliflozin, other medications, foods, dyes, or preservatives Pregnant or trying to get pregnant Breast-feeding How should I use this medication? Take this medication by mouth with water. Take it as directed on the prescription label at the same time every day. You can take it with or without food. If it upsets your stomach, take it with food. Keep taking it unless your care team tells you to stop. A special MedGuide will be given to you by the pharmacist with each prescription and refill. Be sure to read this information carefully each time. Talk to your care team about the use of this medication in children. Special care may be needed. Overdosage: If you think you have taken too much of this medicine contact a poison control center or emergency room at  once. NOTE: This medicine is only for you. Do not share this medicine with others. What if I miss a dose? If you miss a dose, take it as soon as you can. If it is almost time for your next dose, take only that dose. Do not take double or extra doses. What may interact with this medication? Lithium Sulfonylureas, such as glimepiride, glipizide, glyburide This list may not describe all possible interactions. Give your health care provider a list of all the medicines, herbs, non-prescription drugs, or dietary supplements you use. Also tell them if you smoke, drink alcohol, or use illegal drugs. Some items may interact with your medicine. What should I watch for while using this medication? Visit your care team for regular checks on your progress. Tell your care team if your symptoms do not start to get better or if they get worse. This medication can cause a serious condition in which there is too much acid in the blood. If you develop nausea, vomiting, stomach pain, unusual tiredness, or breathing problems, stop taking this medication and call your care team right away. If possible, use a ketone dipstick to check for ketones in your urine. Check with your care team if you have severe diarrhea, nausea, and vomiting, or if you sweat a lot. The loss of too much body fluid may make it dangerous for you to take this medication. A test called the HbA1C (A1C) will be monitored. This is a simple blood test. It measures your blood sugar control over the last 2 to 3 months. You will receive this test every 3 to 6 months. Learn how to check your   blood sugar. Learn the symptoms of low and high blood sugar and how to manage them. Always carry a quick-source of sugar with you in case you have symptoms of low blood sugar. Examples include hard sugar candy or glucose tablets. Make sure others know that you can choke if you eat or drink when you develop serious symptoms of low blood sugar, such as seizures or  unconsciousness. Get medical help at once. Tell your care team if you have high blood sugar. You might need to change the dose of your medication. If you are sick or exercising more than usual, you may need to change the dose of your medication. Do not skip meals. Ask your care team if you should avoid alcohol. Many nonprescription cough and cold products contain sugar or alcohol. These can affect blood sugar. Wear a medical ID bracelet or chain. Carry a card that describes your condition. List the medications and doses you take on the card. What side effects may I notice from receiving this medication? Side effects that you should report to your care team as soon as possible: Allergic reactions--skin rash, itching, hives, swelling of the face, lips, tongue, or throat Dehydration--increased thirst, dry mouth, feeling faint or lightheaded, headache, dark yellow or brown urine Diabetic ketoacidosis (DKA)--increased thirst or amount of urine, dry mouth, fatigue, fruity odor to breath, trouble breathing, stomach pain, nausea, vomiting Genital yeast infection--redness, swelling, pain, or itchiness, odor, thick or lumpy discharge New pain or tenderness, change in skin color, sores or ulcers, infection of the leg or foot Infection or redness, swelling, tenderness, or pain in the genitals, or area from the genitals to the back of the rectum Urinary tract infection (UTI)--burning when passing urine, passing frequent small amounts of urine, bloody or cloudy urine, pain in the lower back or sides This list may not describe all possible side effects. Call your doctor for medical advice about side effects. You may report side effects to FDA at 1-800-FDA-1088. Where should I keep my medication? Keep out of the reach of children and pets. Store at room temperature between 20 and 25 degrees C (68 and 77 degrees F). Get rid of any unused medication after the expiration date. To get rid of medications that are no  longer needed or have expired: Take the medication to a medication take-back program. Check with your pharmacy or law enforcement to find a location. If you cannot return the medication, check the label or package insert to see if the medication should be thrown out in the garbage or flushed down the toilet. If you are not sure, ask your care team. If it is safe to put it in the trash, take the medication out of the container. Mix the medication with cat litter, dirt, coffee grounds, or other unwanted substance. Seal the mixture in a bag or container. Put it in the trash. NOTE: This sheet is a summary. It may not cover all possible information. If you have questions about this medicine, talk to your doctor, pharmacist, or health care provider.  2023 Elsevier/Gold Standard (2021-06-06 00:00:00)

## 2022-01-21 NOTE — Progress Notes (Signed)
Patient ID: [name], male    DOB: 07-Oct-1945  MRN: [id number]  CC: chronic ds management  Subjective: [name] is a 76 y.o. male who presents for chronic disease management. His concerns today include:  Pt with hx of  small bowel bleeding with resection of benign neuroendocrine tumor  06/2017, CAD with hx of PCI, PVD, CKD, HTN, HL, DM type 2   DM: Results for orders placed or performed in visit on 01/21/22  POCT glucose (manual entry)  Result Value Ref Range   POC Glucose 289 (A) 70 - 99 mg/dl  POCT glycosylated hemoglobin (Hb A1C)  Result Value Ref Range   Hemoglobin A1C 8.3 (A) 4.0 - 5.6 %   HbA1c POC (<> result, manual entry)     HbA1c, POC (prediabetic range)     HbA1c, POC (controlled diabetic range)    A1C dec from last visit but still not at goal.  Reports he takes his meds but not daily.  He is supposed to be on metformin 500 mg twice a day and Farxiga 10 mg daily.  He tells me he does not take his medicines every day because he feels good and he wants more information on Farxiga Walks his dog daily.  Feels he does okay with his eating habits. Overdue for eye exam.  Agreeable to referral.  HTN/CAD/PAD:  Currently taking: see medication list Med Adherence: '[]'  Yes    '[x]'  No - did not take meds as yet today. Takes the [place] and norvasc but not daily as prescribed.  He tells me that he takes them every other day for the same reason he gives for not taking his diabetes medicines daily. Medication side effects: '[]'  Yes    '[x]'  No Adherence with salt restriction: '[x]'  Yes    '[]'  No Home Monitoring?: '[x]'  Yes    '[]'  No Monitoring Frequency: 1-2x/wk Home BP results range: Does not have log with him and does not recall the readings but reports it stays about the same.  Reports the highest SBP is 150s SOB? '[]'  Yes    '[x]'  No Chest Pain?: '[]'  Yes    '[x]'  No Leg swelling?: '[]'  Yes    '[x]'  No Headaches?: '[]'  Yes    '[x]'  No Dizziness? '[]'  Yes    '[x]'  No Comments: no pain in calf/legs  with walking.  Walks about 1/2 mile a day with his dog  CKD 3a: Not on NSAIDS.  Wants to know what is wrong with his kidneys  HM:  Due for Shingrix, and eye exam. Declines Shingdrix Patient Active Problem List   Diagnosis Date Noted   Rectal bleeding 01/21/2022   Feces contents abnormal 01/21/2022   Anemia due to chronic blood loss 01/21/2022   Chest pain 01/21/2022   Atherosclerotic heart disease of native coronary artery without angina pectoris 01/21/2022   Shortness of breath 01/21/2022   Peripheral vascular disease ([place]) 01/21/2022   Diabetes mellitus type 2, controlled, without complications ([place]) 62/13/0865   Influenza vaccine needed 09/06/2020   History of medication noncompliance 09/06/2020   Stage 3a chronic kidney disease ([place]) 09/06/2020   Benign neuroendocrine tumor of small intestine 09/02/2017   Hypertensive cardiovascular disease 06/25/2017   PVC's (premature ventricular contractions) 01/26/2017   CAD S/P percutaneous coronary angioplasty 10/03/2016   Chest pain with high risk for cardiac etiology 04/22/2015   Claudication in peripheral vascular disease ([place]) 04/22/2015   Anemia, iron deficiency 10/17/2014   Essential hypertension 10/17/2014   DM2 (diabetes mellitus, type  2) ([place]) 04/08/2012   PUD (peptic ulcer disease) 11/11/2011     Current Outpatient Medications on File Prior to Visit  Medication Sig Dispense Refill   amLODipine (NORVASC) 10 MG tablet TAKE 1 TABLET BY MOUTH DAILY. PATIENT MUST SCHEDULE APPOINTMENT FOR FUTURE REFILLS. FIRST ATTEMPT 90 tablet 1   aspirin 81 MG tablet Take 1 tablet (81 mg total) by mouth daily. 30 tablet 11   bisoprolol (ZEBETA) 5 MG tablet TAKE 1 TABLET (5 MG TOTAL) BY MOUTH DAILY. 90 tablet 0   Blood Glucose Monitoring Suppl (ONETOUCH VERIO) w/Device KIT USE 3 TIMES DAILY DxE11.8 1 kit EACH   dapagliflozin propanediol (FARXIGA) 10 MG TABS tablet Take 1 tablet (10 mg total) by mouth daily before breakfast. 30 tablet 6   glucose  blood test strip Use as instructed 100 each 12   Lancets (ONETOUCH ULTRASOFT) lancets Use as instructed 100 each 12   metFORMIN (GLUCOPHAGE) 500 MG tablet TAKE 1 TABLET BY MOUTH 2 TIMES DAILY WITH A MEAL. 60 tablet 0   Misc. Devices MISC Blood pressure monitor. 1 each 0   rosuvastatin (CRESTOR) 40 MG tablet Take 1 tablet (40 mg total) by mouth at bedtime. 90 tablet 3   No current facility-administered medications on file prior to visit.    No Known Allergies  Social History   Socioeconomic History   Marital status: Married    Spouse name: Not on file   Number of children: 2   Years of education: 12   Highest education level: High school graduate  Occupational History   Not on file  Tobacco Use   Smoking status: Former    Packs/day: 1.00    Years: 54.00    Pack years: 54.00    Types: Cigars, Cigarettes    Quit date: 02/16/2015    Years since quitting: 6.9   Smokeless tobacco: Former    Types: Chew   Tobacco comments:    "chewed some when I was young"  Substance and Sexual Activity   Alcohol use: No    Alcohol/week: 0.0 standard drinks    Comment: 04/24/2015 "no beer in 2 wks; ;no liquor in 3-4 weeks; I'm not a real heavy drinker" states he stopped drining 2 yrs ago   Drug use: No   Sexual activity: Never  Other Topics Concern   Not on file  Social History Narrative   Not on file   Social Determinants of Health   Financial Resource Strain: Not on file  Food Insecurity: Not on file  Transportation Needs: Not on file  Physical Activity: Not on file  Stress: Not on file  Social Connections: Not on file  Intimate Partner Violence: Not on file    Family History  Problem Relation Age of Onset   CVA Unknown    Hypertension Unknown    Diabetes Mother    Hypertension Father    Peripheral Artery Disease Father    Alcohol abuse Father    Heart attack Father     Past Surgical History:  Procedure Laterality Date   CARDIAC CATHETERIZATION N/A 04/24/2015   Procedure:  Left Heart Cath and Coronary Angiography;  Surgeon: [name], MD;  Location: [facility];  Service: Cardiovascular;  Laterality: N/A;   CARDIAC CATHETERIZATION N/A 04/24/2015   Procedure: Coronary Stent Intervention;  Surgeon: [name], MD;  Location: [place] [facility];  Service: Cardiovascular;  Laterality: N/A;  Prox RCA   CARDIAC CATHETERIZATION Right 05/29/2015   Procedure: Angioplasty Perioneal and SFA;  Surgeon: [name], MD;  Location: [place] [facility];  Service: Cardiovascular;  Laterality: Right;   COLONOSCOPY Left 02/29/2016   Procedure: COLONOSCOPY;  Surgeon: Carol Ada, MD;  Location: [facility];  Service: Endoscopy;  Laterality: Left;   COLONOSCOPY WITH PROPOFOL N/A 11/23/2015   Procedure: COLONOSCOPY WITH PROPOFOL;  Surgeon: Carol Ada, MD;  Location: [facility];  Service: Endoscopy;  Laterality: N/A;   CORONARY ANGIOPLASTY     ESOPHAGOGASTRODUODENOSCOPY (EGD) WITH PROPOFOL N/A 11/23/2015   Procedure: ESOPHAGOGASTRODUODENOSCOPY (EGD) WITH PROPOFOL;  Surgeon: Carol Ada, MD;  [facility]: [facility];  Service: Endoscopy;  Laterality: N/A;   EXCISIONAL HEMORRHOIDECTOMY  1980's   GIVENS CAPSULE STUDY N/A 02/29/2016   Procedure: GIVENS CAPSULE STUDY;  Surgeon: Carol Ada, MD;  [facility]: [facility];  Service: Endoscopy;  Laterality: N/A;   LAPAROSCOPIC SMALL BOWEL RESECTION N/A 06/30/2017   Procedure: LAPAROSCOPIC SMALL BOWEL RESECTION;  Surgeon: Leighton Ruff, MD;  Location: [facility];  Service: General;  Laterality: N/A;   PERIPHERAL VASCULAR CATHETERIZATION N/A 04/24/2015   Procedure: Lower Extremity Angiography;  Surgeon: [name], MD;  Location: [place] [facility];  Service: Cardiovascular;  Laterality: N/A;   PERIPHERAL VASCULAR CATHETERIZATION N/A 05/29/2015   Procedure: Lower Extremity Angiography;  Surgeon: [name], MD;  Location: [place] [facility];  Service: Cardiovascular;  Laterality: N/A;   PERIPHERAL VASCULAR CATHETERIZATION Left 05/29/2015   Procedure:  Renal Angiography;  Surgeon: [name], MD;  Location: [place] [facility];  Service: Cardiovascular;  Laterality: Left;   PERIPHERAL VASCULAR CATHETERIZATION Left 06/12/2015   Procedure: Peripheral Vascular Atherectomy;  Surgeon: [name], MD;  Location: [place] [facility];  Service: Cardiovascular;  Laterality: Left;  AT   PERIPHERAL VASCULAR CATHETERIZATION Left 06/12/2015   Procedure: Peripheral Vascular Intervention;  Surgeon: [name], MD;  Location: [place] [facility];  Service: Cardiovascular;  Laterality: Left;  PTA TP TRUNK   UPPER GI ENDOSCOPY  12/16/11   findings:  mild gastritis and duodenitis    ROS: Review of Systems Negative except as stated above  PHYSICAL EXAM: BP (!) 179/93   Pulse 60   Temp 98.1 F (36.7 C) (Oral)   Resp 16   Wt 187 lb 3.2 oz (84.9 kg)   SpO2 98%   BMI 29.32 kg/m   Wt Readings from Last 3 Encounters:  01/21/22 187 lb 3.2 oz (84.9 kg)  04/05/21 181 lb 12.8 oz (82.5 kg)  01/23/21 181 lb (82.1 kg)  Repeat blood pressure 170/80  Physical Exam   General appearance - alert, well appearing, elderly African-American male and in no distress Mental status - normal mood, behavior, speech, dress, motor activity, and thought processes Eyes - pupils equal and reactive, extraocular eye movements intact Neck - supple, no significant adenopathy Chest - clear to auscultation, no wheezes, rales or rhonchi, symmetric air entry Heart - normal rate, regular rhythm, normal S1, S2, no murmurs, rubs, clicks or gallops Extremities - peripheral pulses normal, no pedal edema, no clubbing or cyanosis    01/21/2022   11:04 AM 10/03/2021   11:04 AM 04/05/2021   10:17 AM  Depression screen PHQ 2/9  Decreased Interest 0 0 1  Down, Depressed, Hopeless 0 0 0  PHQ - 2 Score 0 0 1  Altered sleeping 0    Tired, decreased energy 0    Change in appetite 0    Feeling bad or failure about yourself  0    Trouble concentrating 0    Moving slowly or fidgety/restless 0  Suicidal thoughts 0    PHQ-9 Score 0         Latest Ref Rng & Units 03/14/2021    9:07 AM 09/06/2020   11:52 AM 11/08/2019   12:11 PM  CMP  Glucose 65 - 99 mg/dL 253   291   369    BUN 8 - 27 mg/dL '15   18   19    ' Creatinine 0.76 - 1.27 mg/dL 1.29   1.60   1.45    Sodium 134 - 144 mmol/L 135   135   135    Potassium 3.5 - 5.2 mmol/L 4.3   CANCELED   3.6    Chloride 96 - 106 mmol/L 98   96   93    CO2 20 - 29 mmol/L '24   23   25    ' Calcium 8.6 - 10.2 mg/dL 9.7   9.8   9.8    Total Protein 6.0 - 8.5 g/dL  8.4   7.5    Total Bilirubin 0.0 - 1.2 mg/dL  0.6   0.6    Alkaline Phos 44 - 121 IU/L  63   60    AST 0 - 40 IU/L  24   19    ALT 0 - 44 IU/L  13   18     Lipid Panel     Component Value Date/Time   CHOL 115 09/06/2020 1152   TRIG 75 09/06/2020 1152   HDL 52 09/06/2020 1152   CHOLHDL 2.2 09/06/2020 1152   CHOLHDL 3.7 10/31/2015 1559   VLDL 27 10/31/2015 1559   LDLCALC 48 09/06/2020 1152    CBC    Component Value Date/Time   WBC 8.4 09/06/2020 1152   WBC 7.2 07/04/2017 0600   RBC 4.73 09/06/2020 1152   RBC 3.89 (L) 07/04/2017 0600   HGB 14.3 09/06/2020 1152   HCT 42.7 09/06/2020 1152   PLT 231 09/06/2020 1152   MCV 90 09/06/2020 1152   MCH 30.2 09/06/2020 1152   MCH 26.2 07/04/2017 0600   MCHC 33.5 09/06/2020 1152   MCHC 31.3 07/04/2017 0600   RDW 12.0 09/06/2020 1152   LYMPHSABS 1.1 07/04/2017 0600   LYMPHSABS 0.9 01/28/2017 0957   MONOABS 0.6 07/04/2017 0600   EOSABS 0.1 07/04/2017 0600   EOSABS 0.1 01/28/2017 0957   BASOSABS 0.1 07/04/2017 0600   BASOSABS 0.1 01/28/2017 0957    ASSESSMENT AND PLAN:  1. Type 2 diabetes, controlled, with peripheral circulatory disorder [facility]) Discussed the importance of good diabetes control and preventing complications of diabetes including cardiovascular risks. -Encourage compliance with medications.  Went over with him how Wilder Glade works in helping to lower blood sugars by increasing urinary excretion of sugars.  Went  over cardiovascular benefits of Farxiga in patients with cardiovascular disease and CHF and also benefit in people with CKD.  Also discussed possible side effects as well.  Printed information given to him on Farxiga. Encourage healthy eating habits and continue regular exercise. - POCT glucose (manual entry) - POCT glycosylated hemoglobin (Hb A1C) - CBC - Comprehensive metabolic panel - Lipid panel - Microalbumin / creatinine urine ratio - Ambulatory referral to Ophthalmology  2. Hypertension associated with diabetes ([place]) Not at goal due to noncompliance with medications.  Encouraged him to take the bisoprolol and amlodipine daily as prescribed.  Discussed risks of acute cardiovascular events with uncontrolled blood pressure.  We will have him follow-up with the clinical pharmacist in a few weeks for repeat blood  pressure check.  Advised to take his medicines before coming.  3. Coronary artery disease involving native coronary artery of native heart without angina pectoris Continue aspirin, Crestor and Zebeta  4. Stage 3a chronic kidney disease ([place]) Recheck kidney function today with chemistry and urine microalbumin.    Patient was given the opportunity to ask questions.  Patient verbalized understanding of the plan and was able to repeat key elements of the plan.   This documentation was completed using Radio producer.  Any transcriptional errors are unintentional.  Orders Placed This Encounter  Procedures   CBC   Comprehensive metabolic panel   Lipid panel   Microalbumin / creatinine urine ratio   Ambulatory referral to Ophthalmology   POCT glucose (manual entry)   POCT glycosylated hemoglobin (Hb A1C)     Requested Prescriptions    No prescriptions requested or ordered in this encounter    Return in about 4 months (around 05/23/2022) for Appt with [facility] in 2 wks for BP check.  Karle Plumber, MD, FACP

## 2022-01-22 ENCOUNTER — Other Ambulatory Visit: Payer: Self-pay

## 2022-01-22 LAB — COMPREHENSIVE METABOLIC PANEL
ALT: 10 IU/L (ref 0–44)
AST: 11 IU/L (ref 0–40)
Albumin/Globulin Ratio: 1.2 (ref 1.2–2.2)
Albumin: 4.3 g/dL (ref 3.7–4.7)
Alkaline Phosphatase: 49 IU/L (ref 44–121)
BUN/Creatinine Ratio: 15 (ref 10–24)
BUN: 20 mg/dL (ref 8–27)
Bilirubin Total: 0.4 mg/dL (ref 0.0–1.2)
CO2: 25 mmol/L (ref 20–29)
Calcium: 9.9 mg/dL (ref 8.6–10.2)
Chloride: 103 mmol/L (ref 96–106)
Creatinine, Ser: 1.32 mg/dL — ABNORMAL HIGH (ref 0.76–1.27)
Globulin, Total: 3.5 g/dL (ref 1.5–4.5)
Glucose: 201 mg/dL — ABNORMAL HIGH (ref 70–99)
Potassium: 4.9 mmol/L (ref 3.5–5.2)
Sodium: 142 mmol/L (ref 134–144)
Total Protein: 7.8 g/dL (ref 6.0–8.5)
eGFR: 56 mL/min/{1.73_m2} — ABNORMAL LOW (ref >59)

## 2022-01-22 LAB — LIPID PANEL
Chol/HDL Ratio: 4.2 ratio (ref 0.0–5.0)
Cholesterol, Total: 208 mg/dL — ABNORMAL HIGH (ref 100–199)
HDL: 50 mg/dL (ref >39)
LDL Chol Calc (NIH): 135 mg/dL — ABNORMAL HIGH (ref 0–99)
Triglycerides: 131 mg/dL (ref 0–149)
VLDL Cholesterol Cal: 23 mg/dL (ref 5–40)

## 2022-01-22 LAB — MICROALBUMIN / CREATININE URINE RATIO
Creatinine, Urine: 78.9 mg/dL
Microalb/Creat Ratio: 7 mg/g creat (ref 0–29)
Microalbumin, Urine: 5.3 ug/mL

## 2022-01-22 LAB — CBC
Hematocrit: 45.4 % (ref 37.5–51.0)
Hemoglobin: 15.2 g/dL (ref 13.0–17.7)
MCH: 30.6 pg (ref 26.6–33.0)
MCHC: 33.5 g/dL (ref 31.5–35.7)
MCV: 91 fL (ref 79–97)
Platelets: 196 10*3/uL (ref 150–450)
RBC: 4.97 x10E6/uL (ref 4.14–5.80)
RDW: 12.9 % (ref 11.6–15.4)
WBC: 7.4 10*3/uL (ref 3.4–10.8)

## 2022-01-22 NOTE — Patient Outreach (Signed)
Aging Gracefully Program  01/22/2022  [name] 1945-09-25 [id number]   [facility] Evaluation Interviewer attempted to call patient on today regarding Aging Gracefully referral. No answer from patient after multiple rings. CMA left confidential voicemail for patient to return call.  Will attempt to call back within 1 week.   [facility] Management Assistant [phone]

## 2022-01-28 ENCOUNTER — Other Ambulatory Visit: Payer: Self-pay | Admitting: Pharmacist

## 2022-01-28 DIAGNOSIS — I2510 Atherosclerotic heart disease of native coronary artery without angina pectoris: Secondary | ICD-10-CM

## 2022-01-28 MED ORDER — ROSUVASTATIN CALCIUM 40 MG PO TABS: 40.0000 mg | ORAL_TABLET | Freq: Every day | ORAL | 1 refills | Status: DC

## 2022-01-28 NOTE — Chronic Care Management (AMB) (Signed)
Patient appearing on report for [employer] [place] Metric - Hypertension Control report due to last documented ambulatory blood pressure of 179/93 on 01/21/22. Next appointment with PCP is not scheduled   Outreached patient to discuss hypertension control and medication management.   Current antihypertensives: amlodipine 10 mg daily, bisoprolol 5 mg daily  Patient has an automated upper arm home BP machine. Does not recall any specific readings.   Notes that he does not regularly take his antihypertensives as he does not feel bad when blood pressures are elevated.   Current antihyperglycemics: metformin 500 mg twice daily, Farxiga 10 mg daily - Wilder Glade has never been filled at the pharmacy. I contacted the pharmacy, copay is $47  Current antihyperlipidemics: prescribed rosuvastatin 40 mg daily, but never filled. I called the pharmacy and asked that they fill this.   Assessment/Plan: - Currently uncontrolled - - Reviewed goal blood pressure <130/80 - Reviewed appropriate administration of medication regimen - Counseled on long term microvascular and macrovascular complications of uncontrolled hypertension - Reviewed that uncontrolled hypertension and diabetes, even if asymptomatic, puts at risk for cardiovascular complications. Encouraged to consider strategies for adherence - Was unable to get in touch with patient after I checked on the copay for the Farxiga. Will send patient a message to suggest that we investigate if he would qualify for patient assistance. If not, I recommend d/c Farxiga as patient will not be able to afford the copay, and instead maximize metformin, using XR if needed.   Catie Hedwig Morton, PharmD, [facility] [phone]

## 2022-02-09 ENCOUNTER — Other Ambulatory Visit: Payer: Self-pay | Admitting: Internal Medicine

## 2022-02-09 DIAGNOSIS — E118 Type 2 diabetes mellitus with unspecified complications: Secondary | ICD-10-CM

## 2022-06-09 ENCOUNTER — Other Ambulatory Visit: Payer: Self-pay | Admitting: Internal Medicine

## 2022-06-09 DIAGNOSIS — I2510 Atherosclerotic heart disease of native coronary artery without angina pectoris: Secondary | ICD-10-CM

## 2022-06-17 DIAGNOSIS — E860 Dehydration: Secondary | ICD-10-CM | POA: Diagnosis not present

## 2022-09-04 ENCOUNTER — Telehealth: Payer: Self-pay | Admitting: Internal Medicine

## 2022-09-04 NOTE — Telephone Encounter (Signed)
Left message for patient to call back and schedule Medicare Annual Wellness Visit (AWV) either virtually or phone   Left  my [name] number [phone]   Last AWV 01/23/21

## 2022-12-11 ENCOUNTER — Telehealth: Payer: Self-pay | Admitting: Pharmacist

## 2022-12-11 NOTE — Telephone Encounter (Signed)
Called patient to schedule an appointment for Medication Management. Pt was identified by a [facility] report as failing his CBP (control of HTN) and medication adherence (to cholesterol, diabetes medicines) measures. I was unable to reach the patient so I left a HIPAA-compliant message requesting that the patient return my call. I also left a VM with his daughter.   Butch Penny, PharmD, [place] [place], CPP Clinical Pharmacist [facility] [phone]

## 2022-12-26 ENCOUNTER — Telehealth: Payer: Self-pay | Admitting: Internal Medicine

## 2022-12-26 NOTE — Telephone Encounter (Signed)
Copied from CRM [id number]. Topic: Medicare AWV >> Dec 26, 2022  2:40 PM Rushie Goltz wrote: Reason for CRM: Called patient to schedule Medicare Annual Wellness Visit (AWV). Left message for patient to call back and schedule Medicare Annual Wellness Visit (AWV).  Last date of AWV: 01/23/2021  Please schedule an AWVS appointment at any time with [facility] VISIT.  If any questions, please contact me at [phone].    Thank you,  [facility] Support [facility] Direct dial  [phone]

## 2023-03-19 ENCOUNTER — Telehealth: Payer: Self-pay

## 2023-03-19 NOTE — Telephone Encounter (Signed)
LVM for patient to call back [phone], or to call PCP office to schedule follow up apt. AS, CMA  

## 2023-12-18 ENCOUNTER — Telehealth: Payer: Self-pay

## 2023-12-18 NOTE — Telephone Encounter (Signed)
 Patient was identified as falling into the True [place] Measure - Diabetes.   Patient was: Left voicemail to schedule with primary care provider.
